# Patient Record
Sex: Female | Born: 1965 | Race: Black or African American | Hispanic: No | Marital: Single | Smoking: Former smoker
Health system: Southern US, Community
[De-identification: ages and names within clinical notes are randomized; demographics above are authoritative.]

## PROBLEM LIST (undated history)

## (undated) DIAGNOSIS — E785 Hyperlipidemia, unspecified: Secondary | ICD-10-CM

## (undated) DIAGNOSIS — C49A2 Gastrointestinal stromal tumor of stomach: Secondary | ICD-10-CM

## (undated) DIAGNOSIS — M545 Low back pain, unspecified: Secondary | ICD-10-CM

## (undated) DIAGNOSIS — G35 Multiple sclerosis: Secondary | ICD-10-CM

## (undated) DIAGNOSIS — C801 Malignant (primary) neoplasm, unspecified: Secondary | ICD-10-CM

## (undated) DIAGNOSIS — I10 Essential (primary) hypertension: Secondary | ICD-10-CM

## (undated) DIAGNOSIS — E559 Vitamin D deficiency, unspecified: Secondary | ICD-10-CM

## (undated) HISTORY — DX: Low back pain: M54.5

## (undated) HISTORY — DX: Low back pain, unspecified: M54.50

## (undated) HISTORY — DX: Vitamin D deficiency, unspecified: E55.9

## (undated) HISTORY — DX: Gastrointestinal stromal tumor of stomach: C49.A2

## (undated) HISTORY — DX: Hyperlipidemia, unspecified: E78.5

---

## 2010-09-02 ENCOUNTER — Other Ambulatory Visit: Payer: Self-pay | Admitting: Neurology

## 2010-09-02 DIAGNOSIS — G35 Multiple sclerosis: Secondary | ICD-10-CM

## 2011-07-11 ENCOUNTER — Other Ambulatory Visit: Payer: Self-pay | Admitting: Neurology

## 2011-07-11 DIAGNOSIS — G35 Multiple sclerosis: Secondary | ICD-10-CM

## 2011-07-22 ENCOUNTER — Ambulatory Visit
Admission: RE | Admit: 2011-07-22 | Discharge: 2011-07-22 | Disposition: A | Discharge status: Home / Self Care | Payer: Medicaid Other | Source: Ambulatory Visit | Attending: Neurology | Admitting: Neurology

## 2011-07-22 DIAGNOSIS — G35 Multiple sclerosis: Secondary | ICD-10-CM

## 2011-07-22 MED ORDER — GADOBENATE DIMEGLUMINE 529 MG/ML IV SOLN
20.0000 mL | Freq: Once | INTRAVENOUS | Status: AC | PRN
  Administered 2011-07-22: 20 mL via INTRAVENOUS

## 2012-04-08 ENCOUNTER — Other Ambulatory Visit: Payer: Self-pay

## 2012-04-08 DIAGNOSIS — Z1231 Encounter for screening mammogram for malignant neoplasm of breast: Secondary | ICD-10-CM

## 2012-05-07 ENCOUNTER — Ambulatory Visit
Admission: RE | Admit: 2012-05-07 | Discharge: 2012-05-07 | Disposition: A | Discharge status: Home / Self Care | Payer: Medicaid Other | Source: Ambulatory Visit

## 2012-05-07 DIAGNOSIS — Z1231 Encounter for screening mammogram for malignant neoplasm of breast: Secondary | ICD-10-CM

## 2012-08-20 ENCOUNTER — Other Ambulatory Visit: Payer: Self-pay | Admitting: Neurology

## 2012-08-31 ENCOUNTER — Other Ambulatory Visit (HOSPITAL_COMMUNITY): Payer: Self-pay | Admitting: Obstetrics and Gynecology

## 2012-08-31 DIAGNOSIS — N971 Female infertility of tubal origin: Secondary | ICD-10-CM

## 2012-08-31 DIAGNOSIS — Z308 Encounter for other contraceptive management: Secondary | ICD-10-CM

## 2012-08-31 DIAGNOSIS — IMO0002 Reserved for concepts with insufficient information to code with codable children: Secondary | ICD-10-CM

## 2012-09-03 ENCOUNTER — Emergency Department (HOSPITAL_COMMUNITY)
Admission: EM | Admit: 2012-09-03 | Discharge: 2012-09-03 | Discharge status: Against Medical Advice | Payer: Medicaid Other | Attending: Emergency Medicine | Admitting: Emergency Medicine

## 2012-09-03 ENCOUNTER — Inpatient Hospital Stay (HOSPITAL_COMMUNITY)
Admission: EM | Admit: 2012-09-03 | Discharge: 2012-09-13 | DRG: 981 | Disposition: A | Discharge status: Home / Self Care | Payer: Medicaid Other | Attending: Internal Medicine | Admitting: Internal Medicine

## 2012-09-03 ENCOUNTER — Emergency Department (HOSPITAL_COMMUNITY): Payer: Medicaid Other

## 2012-09-03 ENCOUNTER — Encounter (HOSPITAL_COMMUNITY): Payer: Self-pay

## 2012-09-03 ENCOUNTER — Encounter (HOSPITAL_COMMUNITY): Payer: Self-pay | Admitting: *Deleted

## 2012-09-03 DIAGNOSIS — R000 Tachycardia, unspecified: Secondary | ICD-10-CM | POA: Diagnosis present

## 2012-09-03 DIAGNOSIS — Z87891 Personal history of nicotine dependence: Secondary | ICD-10-CM

## 2012-09-03 DIAGNOSIS — R059 Cough, unspecified: Secondary | ICD-10-CM | POA: Diagnosis not present

## 2012-09-03 DIAGNOSIS — E785 Hyperlipidemia, unspecified: Secondary | ICD-10-CM | POA: Diagnosis present

## 2012-09-03 DIAGNOSIS — N179 Acute kidney failure, unspecified: Secondary | ICD-10-CM | POA: Diagnosis not present

## 2012-09-03 DIAGNOSIS — D72829 Elevated white blood cell count, unspecified: Secondary | ICD-10-CM | POA: Diagnosis present

## 2012-09-03 DIAGNOSIS — E43 Unspecified severe protein-calorie malnutrition: Secondary | ICD-10-CM | POA: Diagnosis present

## 2012-09-03 DIAGNOSIS — I10 Essential (primary) hypertension: Secondary | ICD-10-CM | POA: Insufficient documentation

## 2012-09-03 DIAGNOSIS — Z3202 Encounter for pregnancy test, result negative: Secondary | ICD-10-CM | POA: Insufficient documentation

## 2012-09-03 DIAGNOSIS — G35 Multiple sclerosis: Secondary | ICD-10-CM | POA: Insufficient documentation

## 2012-09-03 DIAGNOSIS — R55 Syncope and collapse: Secondary | ICD-10-CM | POA: Insufficient documentation

## 2012-09-03 DIAGNOSIS — K8020 Calculus of gallbladder without cholecystitis without obstruction: Secondary | ICD-10-CM | POA: Diagnosis present

## 2012-09-03 DIAGNOSIS — W1809XA Striking against other object with subsequent fall, initial encounter: Secondary | ICD-10-CM | POA: Insufficient documentation

## 2012-09-03 DIAGNOSIS — E876 Hypokalemia: Secondary | ICD-10-CM | POA: Diagnosis present

## 2012-09-03 DIAGNOSIS — Z862 Personal history of diseases of the blood and blood-forming organs and certain disorders involving the immune mechanism: Secondary | ICD-10-CM | POA: Insufficient documentation

## 2012-09-03 DIAGNOSIS — R109 Unspecified abdominal pain: Secondary | ICD-10-CM | POA: Insufficient documentation

## 2012-09-03 DIAGNOSIS — K921 Melena: Secondary | ICD-10-CM | POA: Diagnosis present

## 2012-09-03 DIAGNOSIS — Y939 Activity, unspecified: Secondary | ICD-10-CM | POA: Insufficient documentation

## 2012-09-03 DIAGNOSIS — Z8639 Personal history of other endocrine, nutritional and metabolic disease: Secondary | ICD-10-CM | POA: Insufficient documentation

## 2012-09-03 DIAGNOSIS — S0990XA Unspecified injury of head, initial encounter: Secondary | ICD-10-CM | POA: Insufficient documentation

## 2012-09-03 DIAGNOSIS — D4819 Other specified neoplasm of uncertain behavior of connective and other soft tissue: Principal | ICD-10-CM | POA: Diagnosis present

## 2012-09-03 DIAGNOSIS — D500 Iron deficiency anemia secondary to blood loss (chronic): Secondary | ICD-10-CM | POA: Diagnosis present

## 2012-09-03 DIAGNOSIS — K922 Gastrointestinal hemorrhage, unspecified: Secondary | ICD-10-CM

## 2012-09-03 DIAGNOSIS — R05 Cough: Secondary | ICD-10-CM | POA: Diagnosis not present

## 2012-09-03 DIAGNOSIS — R630 Anorexia: Secondary | ICD-10-CM | POA: Insufficient documentation

## 2012-09-03 DIAGNOSIS — R42 Dizziness and giddiness: Secondary | ICD-10-CM | POA: Insufficient documentation

## 2012-09-03 DIAGNOSIS — G35D Multiple sclerosis, unspecified: Secondary | ICD-10-CM

## 2012-09-03 DIAGNOSIS — R112 Nausea with vomiting, unspecified: Secondary | ICD-10-CM | POA: Diagnosis present

## 2012-09-03 DIAGNOSIS — K319 Disease of stomach and duodenum, unspecified: Secondary | ICD-10-CM | POA: Diagnosis present

## 2012-09-03 DIAGNOSIS — Z79899 Other long term (current) drug therapy: Secondary | ICD-10-CM | POA: Insufficient documentation

## 2012-09-03 DIAGNOSIS — K3189 Other diseases of stomach and duodenum: Secondary | ICD-10-CM | POA: Diagnosis present

## 2012-09-03 DIAGNOSIS — K259 Gastric ulcer, unspecified as acute or chronic, without hemorrhage or perforation: Secondary | ICD-10-CM | POA: Diagnosis present

## 2012-09-03 DIAGNOSIS — D481 Neoplasm of uncertain behavior of connective and other soft tissue: Principal | ICD-10-CM | POA: Diagnosis present

## 2012-09-03 DIAGNOSIS — I959 Hypotension, unspecified: Secondary | ICD-10-CM | POA: Diagnosis present

## 2012-09-03 DIAGNOSIS — Y9289 Other specified places as the place of occurrence of the external cause: Secondary | ICD-10-CM | POA: Insufficient documentation

## 2012-09-03 DIAGNOSIS — K5900 Constipation, unspecified: Secondary | ICD-10-CM | POA: Insufficient documentation

## 2012-09-03 DIAGNOSIS — D62 Acute posthemorrhagic anemia: Secondary | ICD-10-CM | POA: Diagnosis present

## 2012-09-03 DIAGNOSIS — D649 Anemia, unspecified: Secondary | ICD-10-CM | POA: Diagnosis present

## 2012-09-03 HISTORY — DX: Hyperlipidemia, unspecified: E78.5

## 2012-09-03 HISTORY — DX: Multiple sclerosis: G35

## 2012-09-03 HISTORY — DX: Essential (primary) hypertension: I10

## 2012-09-03 LAB — COMPREHENSIVE METABOLIC PANEL
ALT: 6 U/L (ref 0–35)
AST: 11 U/L (ref 0–37)
Alkaline Phosphatase: 41 U/L (ref 39–117)
CO2: 26 mEq/L (ref 19–32)
Calcium: 8.5 mg/dL (ref 8.4–10.5)
Chloride: 102 mEq/L (ref 96–112)
GFR calc non Af Amer: 62 mL/min — ABNORMAL LOW (ref >90)
Potassium: 3.6 mEq/L (ref 3.5–5.1)
Sodium: 136 mEq/L (ref 135–145)
Total Bilirubin: 0.3 mg/dL (ref 0.3–1.2)

## 2012-09-03 LAB — CBC WITH DIFFERENTIAL/PLATELET
Basophils Absolute: 0 10*3/uL (ref 0.0–0.1)
Lymphocytes Relative: 13 % (ref 12–46)
Neutro Abs: 5 10*3/uL (ref 1.7–7.7)
Platelets: 205 10*3/uL (ref 150–400)
RDW: 13 % (ref 11.5–15.5)
WBC: 6.1 10*3/uL (ref 4.0–10.5)

## 2012-09-03 LAB — URINALYSIS, DIPSTICK ONLY
Bilirubin Urine: NEGATIVE
Ketones, ur: NEGATIVE mg/dL
Nitrite: NEGATIVE
pH: 6 (ref 5.0–8.0)

## 2012-09-03 LAB — POCT PREGNANCY, URINE: Preg Test, Ur: NEGATIVE

## 2012-09-03 NOTE — ED Notes (Signed)
Pt unable urinate at time. Pt. Aware of giving urine specimen.  Nurse was notified.

## 2012-09-03 NOTE — ED Notes (Addendum)
Pt wanst to leave AMA states she is not from here and have to get home to give her son medication. She will like to leave now and come back later. PA aware. Pt aware of importance of staying for medical treatment.

## 2012-09-03 NOTE — ED Notes (Signed)
Per EMS pt had syncopal episode at grocery store today. Just prior to syncopal episode she vomited. Pt fell into her son and to the ground but does not c/o of pain from fall. Dark brown emesis noted by EMS. Waiting to see primary care doctor.

## 2012-09-03 NOTE — ED Notes (Signed)
Pt reports earlier today she walked from her apartment to the store, pt came out of the store, experienced an episode of n/v and then reports a syncopal episode w/ a +LOC for approx in duration. Pt states she fell to the ground and hit the back of her head during her syncopal episode. Pt is A&Ox4, in no acute distress at present. Pt states she was seen here at this facility at time of event and had a CT scan however had to leave to give her son medication.

## 2012-09-03 NOTE — ED Provider Notes (Signed)
CSN: [id number]     Arrival date & time 09/03/12  1717 History     None    Chief Complaint  Patient presents with  . Near Syncope    HPI  Monica [name] is a 47 year old female with a PMH of HTN, HLD, and MS who presents to the ED for evaluation of near syncope.  Patient states that at approximately 5:00 this evening she was at the grocery store when she suddenly became lightheaded. She states she felt nauseated and had a large episode of emesis. She then had a syncopal episode and fell backwards hitting the back of her head on the concrete.  She denies any injuries from her fall.  Her son was present and states that she was unconscious for approximately 1 minute.  Patient states that she is currently asymptomatic and had no lightheadedness, palpitations, dizziness, weakness, lightheadedness, numbness/tingling, headache, nausea, or abdominal pain.   She states that she has not been eating or drinking very much because every time she eats something it causes her to have increased abdominal pain.  Her abdominal pain is located in the epigastric region and is intermittent.  She also reports dark stools for the past 2 days. She states that she has been constipated and has been taking stool softeners. She states that she's been taking ibuprofen for her pain. She otherwise has been well with no recent fever, chills, rhinorrhea, congestion, sore throat, shortness of breath, chest pain, dysuria, hematuria, vaginal bleeding, vaginal discharge, or leg edema.  LNMP two weeks ago.      Past Medical History  Diagnosis Date  . Hypertension   . Hyperlipidemia   . MS (multiple sclerosis)    History reviewed. No pertinent past surgical history. No family history on file. History  Substance Use Topics  . Smoking status: Former Games developer  . Smokeless tobacco: Never Used  . Alcohol Use: No   OB History   Grav Para Term Preterm Abortions TAB SAB Ect Mult Living                 Review of Systems   Constitutional: Positive for appetite change (decreased ). Negative for fever, diaphoresis, activity change and fatigue.  HENT: Negative for ear pain, congestion, sore throat, rhinorrhea, neck pain, neck stiffness and dental problem.   Eyes: Negative for photophobia and visual disturbance.  Respiratory: Negative for cough, shortness of breath and wheezing.   Cardiovascular: Negative for chest pain, palpitations and leg swelling.  Gastrointestinal: Positive for nausea, vomiting, abdominal pain and constipation. Negative for diarrhea, blood in stool and anal bleeding.  Genitourinary: Negative for dysuria, hematuria, decreased urine volume, vaginal bleeding, vaginal discharge, genital sores and vaginal pain.  Musculoskeletal: Negative for back pain and gait problem.  Skin: Negative for rash and wound.  Neurological: Positive for syncope and light-headedness. Negative for dizziness, tremors, seizures, speech difficulty, weakness, numbness and headaches.    Allergies  Review of patient's allergies indicates no known allergies.  Home Medications   Current Outpatient Rx  Name  Route  Sig  Dispense  Refill  . glatiramer (COPAXONE) 20 MG/ML injection   Subcutaneous   Inject 1 mL (20 mg total) into the skin daily.   3 kit   1    BP 127/69  Pulse 77  Temp(Src) 98.3 F (36.8 C) (Oral)  Resp 23  SpO2 98%  LMP 08/20/2012  Filed Vitals:   09/03/12 1737 09/03/12 1738 09/03/12 1923 09/03/12 2121  BP: 116/68 113/75 138/84 165/88  Pulse: 76 95 75 80  Temp:    99 F (37.2 C)  TempSrc:    Oral  Resp: 30 26 18 16   SpO2: 100%  100% 100%     Physical Exam  Nursing note and vitals reviewed. Constitutional: She is oriented to person, place, and time. She appears well-developed and well-nourished. No distress.  HENT:  Head: Normocephalic and atraumatic.  Right Ear: External ear normal.  Left Ear: External ear normal.  Nose: Nose normal.  Mouth/Throat: Oropharynx is clear and moist. No  oropharyngeal exudate.  No palpable hematomas or lacerations to the scalp throughout.   No hemotympanum bilaterally.    Eyes: Conjunctivae and EOM are normal. Pupils are equal, round, and reactive to light. Right eye exhibits no discharge. Left eye exhibits no discharge.  Neck: Normal range of motion. Neck supple.  No tenderness to palpation to the neck throughout. No limitations of range of motion  Cardiovascular: Normal rate, regular rhythm, normal heart sounds and intact distal pulses.  Exam reveals no gallop and no friction rub.   No murmur heard. Pulmonary/Chest: Effort normal and breath sounds normal. No respiratory distress. She has no wheezes. She has no rales. She exhibits no tenderness.  Abdominal: Soft. Bowel sounds are normal. She exhibits no distension and no mass. There is tenderness. There is no rebound and no guarding.  Mild epigastric tenderness to palpation  Musculoskeletal: Normal range of motion. She exhibits no edema and no tenderness.  Neurological: She is alert and oriented to person, place, and time.  GCS 15.  No focal neurological deficits. No cranial nerve deficits. Gross sensation intact in the upper and lower extremities.  Skin: Skin is warm and dry. She is not diaphoretic.    ED Course   Procedures (including critical care time)  Labs Reviewed - No data to display No results found. 1. Syncope   2. Head injury, acute, initial encounter   3. GI bleeding     Results for orders placed during the hospital encounter of 09/03/12  GLUCOSE, CAPILLARY      Result Value Range   Glucose-Capillary 95  70 - 99 mg/dL  OCCULT BLOOD, POC DEVICE      Result Value Range   Fecal Occult Bld POSITIVE (*) NEGATIVE   Results for orders placed during the hospital encounter of 09/03/12  CBC WITH DIFFERENTIAL      Result Value Range   WBC 6.1  4.0 - 10.5 K/uL   RBC 2.66 (*) 3.87 - 5.11 MIL/uL   Hemoglobin 8.0 (*) 12.0 - 15.0 g/dL   HCT 13.0 (*) 86.5 - 78.4 %   MCV 91.4   78.0 - 100.0 fL   MCH 30.1  26.0 - 34.0 pg   MCHC 32.9  30.0 - 36.0 g/dL   RDW 69.6  29.5 - 28.4 %   Platelets 205  150 - 400 K/uL   Neutrophils Relative % 82 (*) 43 - 77 %   Neutro Abs 5.0  1.7 - 7.7 K/uL   Lymphocytes Relative 13  12 - 46 %   Lymphs Abs 0.8  0.7 - 4.0 K/uL   Monocytes Relative 5  3 - 12 %   Monocytes Absolute 0.3  0.1 - 1.0 K/uL   Eosinophils Relative 0  0 - 5 %   Eosinophils Absolute 0.0  0.0 - 0.7 K/uL   Basophils Relative 0  0 - 1 %   Basophils Absolute 0.0  0.0 - 0.1 K/uL  COMPREHENSIVE METABOLIC PANEL  Result Value Range   Sodium 136  135 - 145 mEq/L   Potassium 3.6  3.5 - 5.1 mEq/L   Chloride 102  96 - 112 mEq/L   CO2 26  19 - 32 mEq/L   Glucose, Bld 115 (*) 70 - 99 mg/dL   BUN 8  6 - 23 mg/dL   Creatinine, Ser 1.61  0.50 - 1.10 mg/dL   Calcium 8.5  8.4 - 09.6 mg/dL   Total Protein 6.5  6.0 - 8.3 g/dL   Albumin 3.4 (*) 3.5 - 5.2 g/dL   AST 11  0 - 37 U/L   ALT 6  0 - 35 U/L   Alkaline Phosphatase 41  39 - 117 U/L   Total Bilirubin 0.3  0.3 - 1.2 mg/dL   GFR calc non Af Amer 62 (*) >90 mL/min   GFR calc Af Amer 72 (*) >90 mL/min  LIPASE, BLOOD      Result Value Range   Lipase 98 (*) 11 - 59 U/L  URINALYSIS, DIPSTICK ONLY      Result Value Range   Specific Gravity, Urine 1.014  1.005 - 1.030   pH 6.0  5.0 - 8.0   Glucose, UA NEGATIVE  NEGATIVE mg/dL   Hgb urine dipstick NEGATIVE  NEGATIVE   Bilirubin Urine NEGATIVE  NEGATIVE   Ketones, ur NEGATIVE  NEGATIVE mg/dL   Protein, ur 045 (*) NEGATIVE mg/dL   Urobilinogen, UA 1.0  0.0 - 1.0 mg/dL   Nitrite NEGATIVE  NEGATIVE   Leukocytes, UA NEGATIVE  NEGATIVE  GLUCOSE, CAPILLARY      Result Value Range   Glucose-Capillary 95  70 - 99 mg/dL  OCCULT BLOOD, POC DEVICE      Result Value Range   Fecal Occult Bld POSITIVE (*) NEGATIVE  POCT PREGNANCY, URINE      Result Value Range   Preg Test, Ur NEGATIVE  NEGATIVE     Date: 09/03/2012  Rate: 77  Rhythm: normal sinus rhythm  QRS Axis:  normal  Intervals: normal  ST/T Wave abnormalities: T wave inversion in leads II, aVF, V1, V2, V3, V4,   Conduction Disutrbances:none  Narrative Interpretation:   Old EKG Reviewed: none available   MDM  Monica [name] is a 47 year old female with a PMH of HTN, HLD, and MS who presents to the ED for evaluation of near syncope.  CBC, CMP, UA, urine pregnancy, occult blood stool, lipase, blood glucose, CT head, and EKG ordered to further evaluate syncopal episode. 1L IV fluids already running from EMS.     Rechecks  7:30 PM = patient resting comfortably in bed. She states she is no dizziness, lightheadedness, or abdominal pain 9:30 PM = Spoke with patient about admission.  She does not want to be admitted because she has to take care of her son.  Spoke with patient regarding consequences about leaving and she confirmed these including death and disability.  Patient signed out AMA   Etiology of syncopal episode possibly due to a GI bleed.  Patient's blood occult stool was positive and she had evidence of anemia without known cause.  Patient remained asymptomatic throughout her emergency department visit. Her EKG showed T wave inversions with no ST elevation.  Her head CT was negative for an acute intracranial abnormality. She had no neurological deficits on exam.  Patient signed out AMA.  She is instructed to return to the emergency department if she develops any severe headache, weakness, lightheadedness, repeated vomiting, fever, severe  abdominal pain, or other concerns.  She was instructed to follow-up with her PCP as soon as possible.  She was in agreement with discharge and plan.      Final impressions: 1. Syncope  2. Head injury  3. GI bleed    Greer Ee Meline Russaw PA-C    Jillyn Ledger, PA-C 09/03/12 2145

## 2012-09-04 ENCOUNTER — Encounter (HOSPITAL_COMMUNITY): Payer: Self-pay | Admitting: Internal Medicine

## 2012-09-04 ENCOUNTER — Encounter (HOSPITAL_COMMUNITY): Admission: EM | Disposition: A | Discharge status: Home / Self Care | Payer: Self-pay | Source: Home / Self Care

## 2012-09-04 ENCOUNTER — Inpatient Hospital Stay (HOSPITAL_COMMUNITY): Payer: Medicaid Other

## 2012-09-04 DIAGNOSIS — G35D Multiple sclerosis, unspecified: Secondary | ICD-10-CM

## 2012-09-04 DIAGNOSIS — R55 Syncope and collapse: Secondary | ICD-10-CM

## 2012-09-04 DIAGNOSIS — K319 Disease of stomach and duodenum, unspecified: Secondary | ICD-10-CM

## 2012-09-04 DIAGNOSIS — K259 Gastric ulcer, unspecified as acute or chronic, without hemorrhage or perforation: Secondary | ICD-10-CM

## 2012-09-04 DIAGNOSIS — I10 Essential (primary) hypertension: Secondary | ICD-10-CM

## 2012-09-04 DIAGNOSIS — K922 Gastrointestinal hemorrhage, unspecified: Secondary | ICD-10-CM | POA: Diagnosis present

## 2012-09-04 DIAGNOSIS — E785 Hyperlipidemia, unspecified: Secondary | ICD-10-CM | POA: Diagnosis present

## 2012-09-04 DIAGNOSIS — D649 Anemia, unspecified: Secondary | ICD-10-CM | POA: Diagnosis present

## 2012-09-04 DIAGNOSIS — D62 Acute posthemorrhagic anemia: Secondary | ICD-10-CM | POA: Diagnosis present

## 2012-09-04 DIAGNOSIS — Z8669 Personal history of other diseases of the nervous system and sense organs: Secondary | ICD-10-CM

## 2012-09-04 DIAGNOSIS — K3189 Other diseases of stomach and duodenum: Secondary | ICD-10-CM | POA: Diagnosis present

## 2012-09-04 HISTORY — PX: ESOPHAGOGASTRODUODENOSCOPY: SHX5428

## 2012-09-04 LAB — CBC
HCT: 21.7 % — ABNORMAL LOW (ref 36.0–46.0)
MCH: 29.1 pg (ref 26.0–34.0)
MCH: 29.5 pg (ref 26.0–34.0)
MCHC: 32.3 g/dL (ref 30.0–36.0)
MCHC: 32.4 g/dL (ref 30.0–36.0)
MCV: 90.4 fL (ref 78.0–100.0)
MCV: 90.8 fL (ref 78.0–100.0)
Platelets: 204 10*3/uL (ref 150–400)
Platelets: 198 10*3/uL (ref 150–400)
RBC: 2.61 MIL/uL — ABNORMAL LOW (ref 3.87–5.11)
RBC: 2.44 MIL/uL — ABNORMAL LOW (ref 3.87–5.11)
RDW: 13 % (ref 11.5–15.5)

## 2012-09-04 LAB — PREPARE RBC (CROSSMATCH)

## 2012-09-04 LAB — ABO/RH: ABO/RH(D): O POS

## 2012-09-04 LAB — BASIC METABOLIC PANEL
Calcium: 8.4 mg/dL (ref 8.4–10.5)
GFR calc non Af Amer: 75 mL/min — ABNORMAL LOW (ref >90)
Sodium: 138 mEq/L (ref 135–145)

## 2012-09-04 LAB — GLUCOSE, CAPILLARY: Glucose-Capillary: 115 mg/dL — ABNORMAL HIGH (ref 70–99)

## 2012-09-04 LAB — HEPATIC FUNCTION PANEL
Albumin: 3 g/dL — ABNORMAL LOW (ref 3.5–5.2)
Total Protein: 5.6 g/dL — ABNORMAL LOW (ref 6.0–8.3)

## 2012-09-04 SURGERY — EGD (ESOPHAGOGASTRODUODENOSCOPY)
Anesthesia: Moderate Sedation

## 2012-09-04 MED ORDER — SODIUM CHLORIDE 0.9 % IJ SOLN
3.0000 mL | Freq: Two times a day (BID) | INTRAMUSCULAR | Status: DC
  Administered 2012-09-04 – 2012-09-12 (×11): 3 mL via INTRAVENOUS

## 2012-09-04 MED ORDER — IOHEXOL 300 MG/ML  SOLN
50.0000 mL | Freq: Once | INTRAMUSCULAR | Status: AC | PRN
  Administered 2012-09-04: 50 mL via ORAL

## 2012-09-04 MED ORDER — ONDANSETRON HCL 4 MG PO TABS
4.0000 mg | ORAL_TABLET | Freq: Four times a day (QID) | ORAL | Status: DC | PRN
  Filled 2012-09-04: qty 1

## 2012-09-04 MED ORDER — PANTOPRAZOLE SODIUM 40 MG IV SOLR
40.0000 mg | Freq: Two times a day (BID) | INTRAVENOUS | Status: DC
  Administered 2012-09-04 – 2012-09-12 (×16): 40 mg via INTRAVENOUS
  Filled 2012-09-04 (×20): qty 40

## 2012-09-04 MED ORDER — SODIUM CHLORIDE 0.9 % IV SOLN
80.0000 mg | Freq: Once | INTRAVENOUS | Status: AC
  Administered 2012-09-04: 80 mg via INTRAVENOUS
  Filled 2012-09-04: qty 80

## 2012-09-04 MED ORDER — IOHEXOL 300 MG/ML  SOLN
100.0000 mL | Freq: Once | INTRAMUSCULAR | Status: AC | PRN
  Administered 2012-09-04: 100 mL via INTRAVENOUS

## 2012-09-04 MED ORDER — HYDRALAZINE HCL 20 MG/ML IJ SOLN: 10.0000 mg | INTRAMUSCULAR | Status: DC | PRN

## 2012-09-04 MED ORDER — BUTAMBEN-TETRACAINE-BENZOCAINE 2-2-14 % EX AERO
INHALATION_SPRAY | CUTANEOUS | Status: DC | PRN
  Administered 2012-09-04: 1 via TOPICAL

## 2012-09-04 MED ORDER — SIMVASTATIN 10 MG PO TABS
10.0000 mg | ORAL_TABLET | Freq: Every day | ORAL | Status: DC
  Administered 2012-09-04 – 2012-09-06 (×3): 10 mg via ORAL
  Filled 2012-09-04 (×4): qty 1

## 2012-09-04 MED ORDER — FENTANYL CITRATE 0.05 MG/ML IJ SOLN
INTRAMUSCULAR | Status: DC | PRN
  Administered 2012-09-04 (×3): 25 ug via INTRAVENOUS

## 2012-09-04 MED ORDER — MIDAZOLAM HCL 10 MG/2ML IJ SOLN
INTRAMUSCULAR | Status: DC | PRN
  Administered 2012-09-04 (×3): 2 mg via INTRAVENOUS

## 2012-09-04 MED ORDER — ONDANSETRON HCL 4 MG/2ML IJ SOLN
4.0000 mg | Freq: Four times a day (QID) | INTRAMUSCULAR | Status: DC | PRN
  Administered 2012-09-05 – 2012-09-06 (×2): 4 mg via INTRAVENOUS
  Filled 2012-09-04 (×3): qty 2

## 2012-09-04 MED ORDER — SODIUM CHLORIDE 0.9 % IV SOLN
INTRAVENOUS | Status: DC
  Administered 2012-09-05 – 2012-09-07 (×2): via INTRAVENOUS

## 2012-09-04 MED ORDER — PANTOPRAZOLE SODIUM 40 MG IV SOLR
8.0000 mg/h | INTRAVENOUS | Status: DC
  Administered 2012-09-04 (×2): 8 mg/h via INTRAVENOUS
  Filled 2012-09-04 (×4): qty 80

## 2012-09-04 MED ORDER — GLATIRAMER ACETATE 20 MG/ML ~~LOC~~ KIT: 20.0000 mg | PACK | Freq: Every day | SUBCUTANEOUS | Status: DC

## 2012-09-04 MED ORDER — SODIUM CHLORIDE 0.9 % IV SOLN
INTRAVENOUS | Status: AC
  Administered 2012-09-04 (×2): via INTRAVENOUS

## 2012-09-04 MED ORDER — PANTOPRAZOLE SODIUM 40 MG IV SOLR: 40.0000 mg | Freq: Two times a day (BID) | INTRAVENOUS | Status: DC

## 2012-09-04 NOTE — Op Note (Signed)
[facility] [address] [place], [address]   ENDOSCOPY PROCEDURE REPORT  PATIENT: [name], [name]  MR#: [id number] BIRTHDATE: [birth year] , 46  yrs. old GENDER: Female ENDOSCOPIST: Beverley Fiedler, MD REFERRED BY:  [facility] PROCEDURE DATE:  09/04/2012 PROCEDURE:  EGD, diagnostic ASA CLASS:     Class II INDICATIONS:  abdominal pain in upper left quadrant.   Acute post hemorrhagic anemia.   abnormal ultrasound of the GI tract,. Melena. MEDICATIONS: These medications were titrated to patient response per physician's verbal order, Fentanyl 75 mcg IV, and Versed 5 mg IV TOPICAL ANESTHETIC: Cetacaine Spray  DESCRIPTION OF PROCEDURE: After the risks benefits and alternatives of the procedure were thoroughly explained, informed consent was obtained.  The Pentax Gastroscope [id number] endoscope was introduced through the mouth and advanced to the second portion of the duodenum. Without limitations.  The instrument was slowly withdrawn as the mucosa was fully examined.      ESOPHAGUS: The mucosa of the esophagus appeared normal.  STOMACH: A large submucosal mass (vs. extrinsic compression) was seen in the body of the stomach.  Query GIST. There is an ulcerated portion to this mass in the gastric body.  There was no visible vessel but oozing around the edges with contact.  This is felt to be the source of blood loss.  This mass causes significant luminal narrowing in the mid stomach.  The distal stomach/antrum appears normal. There was no blood or heme present on entering the stomach.   DUODENUM: The duodenal mucosa showed no abnormalities in the bulb and second portion of the duodenum.  Retroflexed views revealed as previously described.     The scope was then withdrawn from the patient and the procedure completed.  COMPLICATIONS: There were no complications . ENDOSCOPIC IMPRESSION: 1.   The mucosa of the esophagus appeared normal 2.   Gastric submucosal  mass (favored over extrinsic compression) was found in the gastric body with ulceration at the center (most likely source of bleeding/anemia) 3.   The duodenal mucosa showed no abnormalities in the bulb and second portion of the duodenum  RECOMMENDATIONS: 1.  Contrast-enhanced CT scan of the abdomen and pelvis for further characterization of upper abdominal mass 2.  Twice a day IV PPI 3.  Clear liquid diet for now 4.  Surgical consultation, as endoscopic therapy very unlikely to be beneficial in this case 5.  Closely monitor hemoglobin, transfuse if necessary  eSigned:  Beverley Fiedler, MD 09/04/2012 4:33 PM   CC:The Patient  PATIENT NAME:  Monica [name], Monica [name] MR#: [id number]

## 2012-09-04 NOTE — ED Provider Notes (Signed)
Medical screening examination/treatment/procedure(s) were performed by non-physician practitioner and as supervising physician I was immediately available for consultation/collaboration.   Michale Emmerich E Jemma Rasp, MD 09/04/12 1025 

## 2012-09-04 NOTE — Progress Notes (Signed)
[facility] PROGRESS NOTE  Monica [name] WUJ:[id number] DOB: 1965-07-27 DOA: 09/03/2012 PCP: No primary provider on file.  Brief narrative: 47 y.o. female with history of multiple sclerosis, hypertension and hyperlipidemia who presented to [facility] ED status post syncopal event on the day of the admission. Patient reported not having similar events in past. She reported having nausea and non bloody vomiting associated with dark stool.for past 1-2 days prior to this admission  Pt reported she does not take aspirin but occasionally takes ibuprofen.  In ED, pt remained hemodynamically stable. CBC revealed hemoglobin of 8 which trended down to 7. GI consulted for possible EGD and/or colonoscopy.  Assessment/Plan:  Principal Problem:   Syncope - likely secondary to upper GI bleed - pt feels better this morning - no acute intracranial findings on CT head - continue supportive care with IV fluids, NS @ 75 cc/hr Active Problems:   Acute blood loss anemia - likely due to upper GI bleed - will start 2 units PRBC - appreciate GI consult - will continue protonix drip until seen by GI - keep NPO   History of multiple sclerosis - on glatiramer   HTN (hypertension) - hydralazine PRN   Hyperlipidemia - continue simvastatin   Code Status: full code Family Communication: no family at the bedside Disposition Plan: home when stable   Manson Passey, MD  [facility] Pager [phone]  If 7PM-7AM, please contact night-coverage [email or web address] Password [email or web address] 09/04/2012, 6:38 AM   LOS: 1 day   Consultants:  Gastroenterology   Procedures:  None   Antibiotics:  None   HPI/Subjective: No acute overnight events.  Objective: Filed Vitals:   09/03/12 2308 09/04/12 0221 09/04/12 0247 09/04/12 0521  BP: 138/89 144/93 148/85 142/88  Pulse: 75 64 67 65  Temp: 99.1 F (37.3 C) 98.1 F (36.7 C) 97.6 F (36.4 C) 98.2 F (36.8 C)  TempSrc: Oral Oral Oral Oral  Resp: 18 18 20 16   Height:   5\' 8"   (1.727 m)   Weight:   95.3 kg (210 lb 1.6 oz)   SpO2: 95% 100% 100% 100%    Intake/Output Summary (Last 24 hours) at 09/04/12 8657 Last data filed at 09/04/12 0449  Gross per 24 hour  Intake      0 ml  Output    500 ml  Net   -500 ml    Exam:   General:  Pt is alert, follows commands appropriately, not in acute distress  Cardiovascular: Regular rate and rhythm, S1/S2, no murmurs, no rubs, no gallops  Respiratory: Clear to auscultation bilaterally, no wheezing, no crackles, no rhonchi  Abdomen: Soft, non tender, non distended, bowel sounds present, no guarding  Extremities: No edema, pulses DP and PT palpable bilaterally  Neuro: Grossly nonfocal  Data Reviewed: Basic Metabolic Panel:  Recent Labs Lab 09/03/12 1918  NA 136  K 3.6  CL 102  CO2 26  GLUCOSE 115*  BUN 8  CREATININE 1.07  CALCIUM 8.5   Liver Function Tests:  Recent Labs Lab 09/03/12 1918  AST 11  ALT 6  ALKPHOS 41  BILITOT 0.3  PROT 6.5  ALBUMIN 3.4*    Recent Labs Lab 09/03/12 1918  LIPASE 98*   No results found for this basename: AMMONIA,  in the last 168 hours CBC:  Recent Labs Lab 09/03/12 1918 09/04/12 0215  WBC 6.1 5.2  NEUTROABS 5.0  --   HGB 8.0* 7.6*  HCT 24.3* 23.6*  MCV 91.4 90.4  PLT 205 204   Cardiac  Enzymes: No results found for this basename: CKTOTAL, CKMB, CKMBINDEX, TROPONINI,  in the last 168 hours BNP: No components found with this basename: POCBNP,  CBG:  Recent Labs Lab 09/03/12 1817  GLUCAP 95    No results found for this or any previous visit (from the past 240 hour(s)).   Studies: Ct Head Wo Contrast 09/03/2012   * IMPRESSION:  1.  No acute intracranial abnormality. 2.  White matter changes in the cerebral hemispheres as noted on the prior MR examination, consistent with the given history of multiple sclerosis.   Original Report Authenticated By: Hulan Saas, M.D.    Scheduled Meds: . glatiramer  20 mg Subcutaneous Daily  . [START ON  09/07/2012] pantoprazole (PROTONIX) IV  40 mg Intravenous Q12H  . sodium chloride  3 mL Intravenous Q12H   Continuous Infusions: . sodium chloride 75 mL/hr at 09/04/12 0349  . pantoprozole (PROTONIX) infusion 8 mg/hr (09/04/12 0254)

## 2012-09-04 NOTE — H&P (Signed)
[facility] History and Physical  Monica [name] OZH:[id number] DOB: June 14, 1965 DOA: 09/03/2012  Referring physician: ER physician. PCP: No primary provider on file. primary care is in [place].  Chief Complaint: Loss of consciousness.  HPI: Monica [name] is a 47 y.o. female history of multiple sclerosis, hypertension and hyperlipidemia was shopping last evening when suddenly she was consciousness. Patient was consciousness for around a minute did not have any associated incontinence of urine or tongue bite. Patient regained consciousness spontaneously. Presented to the ER. Patient states that over the last 2-3 days she's been feeling dizzy and has been having some abdominal discomfort with nausea vomiting. Her nausea vomiting was particularly when she eats and has been having some dark-colored vomitus and dark-colored stools. Patient okay she takes ibuprofen her last time she took was a week ago. Stool for occult blood was positive and patient's hemoglobin is around 8 and baseline is not known and patient does not recall being anemic. Patient after the initial workup did sign out AMA but returned back. Presently patient is hemodynamically stable and will be admitted for further management. Patient denies any chest pain or shortness of breath.  Review of Systems: As presented in the history of presenting illness, rest negative.  Past Medical History  Diagnosis Date  . Hypertension   . Hyperlipidemia   . MS (multiple sclerosis)    History reviewed. No pertinent past surgical history. Social History:  reports that she has quit smoking. She has never used smokeless tobacco. She reports that she does not drink alcohol or use illicit drugs. Home. where does patient live-- Can do ADLs. Can patient participate in ADLs?  No Known Allergies  History reviewed. No pertinent family history.    Prior to Admission medications   Medication Sig Start Date End Date Taking? Authorizing Provider   glatiramer (COPAXONE) 20 MG/ML injection Inject 1 mL (20 mg total) into the skin daily. 08/20/12  Yes York Spaniel, MD  lisinopril (PRINIVIL,ZESTRIL) 10 MG tablet Take 10 mg by mouth daily.   Yes Historical Provider, MD  simvastatin (ZOCOR) 10 MG tablet Take 10 mg by mouth at bedtime.   Yes Historical Provider, MD   Physical Exam: Filed Vitals:   09/03/12 2308  BP: 138/89  Pulse: 75  Temp: 99.1 F (37.3 C)  TempSrc: Oral  Resp: 18  SpO2: 95%     General:  Well-developed well-nourished.  Eyes: Anicteric mild pallor.  ENT: No discharge from the ears eyes nose mouth.  Neck: No mass felt.  Cardiovascular: S1-S2 heard.  Respiratory: No rhonchi or crepitations.  Abdomen: Soft nontender bowel sounds present.  Skin: No rash.  Musculoskeletal: No edema.  Psychiatric: Appears normal.  Neurologic: Alert awake oriented to time place and person. Moves all extremities.  Labs on Admission:  Basic Metabolic Panel:  Recent Labs Lab 09/03/12 1918  NA 136  K 3.6  CL 102  CO2 26  GLUCOSE 115*  BUN 8  CREATININE 1.07  CALCIUM 8.5   Liver Function Tests:  Recent Labs Lab 09/03/12 1918  AST 11  ALT 6  ALKPHOS 41  BILITOT 0.3  PROT 6.5  ALBUMIN 3.4*    Recent Labs Lab 09/03/12 1918  LIPASE 98*   No results found for this basename: AMMONIA,  in the last 168 hours CBC:  Recent Labs Lab 09/03/12 1918  WBC 6.1  NEUTROABS 5.0  HGB 8.0*  HCT 24.3*  MCV 91.4  PLT 205   Cardiac Enzymes: No results found for this basename:  CKTOTAL, CKMB, CKMBINDEX, TROPONINI,  in the last 168 hours  BNP (last 3 results) No results found for this basename: PROBNP,  in the last 8760 hours CBG:  Recent Labs Lab 09/03/12 1817  GLUCAP 95    Radiological Exams on Admission: Ct Head Wo Contrast  09/03/2012   *RADIOLOGY REPORT*  Clinical Data: Syncope.  Nausea and vomiting.  Current history of multiple sclerosis.  CT HEAD WITHOUT CONTRAST  Technique:  Contiguous axial  images were obtained from the base of the skull through the vertex without contrast.  Comparison: MRI brain 07/22/2011.  Findings: White matter changes in the cerebral hemispheres as noted on the prior MR, consistent with the given history of multiple sclerosis.  Ventricular system normal in size appearance for age, unchanged.  No mass lesion.  No midline shift.  No acute hemorrhage or hematoma.  No extra-axial fluid collections.  No evidence of acute infarction.  No skull fracture or other focal osseous abnormality involving the skull.  Visualized paranasal sinuses, bilateral mastoid air cells, and bilateral middle ear cavities well-aerated.  IMPRESSION:  1.  No acute intracranial abnormality. 2.  White matter changes in the cerebral hemispheres as noted on the prior MR examination, consistent with the given history of multiple sclerosis.   Original Report Authenticated By: Hulan Saas, M.D.    Assessment/Plan Principal Problem:   Syncope Active Problems:   GI bleed   History of multiple sclerosis   HTN (hypertension)   Anemia   Hyperlipidemia   1. Syncope most likely secondary GI bleed - presently patient is hemodynamically stable and monitored in telemetry. Hold antihypertensives suspected GI bleed. Differential for syncope include multiple sclerosis but given the obvious stool for clubbing positive and anemic consider GI bleed as the cause of this time. 2. GI bleed - most likely upper GI. Patient has been placed on Protonix infusion. Closely follow CBC for any worsening of hemoglobin and may need transfusion at that time. Consult GI in a.m. patient is presently hemodynamically stable. 3. Nausea vomiting and abdominal discomfort - most likely secondary to peptic ulcer disease. We'll also check ultrasound of abdomen to make sure there is no abnormal gallbladder pathology. 4. Anemia probably secondary to blood loss - follow CBC. 5. Hypertension - hold antihypertensives for now. Patient will be  on when necessary IV hydralazine for systolic blood pressure more than 160. 6. History of hyperlipidemia - continue statins when patient can resume oral.    Code Status: Full code.  Family Communication: None.  Disposition Plan: Admit to inpatient.    Wisam Siefring N. [facility] Pager [phone].  If 7PM-7AM, please contact night-coverage [email or web address] Password [facility] 09/04/2012, 1:07 AM

## 2012-09-04 NOTE — ED Provider Notes (Signed)
CSN: [id number]     Arrival date & time 09/03/12  2250 History     First MD Initiated Contact with Patient 09/03/12 2328     Chief Complaint  Patient presents with  . Loss of Consciousness  . Nausea  . Emesis   (Consider location/radiation/quality/duration/timing/severity/associated sxs/prior Treatment) HPI Comments: Patient presents emergency department with chief complaint of syncopal episode. The syncopal episode was witnessed, and she was unconscious for approximately 1 minute. She was seen earlier tonight, and had to leave AMA. She stated that she had to give her some medication. She has returned to be admitted, which was the advice given by her previous provider. She states that nothing has changed since leaving. She denies being in pain, but states that she does still dizzy when she walks. She has not taken anything in the past and she left the hospital. Additionally, she states that she has had dark stools for the past 2 days.    The history is provided by the patient. No language interpreter was used.    Past Medical History  Diagnosis Date  . Hypertension   . Hyperlipidemia   . MS (multiple sclerosis)    History reviewed. No pertinent past surgical history. History reviewed. No pertinent family history. History  Substance Use Topics  . Smoking status: Former Games developer  . Smokeless tobacco: Never Used  . Alcohol Use: No   OB History   Grav Para Term Preterm Abortions TAB SAB Ect Mult Living                 Review of Systems  All other systems reviewed and are negative.    Allergies  Review of patient's allergies indicates no known allergies.  Home Medications   Current Outpatient Rx  Name  Route  Sig  Dispense  Refill  . glatiramer (COPAXONE) 20 MG/ML injection   Subcutaneous   Inject 1 mL (20 mg total) into the skin daily.   3 kit   1   . lisinopril (PRINIVIL,ZESTRIL) 10 MG tablet   Oral   Take 10 mg by mouth daily.         . simvastatin (ZOCOR)  10 MG tablet   Oral   Take 10 mg by mouth at bedtime.          BP 138/89  Pulse 75  Temp(Src) 99.1 F (37.3 C) (Oral)  Resp 18  SpO2 95%  LMP 08/20/2012 Physical Exam  Nursing note and vitals reviewed. Constitutional: She is oriented to person, place, and time. She appears well-developed and well-nourished.  HENT:  Head: Normocephalic and atraumatic.  No scalp hematomas or signs of head injury, no hemotympanum  Eyes: Conjunctivae and EOM are normal. Pupils are equal, round, and reactive to light.  Neck: Normal range of motion. Neck supple.  Cardiovascular: Normal rate and regular rhythm.  Exam reveals no gallop and no friction rub.   No murmur heard. Pulmonary/Chest: Effort normal and breath sounds normal. No respiratory distress. She has no wheezes. She has no rales. She exhibits no tenderness.  Abdominal: Soft. Bowel sounds are normal. She exhibits no distension and no mass. There is tenderness. There is no rebound and no guarding.  Epigastric abdominal tenderness, no other focal abdominal tenderness  Musculoskeletal: Normal range of motion. She exhibits no edema and no tenderness.  Moves all extremities  Neurological: She is alert and oriented to person, place, and time.  CN 3-12 intact, sensation and strength intact  Skin: Skin is warm and dry.  Psychiatric: She has a normal mood and affect. Her behavior is normal. Judgment and thought content normal.    ED Course   Procedures (including critical care time)  Labs Reviewed - No data to display Ct Head Wo Contrast  09/03/2012   *RADIOLOGY REPORT*  Clinical Data: Syncope.  Nausea and vomiting.  Current history of multiple sclerosis.  CT HEAD WITHOUT CONTRAST  Technique:  Contiguous axial images were obtained from the base of the skull through the vertex without contrast.  Comparison: MRI brain 07/22/2011.  Findings: White matter changes in the cerebral hemispheres as noted on the prior MR, consistent with the given history  of multiple sclerosis.  Ventricular system normal in size appearance for age, unchanged.  No mass lesion.  No midline shift.  No acute hemorrhage or hematoma.  No extra-axial fluid collections.  No evidence of acute infarction.  No skull fracture or other focal osseous abnormality involving the skull.  Visualized paranasal sinuses, bilateral mastoid air cells, and bilateral middle ear cavities well-aerated.  IMPRESSION:  1.  No acute intracranial abnormality. 2.  White matter changes in the cerebral hemispheres as noted on the prior MR examination, consistent with the given history of multiple sclerosis.   Original Report Authenticated By: Hulan Saas, M.D.   1. GI bleed   2. Syncope     MDM  Patient with syncopal episode, likely secondary to GI bleed. Patient was Hemoccult-positive, and has a hemoglobin of 8. She was seen earlier today, and it was decided that she be admitted, however the patient left AMA because she had to take care of some family business. She was returned to the admitted. She denies any changes in her symptoms since leaving the hospital for the first time.  Discussed the patient with Dr. Micheline Maze, who saw the patient earlier with Junius Argyle, PA-C.  Plan remains the same, to admit the patient.   Roxy Horseman, PA-C 09/04/12 0038  Medical screening examination/treatment/procedure(s) were conducted as a shared visit with non-physician practitioner(s) or resident  and myself.  I personally evaluated the patient during the encounter and agree with the findings and plan unless otherwise indicated.    Syncope with lightheaded/dizzy since leaving ER earlier today.  No stroke hx. Pt decided to be admitted.  No changes since leaving.  No bleeding since.  Hb low last visit.  Vitals unremarkable.  Mild pale skin, RRR no murmurs, lungs clear, CNs intact, coordination and gait normal. Plan for admission, pt agrees.    Enid Skeens, MD 09/04/12 [phone]

## 2012-09-04 NOTE — Consult Note (Signed)
[facility] Consultation  Referring Provider:  [facility]  Primary Care Physician:  No primary provider on file. Primary Gastroenterologist:   none      Reason for Consultation:  GI bleed            HPI:   Monica [name] is a 47 y.o. female admitted after a syncopal episode yesterday. Patient gives a 1-2 days history of nausea, vomiting, and dark stool. Presenting hgb was 9.0, down to 7.2 today. Her baseline hgb is unknown but patient doesn't recall ever being told she was anemic. Her menstrual cycles are normal. Patient gives a one month history of nausea and vomiting associated with significant weight loss. Her stools have been very dark over last couple of days but no hematemesis  Past Medical History  Diagnosis Date  . Hypertension   . Hyperlipidemia   . MS (multiple sclerosis)    FMH: uncle with colon cancer. No gastric cancer  History  Substance Use Topics  . Smoking status: Former Games developer  . Smokeless tobacco: Never Used  . Alcohol Use: No    Prior to Admission medications   Medication Sig Start Date End Date Taking? Authorizing Provider  glatiramer (COPAXONE) 20 MG/ML injection Inject 1 mL (20 mg total) into the skin daily. 08/20/12  Yes York Spaniel, MD  lisinopril (PRINIVIL,ZESTRIL) 10 MG tablet Take 10 mg by mouth daily.   Yes Historical Provider, MD  simvastatin (ZOCOR) 10 MG tablet Take 10 mg by mouth at bedtime.   Yes Historical Provider, MD    Current Facility-Administered Medications  Medication Dose Route Frequency Provider Last Rate Last Dose  . 0.9 %  sodium chloride infusion   Intravenous Continuous Eduard Clos, MD 75 mL/hr at 09/04/12 0349    . glatiramer (COPAXONE) injection 20 mg  20 mg Subcutaneous Daily Eduard Clos, MD      . hydrALAZINE (APRESOLINE) injection 10 mg  10 mg Intravenous Q4H PRN Eduard Clos, MD      . ondansetron [facility]) tablet 4 mg  4 mg Oral Q6H PRN Eduard Clos, MD       Or  .  ondansetron [facility]) injection 4 mg  4 mg Intravenous Q6H PRN Eduard Clos, MD      . pantoprazole (PROTONIX) 80 mg in sodium chloride 0.9 % 250 mL infusion  8 mg/hr Intravenous Continuous Eduard Clos, MD 25 mL/hr at 09/04/12 0254 8 mg/hr at 09/04/12 0254  . [START ON 09/07/2012] pantoprazole (PROTONIX) injection 40 mg  40 mg Intravenous Q12H Eduard Clos, MD      . simvastatin (ZOCOR) tablet 10 mg  10 mg Oral QHS Alison Murray, MD      . sodium chloride 0.9 % injection 3 mL  3 mL Intravenous Q12H Eduard Clos, MD   3 mL at 09/04/12 0300    Allergies as of 09/03/2012  . (No Known Allergies)   Review of Systems:    All systems reviewed and negative except where noted in HPI.   Physical Exam:  Vital signs in last 24 hours: Temp:  [97.6 F (36.4 C)-99.1 F (37.3 C)] 98 F (36.7 C) (08/23 1240) Pulse Rate:  [64-95] 65 (08/23 1240) Resp:  [16-30] 16 (08/23 1240) BP: (113-165)/(68-93) 139/85 mmHg (08/23 1240) SpO2:  [95 %-100 %] 100 % (08/23 0521) Weight:  [210 lb 1.6 oz (95.3 kg)] 210 lb 1.6 oz (95.3 kg) (08/23 0247) Last BM Date: 09/03/12 General:   Pleasant black female  in NAD Head:  Normocephalic and atraumatic. Eyes:   No icterus.   Conjunctiva pink. Ears:  Normal auditory acuity. Neck:  Supple; no masses felt Lungs:  Respirations even and unlabored. Lungs clear to auscultation bilaterally.   No wheezes, crackles, or rhonchi.  Heart:  Regular rate and rhythm;  murmur heard. Abdomen:  Soft, nondistended, moderate LUQ tenderness. LUQ is somewhat firm Normal bowel sounds. No appreciable masses or hepatomegaly.  Msk:  Symmetrical without gross deformities.  Extremities:  Without edema. Neurologic:  Alert and  oriented x4;  grossly normal neurologically. Skin:  Intact without significant lesions or rashes. Cervical Nodes:  No significant cervical adenopathy. Psych:  Alert and cooperative. Normal affect.  LAB RESULTS:  Recent Labs  09/04/12 0215  09/04/12 0659 09/04/12 1059  WBC 5.2 4.0 3.8*  HGB 7.6* 7.0* 7.2*  HCT 23.6* 21.7* 22.2*  PLT 204 183 198   BMET  Recent Labs  09/03/12 1918 09/04/12 0659  NA 136 138  K 3.6 3.4*  CL 102 106  CO2 26 25  GLUCOSE 115* 88  BUN 8 8  CREATININE 1.07 0.91  CALCIUM 8.5 8.4   LFT  Recent Labs  09/04/12 0659  PROT 5.6*  ALBUMIN 3.0*  AST 9  ALT 6  ALKPHOS 35*  BILITOT 0.3  BILIDIR <0.1  IBILI NOT CALCULATED    STUDIES: Ct Head Wo Contrast  09/03/2012   *RADIOLOGY REPORT*  Clinical Data: Syncope.  Nausea and vomiting.  Current history of multiple sclerosis.  CT HEAD WITHOUT CONTRAST  Technique:  Contiguous axial images were obtained from the base of the skull through the vertex without contrast.  Comparison: MRI brain 07/22/2011.  Findings: White matter changes in the cerebral hemispheres as noted on the prior MR, consistent with the given history of multiple sclerosis.  Ventricular system normal in size appearance for age, unchanged.  No mass lesion.  No midline shift.  No acute hemorrhage or hematoma.  No extra-axial fluid collections.  No evidence of acute infarction.  No skull fracture or other focal osseous abnormality involving the skull.  Visualized paranasal sinuses, bilateral mastoid air cells, and bilateral middle ear cavities well-aerated.  IMPRESSION:  1.  No acute intracranial abnormality. 2.  White matter changes in the cerebral hemispheres as noted on the prior MR examination, consistent with the given history of multiple sclerosis.   Original Report Authenticated By: Hulan Saas, M.D.   US Abdomen Complete  09/04/2012   The *RADIOLOGY REPORT*  Clinical Data:  Abdominal pain with nausea vomiting  COMPLETE ABDOMINAL ULTRASOUND  Comparison:  None.  Findings: There is a large midline mass that is hypoechoic but solid measuring 16.7 cm x 9.5 cm x 14.5 cm.  The origin of this mass is unclear on ultrasound.  Gallbladder:  Multiple shadowing stones, largest measuring 2.1  cm. No wall thickening or pericholecystic fluid.  No evidence of acute cholecystitis.  Common bile duct:  Normal in caliber.  Duct measures 4.7 mm.  No duct stones.  Liver:  Mildly heterogeneous in echotexture.  The liver is normal in size.  No mass or focal lesion.  IVC:  Appears normal.  Pancreas:  Non visualized due to overlying bowel gas.  Spleen:  Normal in size echogenicity measuring 6.4 cm  Right Kidney:  Multiple right renal cysts.  The largest lies at the mid pole measuring 3.3 cm.  No solid renal masses, no stones and no hydronephrosis.  The right kidney measures 11.4 cm in length.  Left Kidney:  Small mid pole cyst measuring 7 mm.  No other renal masses, no stones and no hydronephrosis.  Left kidney measures 11.6 cm  Abdominal aorta:  No aneurysm identified.  IMPRESSION: There is a large midline solid abdominal mass.  Recommend follow-up contrast enhanced CT scan of the abdomen pelvis for further evaluation.  No acute findings.  Cholelithiasis without evidence of acute cholecystitis.   Original Report Authenticated By: Amie Portland, M.D.   PREVIOUS ENDOSCOPIES:            none   Impression / Plan:   56. 47 year old female with GI bleed manifested by dark, heme positive stools. Interesting her BUN is normal and she denies hematemesis.  Rule out PUD, neoplasm, severe erosive disease from vomiting. Patient has had nausea and vomiting over the last month associated with significant weight loss. There is a solid abdominal mass on u/s, wonder if this is causing extrinsic compression on her stomach?  Will proceed with EGD today for evaluation. She will likely need CTscan following EGD.   2. Multiple sclerosis. Patient on Copaxone, could this cause lymphoma (abdominal mass on u/s) ?  3. Severe normocytic anemia.  Getting blood at present for hgb of 7.2.   4. Cholelithiasis. Normal CBD. No acute cholecystitis on u/s  Thanks   LOS: 1 day   Willette Cluster  09/04/2012, 1:03 PM
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
## 2012-09-04 NOTE — Consult Note (Signed)
Patient seen, examined, and I agree with the above documentation, including the assessment and plan. Agree with EGD today to examine cause of recent bleeding/heme+ stools.   Abdominal ultrasound shows very large midline abdominal mass which is also palpable on exam. Recommend CT scan abdomen and pelvis for further characterization of this lesion Receiving red cell transfusion now Monitor H&H Further recommendations after endoscopy The nature of the procedure, as well as the risks, benefits, and alternatives were carefully and thoroughly reviewed with the patient. Ample time for discussion and questions allowed. The patient understood, was satisfied, and agreed to proceed.

## 2012-09-05 DIAGNOSIS — E43 Unspecified severe protein-calorie malnutrition: Secondary | ICD-10-CM | POA: Diagnosis present

## 2012-09-05 DIAGNOSIS — D490 Neoplasm of unspecified behavior of digestive system: Secondary | ICD-10-CM

## 2012-09-05 DIAGNOSIS — K922 Gastrointestinal hemorrhage, unspecified: Secondary | ICD-10-CM

## 2012-09-05 LAB — CBC
HCT: 27.5 % — ABNORMAL LOW (ref 36.0–46.0)
MCH: 29.9 pg (ref 26.0–34.0)
MCHC: 33.5 g/dL (ref 30.0–36.0)
RDW: 13.6 % (ref 11.5–15.5)

## 2012-09-05 LAB — GLUCOSE, CAPILLARY
Glucose-Capillary: 90 mg/dL (ref 70–99)
Glucose-Capillary: 104 mg/dL — ABNORMAL HIGH (ref 70–99)

## 2012-09-05 MED ORDER — ACETAMINOPHEN 325 MG PO TABS
650.0000 mg | ORAL_TABLET | Freq: Four times a day (QID) | ORAL | Status: DC | PRN
  Administered 2012-09-05: 650 mg via ORAL
  Filled 2012-09-05 (×2): qty 2

## 2012-09-05 MED FILL — Diphenhydramine HCl Inj 50 MG/ML: INTRAMUSCULAR | Qty: 1 | Status: AC

## 2012-09-05 NOTE — Progress Notes (Signed)
[facility] Progress Note   Subjective  Feels okay today, better than yesterday No further melena today Spoke with surgery today and plans for surgical exploration a large upper abdominal tumor, likely gastric in origin   Objective  Vital signs in last 24 hours: Temp:  [98.2 F (36.8 C)-99.2 F (37.3 C)] 99.2 F (37.3 C) (08/24 0625) Pulse Rate:  [62-93] 77 (08/24 0625) Resp:  [13-23] 16 (08/24 0625) BP: (125-148)/(62-92) 134/76 mmHg (08/24 0625) SpO2:  [97 %-100 %] 97 % (08/24 0625) Last BM Date: 09/03/12 .Gen: awake, alert, NAD HEENT: anicteric, op clear CV: RRR, no mrg Pulm: CTA b/l Abd: soft, upper abd mass with mild tenderness, ND, +BS throughout Ext: no c/c/e Neuro: nonfocal  Lab Results:  Recent Labs  09/04/12 0659 09/04/12 1059 09/05/12 0815  WBC 4.0 3.8* 6.1  HGB 7.0* 7.2* 9.2*  HCT 21.7* 22.2* 27.5*  PLT 183 198 221   BMET  Recent Labs  09/03/12 1918 09/04/12 0659  NA 136 138  K 3.6 3.4*  CL 102 106  CO2 26 25  GLUCOSE 115* 88  BUN 8 8  CREATININE 1.07 0.91  CALCIUM 8.5 8.4   LFT  Recent Labs  09/04/12 0659  PROT 5.6*  ALBUMIN 3.0*  AST 9  ALT 6  ALKPHOS 35*  BILITOT 0.3  BILIDIR <0.1  IBILI NOT CALCULATED   Studies/Results: Ct Head Wo Contrast  09/03/2012   *RADIOLOGY REPORT*  Clinical Data: Syncope.  Nausea and vomiting.  Current history of multiple sclerosis.  CT HEAD WITHOUT CONTRAST  Technique:  Contiguous axial images were obtained from the base of the skull through the vertex without contrast.  Comparison: MRI brain 07/22/2011.  Findings: White matter changes in the cerebral hemispheres as noted on the prior MR, consistent with the given history of multiple sclerosis.  Ventricular system normal in size appearance for age, unchanged.  No mass lesion.  No midline shift.  No acute hemorrhage or hematoma.  No extra-axial fluid collections.  No evidence of acute infarction.  No skull fracture or other focal osseous  abnormality involving the skull.  Visualized paranasal sinuses, bilateral mastoid air cells, and bilateral middle ear cavities well-aerated.  IMPRESSION:  1.  No acute intracranial abnormality. 2.  White matter changes in the cerebral hemispheres as noted on the prior MR examination, consistent with the given history of multiple sclerosis.   Original Report Authenticated By: Hulan Saas, M.D.   US Abdomen Complete  09/04/2012   The *RADIOLOGY REPORT*  Clinical Data:  Abdominal pain with nausea vomiting  COMPLETE ABDOMINAL ULTRASOUND  Comparison:  None.  Findings: There is a large midline mass that is hypoechoic but solid measuring 16.7 cm x 9.5 cm x 14.5 cm.  The origin of this mass is unclear on ultrasound.  Gallbladder:  Multiple shadowing stones, largest measuring 2.1 cm. No wall thickening or pericholecystic fluid.  No evidence of acute cholecystitis.  Common bile duct:  Normal in caliber.  Duct measures 4.7 mm.  No duct stones.  Liver:  Mildly heterogeneous in echotexture.  The liver is normal in size.  No mass or focal lesion.  IVC:  Appears normal.  Pancreas:  Non visualized due to overlying bowel gas.  Spleen:  Normal in size echogenicity measuring 6.4 cm  Right Kidney:  Multiple right renal cysts.  The largest lies at the mid pole measuring 3.3 cm.  No solid renal masses, no stones and no hydronephrosis.  The right kidney measures 11.4 cm in length.  Left Kidney:  Small mid pole cyst measuring 7 mm.  No other renal masses, no stones and no hydronephrosis.  Left kidney measures 11.6 cm  Abdominal aorta:  No aneurysm identified.  IMPRESSION: There is a large midline solid abdominal mass.  Recommend follow-up contrast enhanced CT scan of the abdomen pelvis for further evaluation.  No acute findings.  Cholelithiasis without evidence of acute cholecystitis.   Original Report Authenticated By: Amie Portland, M.D.   Ct Abdomen Pelvis W Contrast  09/04/2012   CLINICAL DATA:  Weakness. Fatigue. Ulcerated  gastric mass a recent endoscopy.  EXAM: CT ABDOMEN AND PELVIS WITH CONTRAST  TECHNIQUE: Multidetector CT imaging of the abdomen and pelvis was performed using the standard protocol following bolus administration of intravenous contrast.  CONTRAST:  50mL OMNIPAQUE IOHEXOL 300 MG/ML SOLN, OMNIPAQUE IOHEXOL 300 MG/ML SOLN  COMPARISON:  None.  FINDINGS: There is a large, low density, mildly heterogeneous mass anterior and inferior to the stomach and anterior to the pancreas. This is causing significant compression of the body and tail of the pancreas as well as displacement of the stomach with moderate narrowing of the lumen of the body of the stomach. This appears contiguous with a mildly thickened anterior wall of the stomach. This is also causing superior displacement and some compression of the lateral segment of the left lobe of the liver. The mass measures 15.4 x 10.4 cm on image number 35 and 13.8 cm in length on coronal image number 35.  No enlarged lymph nodes. No lung nodules are demonstrated. There is mild dependent atelectasis at both lung bases. Small amount of free peritoneal fluid in the pelvis.  Multiple bilateral renal cysts. Normal appearing adrenal glands, liver, spleen and ovaries. 2.4 cm gallstone in the gallbladder without wall thickening or pericholecystic fluid. No intestinal abnormalities. Mildly heterogeneous uterus. Mild lumbar and lower thoracic spine degenerative changes. No evidence of bony metastatic disease.  IMPRESSION: 1. 15.4 x 13.8 x 10.4 cm upper abdominal mass, as described above. This could represent a carcinoma or sarcoma arising from the stomach or pancreas. 2. No evidence of metastatic disease. 3. Small amount of free peritoneal fluid in the inferior pelvis. 4. Cholelithiasis without evidence of cholecystitis. 5. Probable minimal fibroid changes in the uterus.   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  Submucosal gastric mass -- Gen. surgery has seen the patient and plans for exploratory surgery with likely resection of large gastric tumor. 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 Would continue PPI for now given ulcerated tumor in the stomach.  Monitor hemoglobin. --Remaining plans per surgical team, please call with any questions or concerns  2.  Anemia -- improved after transfusion, monitor for recurrent bleeding Principal Problem:   Syncope Active Problems:   GI bleed   History of multiple sclerosis   HTN (hypertension)   Hyperlipidemia   Acute blood loss anemia   Submucosal lesion of stomach   Gastric ulcer     LOS: 2 days   PYRTLE, JAY M  09/05/2012, 1:09 PM

## 2012-09-05 NOTE — Progress Notes (Signed)
INITIAL NUTRITION ASSESSMENT Patient meets the criteria for severe MALNUTRITION in the context of chronic illness with 9% weight loss in 1 month and PO intake <75% of estimated needs.   DOCUMENTATION CODES Per approved criteria  -Severe malnutrition in the context of chronic illness -Obesity Unspecified   INTERVENTION: 1. Advance diet as tolerated per MD.  2. RD will monitor oral intake after surgery and add supplements as needed.   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   Goal: Patient will meet >/=90% of estimated nutrition needs  Monitor:  PO intake, weights, labs  Reason for Assessment: Malnutrition screening  47 y.o. female  Admitting Dx: Syncope  ASSESSMENT: Patient with history of multiple sclerosis, hypertension, and hyperlipidemia admitted with abdominal pain, nausea, and vomiting, especially with eating. 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 Plan for exploratory surgery with likely resection of tumor. She reports that she is tolerating the clear liquid diet, but is eating small portions. She has lost about 9% of her UBW over the last month with minimal oral intake.   Height: Ht Readings from Last 1 Encounters:  09/04/12 5\' 8"  (1.727 m)    Weight: Wt Readings from Last 1 Encounters:  09/04/12 210 lb 1.6 oz (95.3 kg)    Ideal Body Weight: 140 pounds  % Ideal Body Weight: 150%  Wt Readings from Last 10 Encounters:  09/04/12 210 lb 1.6 oz (95.3 kg)  09/04/12 210 lb 1.6 oz (95.3 kg)  09/04/12 210 lb 1.6 oz (95.3 kg)    Usual Body Weight: 230 pounds  % Usual Body Weight: 91%  BMI:  Body mass index is 31.95 kg/(m^2). Patient is obese.  Estimated Nutritional Needs: Kcal: 2000-2150 kcal Protein: 100-115 g Fluid: >2.8 L/day  Skin: Intact  Diet Order: Clear Liquid  EDUCATION NEEDS: -No education needs identified at this time   Intake/Output Summary (Last 24 hours) at  09/05/12 1444 Last data filed at 09/05/12 1249  Gross per 24 hour  Intake   2006 ml  Output   1801 ml  Net    205 ml    Last BM: 8/24   Labs:   Recent Labs Lab 09/03/12 1918 09/04/12 0659  NA 136 138  K 3.6 3.4*  CL 102 106  CO2 26 25  BUN 8 8  CREATININE 1.07 0.91  CALCIUM 8.5 8.4  GLUCOSE 115* 88    CBG (last 3)   Recent Labs  09/04/12 2345 09/05/12 0611 09/05/12 1144  GLUCAP 115* 90 101*    Scheduled Meds: . glatiramer  20 mg Subcutaneous Daily  . pantoprazole (PROTONIX) IV  40 mg Intravenous Q12H  . simvastatin  10 mg Oral QHS  . sodium chloride  3 mL Intravenous Q12H    Continuous Infusions: . sodium chloride 20 mL/hr at 09/05/12 0944    Past Medical History  Diagnosis Date  . Hypertension   . Hyperlipidemia   . MS (multiple sclerosis)     History reviewed. No pertinent past surgical history.  Linnell Fulling, RD, LDN Pager #: [phone] After-Hours Pager #: [phone]

## 2012-09-05 NOTE — Consult Note (Signed)
Reason for Consult: abdominal mass with GI bleeding Referring Physician: [name] is an 47 y.o. female.  HPI: we were asked to see this pleasant 47 year old female who was admitted following a syncopal episode. She has been found to have a significant GI bleed as a cause for her syncope. The patient states that she has been having some symptoms for about 2 months. She has noted nausea and occasional vomiting after eating. This has gradually worsened. For several weeks she has had pain in her left upper quadrant particularly after eating. She is also noted black tarry stools for about 2 weeks. After a day of shopping 2 days ago she developed lightheadedness and then a syncopal episode and was admitted. She was found to have heme positive stool. Hemoglobin was 8 and felt to 7 with rehydration. She has received 2 units of blood transfusion. She has been hemodynamically stable. Upper endoscopy was performed yesterday which I have reviewed. Showed significant external compression of the stomach with a submucosal or external mass with an overlying erosion and ulceration which appeared to be the source of bleeding without active hemorrhage. CT scan has subsequently been performed which I have personally reviewed. This shows a very large, 15 cm, low-density mass anterior and inferior to the stomach and anterior to the pancreas. It is causing partial obstruction of the stomach. The patient currently is in no distress.  Past Medical History  Diagnosis Date  . Hypertension   . Hyperlipidemia   . MS (multiple sclerosis)     History reviewed. No pertinent past surgical history.  History reviewed. No pertinent family history.  Social History:  reports that she has quit smoking. She has never used smokeless tobacco. She reports that she does not drink alcohol or use illicit drugs.  Allergies: No Known Allergies  Medications:  Prior to Admission:  Prescriptions prior to admission  Medication  Sig Dispense Refill  . glatiramer (COPAXONE) 20 MG/ML injection Inject 1 mL (20 mg total) into the skin daily.  3 kit  1  . lisinopril (PRINIVIL,ZESTRIL) 10 MG tablet Take 10 mg by mouth daily.      . simvastatin (ZOCOR) 10 MG tablet Take 10 mg by mouth at bedtime.        Results for orders placed during the hospital encounter of 09/03/12 (from the past 48 hour(s))  CBC     Status: Abnormal   Collection Time    09/04/12  2:15 AM      Result Value Range   WBC 5.2  4.0 - 10.5 K/uL   RBC 2.61 (*) 3.87 - 5.11 MIL/uL   Hemoglobin 7.6 (*) 12.0 - 15.0 g/dL   HCT 40.9 (*) 81.1 - 91.4 %   MCV 90.4  78.0 - 100.0 fL   MCH 29.1  26.0 - 34.0 pg   MCHC 32.2  30.0 - 36.0 g/dL   RDW 78.2  95.6 - 21.3 %   Platelets 204  150 - 400 K/uL  GLUCOSE, CAPILLARY     Status: None   Collection Time    09/04/12  6:52 AM      Result Value Range   Glucose-Capillary 86  70 - 99 mg/dL   Comment 1 Documented in Chart     Comment 2 Notify RN    CBC     Status: Abnormal   Collection Time    09/04/12  6:59 AM      Result Value Range   WBC 4.0  4.0 - 10.5  K/uL   RBC 2.39 (*) 3.87 - 5.11 MIL/uL   Hemoglobin 7.0 (*) 12.0 - 15.0 g/dL   HCT 16.1 (*) 09.6 - 04.5 %   MCV 90.8  78.0 - 100.0 fL   MCH 29.3  26.0 - 34.0 pg   MCHC 32.3  30.0 - 36.0 g/dL   RDW 40.9  81.1 - 91.4 %   Platelets 183  150 - 400 K/uL  BASIC METABOLIC PANEL     Status: Abnormal   Collection Time    09/04/12  6:59 AM      Result Value Range   Sodium 138  135 - 145 mEq/L   Potassium 3.4 (*) 3.5 - 5.1 mEq/L   Chloride 106  96 - 112 mEq/L   CO2 25  19 - 32 mEq/L   Glucose, Bld 88  70 - 99 mg/dL   BUN 8  6 - 23 mg/dL   Creatinine, Ser 7.82  0.50 - 1.10 mg/dL   Calcium 8.4  8.4 - 95.6 mg/dL   GFR calc non Af Amer 75 (*) >90 mL/min   GFR calc Af Amer 87 (*) >90 mL/min   Comment: (NOTE)     The eGFR has been calculated using the CKD EPI equation.     This calculation has not been validated in all clinical situations.     eGFR's  persistently <90 mL/min signify possible Chronic Kidney     Disease.  HEPATIC FUNCTION PANEL     Status: Abnormal   Collection Time    09/04/12  6:59 AM      Result Value Range   Total Protein 5.6 (*) 6.0 - 8.3 g/dL   Albumin 3.0 (*) 3.5 - 5.2 g/dL   AST 9  0 - 37 U/L   ALT 6  0 - 35 U/L   Alkaline Phosphatase 35 (*) 39 - 117 U/L   Total Bilirubin 0.3  0.3 - 1.2 mg/dL   Bilirubin, Direct <2.1  0.0 - 0.3 mg/dL   Indirect Bilirubin NOT CALCULATED  0.3 - 0.9 mg/dL  TYPE AND SCREEN     Status: None   Collection Time    09/04/12  6:59 AM      Result Value Range   ABO/RH(D) O POS     Antibody Screen NEG     Sample Expiration 09/07/2012     Unit Number [id number]     Blood Component Type RBC LR PHER2     Unit division 00     Status of Unit ISSUED,FINAL     Transfusion Status OK TO TRANSFUSE     Crossmatch Result Compatible     Unit Number [id number]     Blood Component Type RBC LR PHER2     Unit division 00     Status of Unit ISSUED,FINAL     Transfusion Status OK TO TRANSFUSE     Crossmatch Result Compatible    ABO/RH     Status: None   Collection Time    09/04/12  6:59 AM      Result Value Range   ABO/RH(D) O POS    PREPARE RBC (CROSSMATCH)     Status: None   Collection Time    09/04/12  8:56 AM      Result Value Range   Order Confirmation ORDER PROCESSED BY BLOOD BANK    CBC     Status: Abnormal   Collection Time    09/04/12 10:59 AM      Result Value Range  WBC 3.8 (*) 4.0 - 10.5 K/uL   RBC 2.44 (*) 3.87 - 5.11 MIL/uL   Hemoglobin 7.2 (*) 12.0 - 15.0 g/dL   HCT 16.1 (*) 09.6 - 04.5 %   MCV 91.0  78.0 - 100.0 fL   MCH 29.5  26.0 - 34.0 pg   MCHC 32.4  30.0 - 36.0 g/dL   RDW 40.9  81.1 - 91.4 %   Platelets 198  150 - 400 K/uL  GLUCOSE, CAPILLARY     Status: None   Collection Time    09/04/12 12:24 PM      Result Value Range   Glucose-Capillary 83  70 - 99 mg/dL  GLUCOSE, CAPILLARY     Status: None   Collection Time    09/04/12  5:11 PM      Result  Value Range   Glucose-Capillary 79  70 - 99 mg/dL  GLUCOSE, CAPILLARY     Status: Abnormal   Collection Time    09/04/12 11:45 PM      Result Value Range   Glucose-Capillary 115 (*) 70 - 99 mg/dL  GLUCOSE, CAPILLARY     Status: None   Collection Time    09/05/12  6:11 AM      Result Value Range   Glucose-Capillary 90  70 - 99 mg/dL  CBC     Status: Abnormal   Collection Time    09/05/12  8:15 AM      Result Value Range   WBC 6.1  4.0 - 10.5 K/uL   RBC 3.08 (*) 3.87 - 5.11 MIL/uL   Hemoglobin 9.2 (*) 12.0 - 15.0 g/dL   Comment: DELTA CHECK NOTED     POST TRANSFUSION SPECIMEN   HCT 27.5 (*) 36.0 - 46.0 %   MCV 89.3  78.0 - 100.0 fL   MCH 29.9  26.0 - 34.0 pg   MCHC 33.5  30.0 - 36.0 g/dL   RDW 78.2  95.6 - 21.3 %   Platelets 221  150 - 400 K/uL    Ct Head Wo Contrast  09/03/2012   *RADIOLOGY REPORT*  Clinical Data: Syncope.  Nausea and vomiting.  Current history of multiple sclerosis.  CT HEAD WITHOUT CONTRAST  Technique:  Contiguous axial images were obtained from the base of the skull through the vertex without contrast.  Comparison: MRI brain 07/22/2011.  Findings: White matter changes in the cerebral hemispheres as noted on the prior MR, consistent with the given history of multiple sclerosis.  Ventricular system normal in size appearance for age, unchanged.  No mass lesion.  No midline shift.  No acute hemorrhage or hematoma.  No extra-axial fluid collections.  No evidence of acute infarction.  No skull fracture or other focal osseous abnormality involving the skull.  Visualized paranasal sinuses, bilateral mastoid air cells, and bilateral middle ear cavities well-aerated.  IMPRESSION:  1.  No acute intracranial abnormality. 2.  White matter changes in the cerebral hemispheres as noted on the prior MR examination, consistent with the given history of multiple sclerosis.   Original Report Authenticated By: Hulan Saas, M.D.   US Abdomen Complete  09/04/2012   The *RADIOLOGY  REPORT*  Clinical Data:  Abdominal pain with nausea vomiting  COMPLETE ABDOMINAL ULTRASOUND  Comparison:  None.  Findings: There is a large midline mass that is hypoechoic but solid measuring 16.7 cm x 9.5 cm x 14.5 cm.  The origin of this mass is unclear on ultrasound.  Gallbladder:  Multiple shadowing stones, largest measuring 2.1  cm. No wall thickening or pericholecystic fluid.  No evidence of acute cholecystitis.  Common bile duct:  Normal in caliber.  Duct measures 4.7 mm.  No duct stones.  Liver:  Mildly heterogeneous in echotexture.  The liver is normal in size.  No mass or focal lesion.  IVC:  Appears normal.  Pancreas:  Non visualized due to overlying bowel gas.  Spleen:  Normal in size echogenicity measuring 6.4 cm  Right Kidney:  Multiple right renal cysts.  The largest lies at the mid pole measuring 3.3 cm.  No solid renal masses, no stones and no hydronephrosis.  The right kidney measures 11.4 cm in length.  Left Kidney:  Small mid pole cyst measuring 7 mm.  No other renal masses, no stones and no hydronephrosis.  Left kidney measures 11.6 cm  Abdominal aorta:  No aneurysm identified.  IMPRESSION: There is a large midline solid abdominal mass.  Recommend follow-up contrast enhanced CT scan of the abdomen pelvis for further evaluation.  No acute findings.  Cholelithiasis without evidence of acute cholecystitis.   Original Report Authenticated By: Amie Portland, M.D.   Ct Abdomen Pelvis W Contrast  09/04/2012   CLINICAL DATA:  Weakness. Fatigue. Ulcerated gastric mass a recent endoscopy.  EXAM: CT ABDOMEN AND PELVIS WITH CONTRAST  TECHNIQUE: Multidetector CT imaging of the abdomen and pelvis was performed using the standard protocol following bolus administration of intravenous contrast.  CONTRAST:  50mL OMNIPAQUE IOHEXOL 300 MG/ML SOLN, OMNIPAQUE IOHEXOL 300 MG/ML SOLN  COMPARISON:  None.  FINDINGS: There is a large, low density, mildly heterogeneous mass anterior and inferior to the stomach and  anterior to the pancreas. This is causing significant compression of the body and tail of the pancreas as well as displacement of the stomach with moderate narrowing of the lumen of the body of the stomach. This appears contiguous with a mildly thickened anterior wall of the stomach. This is also causing superior displacement and some compression of the lateral segment of the left lobe of the liver. The mass measures 15.4 x 10.4 cm on image number 35 and 13.8 cm in length on coronal image number 35.  No enlarged lymph nodes. No lung nodules are demonstrated. There is mild dependent atelectasis at both lung bases. Small amount of free peritoneal fluid in the pelvis.  Multiple bilateral renal cysts. Normal appearing adrenal glands, liver, spleen and ovaries. 2.4 cm gallstone in the gallbladder without wall thickening or pericholecystic fluid. No intestinal abnormalities. Mildly heterogeneous uterus. Mild lumbar and lower thoracic spine degenerative changes. No evidence of bony metastatic disease.  IMPRESSION: 1. 15.4 x 13.8 x 10.4 cm upper abdominal mass, as described above. This could represent a carcinoma or sarcoma arising from the stomach or pancreas. 2. No evidence of metastatic disease. 3. Small amount of free peritoneal fluid in the inferior pelvis. 4. Cholelithiasis without evidence of cholecystitis. 5. Probable minimal fibroid changes in the uterus.   Electronically Signed   By: Gordan Payment   On: 09/04/2012 22:11    Review of Systems  Constitutional: Positive for malaise/fatigue. Negative for fever and chills.  HENT: Negative.   Respiratory: Negative.   Cardiovascular: Negative.   Gastrointestinal: Positive for nausea, vomiting, abdominal pain, constipation and melena. Negative for diarrhea and blood in stool.  Genitourinary: Negative.   Musculoskeletal: Negative.   Neurological: Negative.        Diagnosis of multiple sclerosis but currently asymptomatic   Blood pressure 134/76, pulse 77,  temperature 99.2 F (37.3  C), temperature source Oral, resp. rate 16, height 5\' 8"  (1.727 m), weight 210 lb 1.6 oz (95.3 kg), last menstrual period 08/20/2012, SpO2 97.00%. Physical Exam General: Alert, mildly obese African American female, in no distress Skin: Warm and dry without rash or infection. HEENT: No palpable masses or thyromegaly. Sclera nonicteric. Pupils equal round and reactive. Oropharynx clear. Lymph nodes: No cervical, supraclavicular, or inguinal nodes palpable. Lungs: Breath sounds clear and equal without increased work of breathing Cardiovascular: Regular rate and rhythm without murmur. No JVD or edema. Peripheral pulses intact. Abdomen: Nondistended.  There is a large firm palpable mass occupying the entire left upper quadrant of the abdomen which is mildly tender. No palpable hernias. Extremities: No edema or joint swelling or deformity. No chronic venous stasis changes. Neurologic: Alert and fully oriented. No gross motor deficits.  Assessment/Plan: Large mass in the left upper quadrant of the abdomen with compression of the stomach and GI bleeding from mucosal erosion. 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  Other possibilities would include carcinoma of the stomach or pancreas or sarcoma. GIST would seem most likely due to the size of the mass without any apparent spread or invasion of surrounding organs. I don'[name] feel that any further workup would be indicated. I believe the next step would be exploration and hopefully resection. I discussed this with the patient. Dr. Maisie Fus will be taking over tomorrow morning and will discuss possible surgical plans with the patient.  Monica [name] 09/05/2012, 10:34 AM

## 2012-09-05 NOTE — Progress Notes (Signed)
[facility] PROGRESS NOTE  Monica [name] VHQ:[id number] DOB: 1965/09/20 DOA: 09/03/2012 PCP: No primary provider on file.  Brief narrative: 47 y.o. female with history of multiple sclerosis, hypertension and hyperlipidemia who presented to [facility] ED status post syncopal event on the day of the admission. Patient reported not having similar events in past. She reported having nausea and non bloody vomiting associated with dark stool.for past 1-2 days prior to this admission Pt reported she does not take aspirin but occasionally takes ibuprofen.  In ED, pt remained hemodynamically stable. CBC revealed hemoglobin of 8 which trended down to 7. GI consulted and pt underwent subsequent EGD with findings of gastric submucosal mass (favored over extrinsic compression)  in the gastric body with ulceration at the center (most likely source of bleeding/anemia). CT abdomen further revealed 15.4 x 13.8 x 10.4 cm upper abdominal mass which could represent a carcinoma or sarcoma arising from the stomach or pancreas with no evidence of metastatic disease. Surgery was consulted for furhter input on management.  Assessment/Plan:   Principal Problem:  Syncope  - likely secondary to upper GI bleed  - no acute intracranial findings on CT head  - continue supportive care with IV fluids, NS @ 75 cc/hr   Active Problems:  Acute blood loss anemia  - likely due to upper GI bleed secondary to upper abdominal mass seen on EGD and CT abdomen - pt has received 2 units PRBC's 09/04/2012 with appropriate rise in hemoglobin 7 --> 9.2 - status post EGD 09/04/2012 with findings of gastric submucosal mass as likely a source of bleeding - per GI continue protonix twice a day and clear liquid diet; surgery consultation recomended - aoreciate surgery consult History of multiple sclerosis  - on glatiramer  HTN (hypertension)  - hydralazine PRN  Hyperlipidemia  - continue simvastatin   Code Status: full code  Family  Communication: no family at the bedside  Disposition Plan: home when stable   Manson Passey, MD  [facility]  Pager [phone]   If 7PM-7AM, please contact night-coverage [email or web address] Password [facility] 09/05/2012, 8:22 AM   LOS: 2 days   Consultants:  GI (Dr. Rhea Belton)  Surgery (Dr. Johna Sheriff)  Procedures:  EGD 09/04/2012 with findings of gastric submucosal mass  Antibiotics:  None   HPI/Subjective: Feels little better this am.   Objective: Filed Vitals:   09/04/12 1720 09/04/12 1740 09/04/12 2057 09/05/12 0625  BP: 136/79 135/62 145/91 134/76  Pulse: 63 68 64 77  Temp: 98.9 F (37.2 C) 98.9 F (37.2 C) 98.6 F (37 C) 99.2 F (37.3 C)  TempSrc:   Oral Oral  Resp: 14 14 18 16   Height:      Weight:      SpO2:   100% 97%    Intake/Output Summary (Last 24 hours) at 09/05/12 1027 Last data filed at 09/05/12 0700  Gross per 24 hour  Intake 2511.83 ml  Output   1300 ml  Net 1211.83 ml    Exam:   General:  Pt is alert, follows commands appropriately, not in acute distress  Cardiovascular: Regular rate and rhythm, S1/S2, no murmurs, no rubs, no gallops  Respiratory: Clear to auscultation bilaterally, no wheezing, no crackles, no rhonchi  Abdomen: Soft, non tender, non distended, bowel sounds present, no guarding  Extremities: No edema, pulses DP and PT palpable bilaterally  Neuro: Grossly nonfocal  Data Reviewed: Basic Metabolic Panel:  Recent Labs Lab 09/03/12 1918 09/04/12 0659  NA 136 138  K 3.6 3.4*  CL 102  106  CO2 26 25  GLUCOSE 115* 88  BUN 8 8  CREATININE 1.07 0.91  CALCIUM 8.5 8.4   Liver Function Tests:  Recent Labs Lab 09/03/12 1918 09/04/12 0659  AST 11 9  ALT 6 6  ALKPHOS 41 35*  BILITOT 0.3 0.3  PROT 6.5 5.6*  ALBUMIN 3.4* 3.0*    Recent Labs Lab 09/03/12 1918  LIPASE 98*   No results found for this basename: AMMONIA,  in the last 168 hours CBC:  Recent Labs Lab 09/03/12 1918 09/04/12 0215 09/04/12 0659 09/04/12 1059   WBC 6.1 5.2 4.0 3.8*  NEUTROABS 5.0  --   --   --   HGB 8.0* 7.6* 7.0* 7.2*  HCT 24.3* 23.6* 21.7* 22.2*  MCV 91.4 90.4 90.8 91.0  PLT 205 204 183 198   Cardiac Enzymes: No results found for this basename: CKTOTAL, CKMB, CKMBINDEX, TROPONINI,  in the last 168 hours BNP: No components found with this basename: POCBNP,  CBG:  Recent Labs Lab 09/04/12 0652 09/04/12 1224 09/04/12 1711 09/04/12 2345 09/05/12 0611  GLUCAP 86 83 79 115* 90    No results found for this or any previous visit (from the past 240 hour(s)).   Studies: Ct Head Wo Contrast  09/03/2012   *RADIOLOGY REPORT*  Clinical Data: Syncope.  Nausea and vomiting.  Current history of multiple sclerosis.  CT HEAD WITHOUT CONTRAST  Technique:  Contiguous axial images were obtained from the base of the skull through the vertex without contrast.  Comparison: MRI brain 07/22/2011.  Findings: White matter changes in the cerebral hemispheres as noted on the prior MR, consistent with the given history of multiple sclerosis.  Ventricular system normal in size appearance for age, unchanged.  No mass lesion.  No midline shift.  No acute hemorrhage or hematoma.  No extra-axial fluid collections.  No evidence of acute infarction.  No skull fracture or other focal osseous abnormality involving the skull.  Visualized paranasal sinuses, bilateral mastoid air cells, and bilateral middle ear cavities well-aerated.  IMPRESSION:  1.  No acute intracranial abnormality. 2.  White matter changes in the cerebral hemispheres as noted on the prior MR examination, consistent with the given history of multiple sclerosis.   Original Report Authenticated By: Hulan Saas, M.D.   US Abdomen Complete  09/04/2012   The *RADIOLOGY REPORT*  Clinical Data:  Abdominal pain with nausea vomiting  COMPLETE ABDOMINAL ULTRASOUND  Comparison:  None.  Findings: There is a large midline mass that is hypoechoic but solid measuring 16.7 cm x 9.5 cm x 14.5 cm.  The origin  of this mass is unclear on ultrasound.  Gallbladder:  Multiple shadowing stones, largest measuring 2.1 cm. No wall thickening or pericholecystic fluid.  No evidence of acute cholecystitis.  Common bile duct:  Normal in caliber.  Duct measures 4.7 mm.  No duct stones.  Liver:  Mildly heterogeneous in echotexture.  The liver is normal in size.  No mass or focal lesion.  IVC:  Appears normal.  Pancreas:  Non visualized due to overlying bowel gas.  Spleen:  Normal in size echogenicity measuring 6.4 cm  Right Kidney:  Multiple right renal cysts.  The largest lies at the mid pole measuring 3.3 cm.  No solid renal masses, no stones and no hydronephrosis.  The right kidney measures 11.4 cm in length.  Left Kidney:  Small mid pole cyst measuring 7 mm.  No other renal masses, no stones and no hydronephrosis.  Left kidney measures 11.6  cm  Abdominal aorta:  No aneurysm identified.  IMPRESSION: There is a large midline solid abdominal mass.  Recommend follow-up contrast enhanced CT scan of the abdomen pelvis for further evaluation.  No acute findings.  Cholelithiasis without evidence of acute cholecystitis.   Original Report Authenticated By: Amie Portland, M.D.   Ct Abdomen Pelvis W Contrast  09/04/2012   CLINICAL DATA:  Weakness. Fatigue. Ulcerated gastric mass a recent endoscopy.  EXAM: CT ABDOMEN AND PELVIS WITH CONTRAST  TECHNIQUE: Multidetector CT imaging of the abdomen and pelvis was performed using the standard protocol following bolus administration of intravenous contrast.  CONTRAST:  50mL OMNIPAQUE IOHEXOL 300 MG/ML SOLN, OMNIPAQUE IOHEXOL 300 MG/ML SOLN  COMPARISON:  None.  FINDINGS: There is a large, low density, mildly heterogeneous mass anterior and inferior to the stomach and anterior to the pancreas. This is causing significant compression of the body and tail of the pancreas as well as displacement of the stomach with moderate narrowing of the lumen of the body of the stomach. This appears contiguous  with a mildly thickened anterior wall of the stomach. This is also causing superior displacement and some compression of the lateral segment of the left lobe of the liver. The mass measures 15.4 x 10.4 cm on image number 35 and 13.8 cm in length on coronal image number 35.  No enlarged lymph nodes. No lung nodules are demonstrated. There is mild dependent atelectasis at both lung bases. Small amount of free peritoneal fluid in the pelvis.  Multiple bilateral renal cysts. Normal appearing adrenal glands, liver, spleen and ovaries. 2.4 cm gallstone in the gallbladder without wall thickening or pericholecystic fluid. No intestinal abnormalities. Mildly heterogeneous uterus. Mild lumbar and lower thoracic spine degenerative changes. No evidence of bony metastatic disease.  IMPRESSION: 1. 15.4 x 13.8 x 10.4 cm upper abdominal mass, as described above. This could represent a carcinoma or sarcoma arising from the stomach or pancreas. 2. No evidence of metastatic disease. 3. Small amount of free peritoneal fluid in the inferior pelvis. 4. Cholelithiasis without evidence of cholecystitis. 5. Probable minimal fibroid changes in the uterus.   Electronically Signed   By: Gordan Payment   On: 09/04/2012 22:11    Scheduled Meds: . glatiramer  20 mg Subcutaneous Daily  . pantoprazole (PROTONIX) IV  40 mg Intravenous Q12H  . simvastatin  10 mg Oral QHS  . sodium chloride  3 mL Intravenous Q12H   Continuous Infusions: . sodium chloride 20 mL/hr at 09/04/12 1415

## 2012-09-06 ENCOUNTER — Encounter (HOSPITAL_COMMUNITY): Payer: Self-pay | Admitting: Internal Medicine

## 2012-09-06 DIAGNOSIS — E876 Hypokalemia: Secondary | ICD-10-CM | POA: Diagnosis present

## 2012-09-06 LAB — BASIC METABOLIC PANEL
BUN: 3 mg/dL — ABNORMAL LOW (ref 6–23)
Calcium: 8.3 mg/dL — ABNORMAL LOW (ref 8.4–10.5)
Chloride: 105 mEq/L (ref 96–112)
Creatinine, Ser: 0.89 mg/dL (ref 0.50–1.10)
GFR calc Af Amer: 89 mL/min — ABNORMAL LOW (ref >90)
GFR calc non Af Amer: 77 mL/min — ABNORMAL LOW (ref >90)

## 2012-09-06 LAB — CBC
MCH: 29.6 pg (ref 26.0–34.0)
MCHC: 32.5 g/dL (ref 30.0–36.0)
MCV: 91 fL (ref 78.0–100.0)
Platelets: 224 10*3/uL (ref 150–400)
RDW: 13.5 % (ref 11.5–15.5)
WBC: 5.3 10*3/uL (ref 4.0–10.5)

## 2012-09-06 LAB — GLUCOSE, CAPILLARY
Glucose-Capillary: 93 mg/dL (ref 70–99)
Glucose-Capillary: 86 mg/dL (ref 70–99)

## 2012-09-06 MED ORDER — CHLORHEXIDINE GLUCONATE 4 % EX LIQD
1.0000 "application " | Freq: Once | CUTANEOUS | Status: AC
  Administered 2012-09-06: 1 via TOPICAL
  Filled 2012-09-06: qty 15

## 2012-09-06 MED ORDER — POTASSIUM CHLORIDE 10 MEQ/100ML IV SOLN
10.0000 meq | INTRAVENOUS | Status: AC
  Administered 2012-09-06 (×4): 10 meq via INTRAVENOUS
  Filled 2012-09-06 (×3): qty 100

## 2012-09-06 MED ORDER — CHLORHEXIDINE GLUCONATE 4 % EX LIQD
1.0000 "application " | Freq: Once | CUTANEOUS | Status: AC
  Administered 2012-09-07: 1 via TOPICAL
  Filled 2012-09-06: qty 15

## 2012-09-06 MED ORDER — MORPHINE SULFATE 2 MG/ML IJ SOLN: 1.0000 mg | INTRAMUSCULAR | Status: DC | PRN

## 2012-09-06 NOTE — Progress Notes (Signed)
Spoke with [facility] @ Dr Maisie Fus' office, left message that patient definitely wants to procede with surgery tomorrow, child care issues resolved.

## 2012-09-06 NOTE — Care Management Note (Addendum)
    Page 1 of 1   09/07/2012     12:06:06 PM   CARE MANAGEMENT NOTE 09/07/2012  Patient:  [facility]   Account Number:  [id number]  Date Initiated:  09/06/2012  Documentation initiated by:  Lanier Clam  Subjective/Objective Assessment:   46 Y/O F ADMITTED W/SYNCOPE.GASTRIC MASS.     Action/Plan:   FROM HOME,HAS PCP,PHARMACY.HAS MENTALLY CHALLENGED SON.   Anticipated DC Date:  09/10/2012   Anticipated DC Plan:  HOME/SELF CARE      DC Planning Services  CM consult      Choice offered to / List presented to:             Status of service:  In process, will continue to follow Medicare Important Message given?   (If response is "NO", the following Medicare IM given date fields will be blank) Date Medicare IM given:   Date Additional Medicare IM given:    Discharge Disposition:    Per UR Regulation:  Reviewed for med. necessity/level of care/duration of stay  If discussed at Long Length of Stay Meetings, dates discussed:    Comments:  09/07/12 Viraaj Vorndran RN,BSN NCM [phone] SX TODAY.  09/06/12 Makeshia Seat RN,BSN NCM [phone] SX FOLLOWING-SX IN AM-GASTRIC MASS,EXPLORATORY,?RESECTION TUMOR.SEE SW NOTES-RESOURCES PROVIDED FOR PATIENT.

## 2012-09-06 NOTE — Progress Notes (Addendum)
[facility] PROGRESS NOTE  Monica [name] HYQ:[id number] DOB: 1965/06/26 DOA: 09/03/2012 PCP: No primary provider on file.  Brief narrative: 47 y.o. female with history of multiple sclerosis, hypertension and hyperlipidemia who presented to [facility] ED status post syncopal event on the day of the admission. Patient reported not having similar events in past. She reported having nausea and non bloody vomiting associated with dark stool.for past 1-2 days prior to this admission Pt reported she does not take aspirin but occasionally takes ibuprofen.  In ED, pt remained hemodynamically stable. CBC revealed hemoglobin of 8 which trended down to 7. GI consulted and pt underwent subsequent EGD with findings of gastric submucosal mass (favored over extrinsic compression) in the gastric body with ulceration at the center (most likely source of bleeding/anemia). CT abdomen further revealed 15.4 x 13.8 x 10.4 cm upper abdominal mass which could represent a carcinoma or sarcoma arising from the stomach or pancreas with no evidence of metastatic disease. Surgery was consulted for furhter input on management and plan is for surgical exploration with possible resection of the gastric tumor.  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) - pt has received 2 units PRBC's 09/04/2012 with appropriate rise in hemoglobin 7 --> 9.2  - status post EGD 09/04/2012 with findings of gastric submucosal mass as likely a source of bleeding  - per GI recommendations continue protonix twice a day  - appreciate surgery recommendations, plan for exploration and possible resection of the gastric tumor Submucosal lesion of stomach - Management as above per surgery, resection of  gastric tumor Protein-calorie malnutrition, severe - Nutrition consulted Hypokalemia - Secondary to GI losses - Repleted today History of multiple sclerosis  - on glatiramer  HTN (hypertension)  - hydralazine PRN  Hyperlipidemia  - continue simvastatin   Code Status: full code  Family Communication: no family at the bedside  Disposition Plan: home when stable   Manson Passey, MD  [facility]  Pager [phone]   Consultants:  GI (Dr. Rhea Belton)  Surgery (Dr. Johna Sheriff) Procedures:  EGD 09/04/2012 with findings of gastric submucosal mass Exploratory laparotomy 09/06/2012 Antibiotics:  None    If 7PM-7AM, please contact night-coverage [email or web address] Password [email or web address] 09/06/2012, 7:08 AM   LOS: 3 days    HPI/Subjective: No acute overnight events.  Objective: Filed Vitals:   09/04/12 2057 09/05/12 0625 09/05/12 2109 09/06/12 0528  BP: 145/91 134/76 141/90 139/75  Pulse: 64 77 66 66  Temp: 98.6 F (37 C) 99.2 F (37.3 C) 99.6 F (37.6 C) 98.9 F (37.2 C)  TempSrc: Oral Oral Oral Oral  Resp: 18 16 18 18   Height:      Weight:      SpO2: 100% 97% 98% 97%    Intake/Output Summary (Last 24 hours) at 09/06/12 0708 Last data filed at 09/06/12 0644  Gross per 24 hour  Intake 1489.33 ml  Output   2501 ml  Net -1011.67 ml    Exam:   General:  Pt is alert, follows commands appropriately, not in acute distress  Cardiovascular: Regular rate and rhythm, S1/S2, no murmurs, no rubs, no gallops  Respiratory: Clear to auscultation bilaterally, no wheezing, no crackles, no rhonchi  Abdomen: Soft, non tender, non distended, bowel sounds present,  no guarding  Extremities: No edema, pulses DP and PT palpable bilaterally  Neuro: Grossly nonfocal  Data Reviewed: Basic Metabolic Panel:  Recent Labs Lab 09/03/12 1918 09/04/12 0659 09/06/12 0435  NA 136 138 138  K 3.6 3.4* 3.1*  CL 102 106 105  CO2 26 25 24   GLUCOSE 115* 88 92  BUN 8 8 3*  CREATININE 1.07 0.91 0.89  CALCIUM  8.5 8.4 8.3*   Liver Function Tests:  Recent Labs Lab 09/03/12 1918 09/04/12 0659  AST 11 9  ALT 6 6  ALKPHOS 41 35*  BILITOT 0.3 0.3  PROT 6.5 5.6*  ALBUMIN 3.4* 3.0*    Recent Labs Lab 09/03/12 1918  LIPASE 98*   No results found for this basename: AMMONIA,  in the last 168 hours CBC:  Recent Labs Lab 09/03/12 1918 09/04/12 0215 09/04/12 0659 09/04/12 1059 09/05/12 0815 09/06/12 0435  WBC 6.1 5.2 4.0 3.8* 6.1 5.3  NEUTROABS 5.0  --   --   --   --   --   HGB 8.0* 7.6* 7.0* 7.2* 9.2* 9.5*  HCT 24.3* 23.6* 21.7* 22.2* 27.5* 29.2*  MCV 91.4 90.4 90.8 91.0 89.3 91.0  PLT 205 204 183 198 221 224   Cardiac Enzymes: No results found for this basename: CKTOTAL, CKMB, CKMBINDEX, TROPONINI,  in the last 168 hours BNP: No components found with this basename: POCBNP,  CBG:  Recent Labs Lab 09/05/12 0611 09/05/12 1144 09/05/12 1645 09/05/12 2343 09/06/12 0627  GLUCAP 90 101* 104* 90 82    No results found for this or any previous visit (from the past 240 hour(s)).   Studies: US Abdomen Complete 09/04/2012     IMPRESSION: There is a large midline solid abdominal mass.  Recommend follow-up contrast enhanced CT scan of the abdomen pelvis for further evaluation.  No acute findings.  Cholelithiasis without evidence of acute cholecystitis.   Original Report Authenticated By: Amie Portland, M.D.   Ct Abdomen Pelvis W Contrast 09/04/2012     IMPRESSION: 1. 15.4 x 13.8 x 10.4 cm upper abdominal mass, as described above. This could represent a carcinoma or sarcoma arising from the stomach or pancreas. 2. No evidence of metastatic disease. 3. Small amount of free peritoneal fluid in the inferior pelvis. 4. Cholelithiasis without evidence of cholecystitis. 5. Probable minimal fibroid changes in the uterus.   Electronically Signed   By: Gordan Payment   On: 09/04/2012 22:11    Scheduled Meds: . glatiramer  20 mg Subcutaneous Daily  . pantoprazole (PROTONIX) IV  40 mg Intravenous  Q12H  . potassium chloride  10 mEq Intravenous Q1 Hr x 3  . simvastatin  10 mg Oral QHS  . sodium chloride  3 mL Intravenous Q12H   Continuous Infusions: . sodium chloride Stopped (09/05/12 1249)

## 2012-09-06 NOTE — Progress Notes (Signed)
Syncope, UGI bleed  Subjective: HPI: we were asked to see this pleasant 47 year old female who was admitted following a syncopal episode. She has been found to have a significant GI bleed as a cause for her syncope. The patient states that she has been having some symptoms for about 2 months. She has noted nausea and occasional vomiting after eating. This has gradually worsened. For several weeks she has had pain in her left upper quadrant particularly after eating. She is also noted black tarry stools for about 2 weeks. After a day of shopping 2 days ago she developed lightheadedness and then a syncopal episode and was admitted. She was found to have heme positive stool. Hemoglobin was 8 and felt to 7 with rehydration. She has received 2 units of blood transfusion. She has been hemodynamically stable.  Upper endoscopy was performed Sat, which I have reviewed. Showed significant external compression of the stomach with a submucosal or external mass with an overlying erosion and ulceration which appeared to be the source of bleeding without active hemorrhage.  CT scan has subsequently been performed which I have personally reviewed. This shows a very large, 15 cm, low-density mass anterior and inferior to the stomach and anterior to the pancreas. It is causing partial obstruction of the stomach.  The patient currently is in no distress.  Objective: Vital signs in last 24 hours: Temp:  [98.9 F (37.2 C)-99.6 F (37.6 C)] 98.9 F (37.2 C) (08/25 0528) Pulse Rate:  [66] 66 (08/25 0528) Resp:  [18] 18 (08/25 0528) BP: (139-141)/(75-90) 139/75 mmHg (08/25 0528) SpO2:  [97 %-98 %] 97 % (08/25 0528) Last BM Date: 09/05/12  Intake/Output from previous day: 08/24 0701 - 08/25 0700 In: 1489.3 [P.O.:1320; I.V.:169.3] Out: 2501 [Urine:2500; Stool:1] Intake/Output this shift:    General appearance: alert and cooperative GI: normal findings: soft, non-tender Large mass palpated within upper abd Lab  Results:  Results for orders placed during the hospital encounter of 09/03/12 (from the past 24 hour(s))  GLUCOSE, CAPILLARY     Status: Abnormal   Collection Time    09/05/12 11:44 AM      Result Value Range   Glucose-Capillary 101 (*) 70 - 99 mg/dL  GLUCOSE, CAPILLARY     Status: Abnormal   Collection Time    09/05/12  4:45 PM      Result Value Range   Glucose-Capillary 104 (*) 70 - 99 mg/dL  GLUCOSE, CAPILLARY     Status: None   Collection Time    09/05/12 11:43 PM      Result Value Range   Glucose-Capillary 90  70 - 99 mg/dL  CBC     Status: Abnormal   Collection Time    09/06/12  4:35 AM      Result Value Range   WBC 5.3  4.0 - 10.5 K/uL   RBC 3.21 (*) 3.87 - 5.11 MIL/uL   Hemoglobin 9.5 (*) 12.0 - 15.0 g/dL   HCT 16.1 (*) 09.6 - 04.5 %   MCV 91.0  78.0 - 100.0 fL   MCH 29.6  26.0 - 34.0 pg   MCHC 32.5  30.0 - 36.0 g/dL   RDW 40.9  81.1 - 91.4 %   Platelets 224  150 - 400 K/uL  BASIC METABOLIC PANEL     Status: Abnormal   Collection Time    09/06/12  4:35 AM      Result Value Range   Sodium 138  135 - 145 mEq/L   Potassium  3.1 (*) 3.5 - 5.1 mEq/L   Chloride 105  96 - 112 mEq/L   CO2 24  19 - 32 mEq/L   Glucose, Bld 92  70 - 99 mg/dL   BUN 3 (*) 6 - 23 mg/dL   Creatinine, Ser 6.96  0.50 - 1.10 mg/dL   Calcium 8.3 (*) 8.4 - 10.5 mg/dL   GFR calc non Af Amer 77 (*) >90 mL/min   GFR calc Af Amer 89 (*) >90 mL/min  GLUCOSE, CAPILLARY     Status: None   Collection Time    09/06/12  6:27 AM      Result Value Range   Glucose-Capillary 82  70 - 99 mg/dL  SURGICAL PCR SCREEN     Status: None   Collection Time    09/06/12  6:31 AM      Result Value Range   MRSA, PCR NEGATIVE  NEGATIVE   Staphylococcus aureus NEGATIVE  NEGATIVE     Studies/Results Radiology     MEDS, Scheduled . glatiramer  20 mg Subcutaneous Daily  . pantoprazole (PROTONIX) IV  40 mg Intravenous Q12H  . potassium chloride  10 mEq Intravenous Q1 Hr x 3  . simvastatin  10 mg Oral QHS  .  sodium chloride  3 mL Intravenous Q12H     Assessment: Syncope UGI bleed Upper abd mass  Plan: We discussed possible surgical intervention, extent and possibilities of surgery, length of stay and expectations after surgery.  She is concerned about care of her disabled child at home, and I do not feel that she is ready for surgery today.  She will try to arrange care for her son today, and I will tentatively plan for surgery tomorrow.  I have ordered her some clear liquids today and to be NPO after Midnight.    LOS: 3 days    Vanita Panda, MD [facility], [place] [phone]   09/06/2012 9:53 AM

## 2012-09-07 ENCOUNTER — Encounter (HOSPITAL_COMMUNITY): Payer: Self-pay | Admitting: Anesthesiology

## 2012-09-07 ENCOUNTER — Encounter (HOSPITAL_COMMUNITY): Payer: Self-pay | Admitting: Certified Registered Nurse Anesthetist

## 2012-09-07 ENCOUNTER — Inpatient Hospital Stay (HOSPITAL_COMMUNITY): Payer: Medicaid Other | Admitting: Certified Registered Nurse Anesthetist

## 2012-09-07 ENCOUNTER — Encounter (HOSPITAL_COMMUNITY): Admission: EM | Disposition: A | Discharge status: Home / Self Care | Payer: Self-pay | Source: Home / Self Care

## 2012-09-07 DIAGNOSIS — C49A2 Gastrointestinal stromal tumor of stomach: Secondary | ICD-10-CM

## 2012-09-07 DIAGNOSIS — C169 Malignant neoplasm of stomach, unspecified: Secondary | ICD-10-CM

## 2012-09-07 HISTORY — PX: LAPAROTOMY: SHX154

## 2012-09-07 HISTORY — DX: Gastrointestinal stromal tumor of stomach: C49.A2

## 2012-09-07 HISTORY — PX: PARTIAL GASTRECTOMY: SHX6003

## 2012-09-07 LAB — GLUCOSE, CAPILLARY
Glucose-Capillary: 136 mg/dL — ABNORMAL HIGH (ref 70–99)
Glucose-Capillary: 94 mg/dL (ref 70–99)

## 2012-09-07 LAB — CBC
HCT: 28.7 % — ABNORMAL LOW (ref 36.0–46.0)
MCH: 29.5 pg (ref 26.0–34.0)
MCV: 87.2 fL (ref 78.0–100.0)
Platelets: 232 10*3/uL (ref 150–400)
RDW: 16.1 % — ABNORMAL HIGH (ref 11.5–15.5)

## 2012-09-07 SURGERY — LAPAROTOMY, EXPLORATORY
Anesthesia: General | Site: Abdomen | Wound class: Clean

## 2012-09-07 MED ORDER — GLYCOPYRROLATE 0.2 MG/ML IJ SOLN
INTRAMUSCULAR | Status: DC | PRN
  Administered 2012-09-07: 0.6 mg via INTRAVENOUS

## 2012-09-07 MED ORDER — LACTATED RINGERS IV SOLN
INTRAVENOUS | Status: DC | PRN
  Administered 2012-09-07 (×5): via INTRAVENOUS

## 2012-09-07 MED ORDER — PROPOFOL 10 MG/ML IV BOLUS
INTRAVENOUS | Status: DC | PRN
  Administered 2012-09-07: 200 mg via INTRAVENOUS

## 2012-09-07 MED ORDER — DIPHENHYDRAMINE HCL 12.5 MG/5ML PO ELIX: 12.5000 mg | ORAL_SOLUTION | Freq: Four times a day (QID) | ORAL | Status: DC | PRN

## 2012-09-07 MED ORDER — PROMETHAZINE HCL 25 MG/ML IJ SOLN: 6.2500 mg | INTRAMUSCULAR | Status: DC | PRN

## 2012-09-07 MED ORDER — LACTATED RINGERS IV SOLN
INTRAVENOUS | Status: DC
  Administered 2012-09-07: 1000 mL via INTRAVENOUS

## 2012-09-07 MED ORDER — HYDROMORPHONE HCL PF 1 MG/ML IJ SOLN
INTRAMUSCULAR | Status: AC
  Filled 2012-09-07: qty 1

## 2012-09-07 MED ORDER — FENTANYL CITRATE 0.05 MG/ML IJ SOLN
INTRAMUSCULAR | Status: DC | PRN
  Administered 2012-09-07 (×2): 50 ug via INTRAVENOUS
  Administered 2012-09-07 (×4): 100 ug via INTRAVENOUS

## 2012-09-07 MED ORDER — ENOXAPARIN SODIUM 40 MG/0.4ML ~~LOC~~ SOLN
40.0000 mg | SUBCUTANEOUS | Status: DC
  Administered 2012-09-08 – 2012-09-13 (×6): 40 mg via SUBCUTANEOUS
  Filled 2012-09-07 (×8): qty 0.4

## 2012-09-07 MED ORDER — PHENYLEPHRINE HCL 10 MG/ML IJ SOLN
INTRAMUSCULAR | Status: DC | PRN
  Administered 2012-09-07 (×3): 80 ug via INTRAVENOUS

## 2012-09-07 MED ORDER — PHENYLEPHRINE HCL 10 MG/ML IJ SOLN
20.0000 mg | INTRAVENOUS | Status: DC | PRN
  Administered 2012-09-07: 100 ug/min via INTRAVENOUS

## 2012-09-07 MED ORDER — LACTATED RINGERS IV BOLUS (SEPSIS)
1000.0000 mL | Freq: Once | INTRAVENOUS | Status: AC
  Administered 2012-09-07: 1000 mL via INTRAVENOUS

## 2012-09-07 MED ORDER — ROCURONIUM BROMIDE 100 MG/10ML IV SOLN
INTRAVENOUS | Status: DC | PRN
  Administered 2012-09-07: 20 mg via INTRAVENOUS
  Administered 2012-09-07 (×2): 10 mg via INTRAVENOUS
  Administered 2012-09-07: 50 mg via INTRAVENOUS

## 2012-09-07 MED ORDER — SUCCINYLCHOLINE CHLORIDE 20 MG/ML IJ SOLN
INTRAMUSCULAR | Status: DC | PRN
  Administered 2012-09-07: 100 mg via INTRAVENOUS

## 2012-09-07 MED ORDER — DEXTROSE 5 % IV SOLN
1.0000 g | Freq: Four times a day (QID) | INTRAVENOUS | Status: AC
  Administered 2012-09-07: 1 g via INTRAVENOUS
  Filled 2012-09-07: qty 1

## 2012-09-07 MED ORDER — LIDOCAINE HCL (CARDIAC) 20 MG/ML IV SOLN
INTRAVENOUS | Status: DC | PRN
  Administered 2012-09-07: 80 mg via INTRAVENOUS

## 2012-09-07 MED ORDER — DIPHENHYDRAMINE HCL 50 MG/ML IJ SOLN: 12.5000 mg | Freq: Four times a day (QID) | INTRAMUSCULAR | Status: DC | PRN

## 2012-09-07 MED ORDER — NEOSTIGMINE METHYLSULFATE 1 MG/ML IJ SOLN
INTRAMUSCULAR | Status: DC | PRN
  Administered 2012-09-07: 4 mg via INTRAVENOUS

## 2012-09-07 MED ORDER — LIDOCAINE HCL (CARDIAC) 20 MG/ML IV SOLN: INTRAVENOUS | Status: DC | PRN

## 2012-09-07 MED ORDER — NALOXONE HCL 0.4 MG/ML IJ SOLN: 0.4000 mg | INTRAMUSCULAR | Status: DC | PRN

## 2012-09-07 MED ORDER — SODIUM CHLORIDE 0.9 % IJ SOLN: 9.0000 mL | INTRAMUSCULAR | Status: DC | PRN

## 2012-09-07 MED ORDER — MIDAZOLAM HCL 5 MG/5ML IJ SOLN
INTRAMUSCULAR | Status: DC | PRN
  Administered 2012-09-07: 2 mg via INTRAVENOUS

## 2012-09-07 MED ORDER — ONDANSETRON HCL 4 MG/2ML IJ SOLN
INTRAMUSCULAR | Status: DC | PRN
  Administered 2012-09-07: 4 mg via INTRAVENOUS

## 2012-09-07 MED ORDER — LACTATED RINGERS IV SOLN
INTRAVENOUS | Status: DC
  Administered 2012-09-07 – 2012-09-08 (×3): via INTRAVENOUS

## 2012-09-07 MED ORDER — DEXTROSE 5 % IV SOLN
2.0000 g | INTRAVENOUS | Status: AC
  Administered 2012-09-07: 2 g via INTRAVENOUS

## 2012-09-07 MED ORDER — MORPHINE SULFATE (PF) 1 MG/ML IV SOLN
INTRAVENOUS | Status: DC
  Administered 2012-09-07: 15:00:00 via INTRAVENOUS
  Administered 2012-09-07: 4.5 mg via INTRAVENOUS
  Administered 2012-09-08: 9.59 mg via INTRAVENOUS
  Administered 2012-09-08 (×2): via INTRAVENOUS
  Administered 2012-09-08: 25.5 mg via INTRAVENOUS
  Administered 2012-09-08: 6 mg via INTRAVENOUS
  Administered 2012-09-08: 4.5 mg via INTRAVENOUS
  Administered 2012-09-08: 9 mg via INTRAVENOUS
  Administered 2012-09-08: 5.92 mg via INTRAVENOUS
  Administered 2012-09-08: 23:00:00 via INTRAVENOUS
  Administered 2012-09-08: 9 mg via INTRAVENOUS
  Administered 2012-09-09: 12:00:00 via INTRAVENOUS
  Administered 2012-09-09: 7.5 mg via INTRAVENOUS
  Administered 2012-09-09: 17.23 mg via INTRAVENOUS
  Administered 2012-09-09: 22:00:00 via INTRAVENOUS
  Administered 2012-09-09: 1.5 mg via INTRAVENOUS
  Administered 2012-09-09: 9.98 mg via INTRAVENOUS
  Administered 2012-09-09: 3 mg via INTRAVENOUS
  Administered 2012-09-10: 1.5 mg via INTRAVENOUS
  Administered 2012-09-10: 12 mg via INTRAVENOUS
  Administered 2012-09-10: 17:00:00 via INTRAVENOUS
  Administered 2012-09-10 (×2): 3 mg via INTRAVENOUS
  Administered 2012-09-11: 1.5 mg via INTRAVENOUS
  Administered 2012-09-11 (×3): 3 mg via INTRAVENOUS
  Administered 2012-09-12: 4.5 mg via INTRAVENOUS
  Administered 2012-09-12: 6 mg via INTRAVENOUS
  Administered 2012-09-12: 08:00:00 via INTRAVENOUS
  Filled 2012-09-07 (×7): qty 25

## 2012-09-07 MED ORDER — ONDANSETRON HCL 4 MG/2ML IJ SOLN: 4.0000 mg | Freq: Four times a day (QID) | INTRAMUSCULAR | Status: DC | PRN

## 2012-09-07 MED ORDER — HYDROMORPHONE HCL PF 1 MG/ML IJ SOLN
0.2500 mg | INTRAMUSCULAR | Status: DC | PRN
  Administered 2012-09-07 (×4): 0.5 mg via INTRAVENOUS

## 2012-09-07 SURGICAL SUPPLY — 56 items
BLADE EXTENDED COATED 6.5IN (ELECTRODE) ×2 IMPLANT
BLADE HEX COATED 2.75 (ELECTRODE) ×2 IMPLANT
BLADE SURG SZ10 CARB STEEL (BLADE) IMPLANT
CANISTER SUCTION 2500CC (MISCELLANEOUS) ×2 IMPLANT
CHLORAPREP W/TINT 26ML (MISCELLANEOUS) ×4 IMPLANT
CLIP TI LARGE 6 (CLIP) ×4 IMPLANT
CLOTH BEACON ORANGE TIMEOUT ST (SAFETY) ×2 IMPLANT
COVER MAYO STAND STRL (DRAPES) ×2 IMPLANT
DRAIN CHANNEL 19F RND (DRAIN) ×2 IMPLANT
DRAPE LAPAROSCOPIC ABDOMINAL (DRAPES) ×2 IMPLANT
DRAPE LG THREE QUARTER DISP (DRAPES) ×2 IMPLANT
DRAPE UTILITY XL STRL (DRAPES) ×2 IMPLANT
DRAPE WARM FLUID 44X44 (DRAPE) ×2 IMPLANT
DRSG PAD ABDOMINAL 8X10 ST (GAUZE/BANDAGES/DRESSINGS) ×2 IMPLANT
ELECT REM PT RETURN 9FT ADLT (ELECTROSURGICAL) ×2
ELECTRODE REM PT RTRN 9FT ADLT (ELECTROSURGICAL) ×1 IMPLANT
EVACUATOR 1/8 PVC DRAIN (DRAIN) ×2 IMPLANT
GLOVE BIO SURGEON STRL SZ 6.5 (GLOVE) ×6 IMPLANT
GLOVE BIOGEL PI IND STRL 7.0 (GLOVE) ×1 IMPLANT
GLOVE BIOGEL PI INDICATOR 7.0 (GLOVE) ×1
GOWN BRE IMP PREV XXLGXLNG (GOWN DISPOSABLE) ×2 IMPLANT
GOWN STRL REIN XL XLG (GOWN DISPOSABLE) ×4 IMPLANT
KIT BASIN OR (CUSTOM PROCEDURE TRAY) ×2 IMPLANT
LEGGING LITHOTOMY PAIR STRL (DRAPES) IMPLANT
LIGASURE IMPACT 36 18CM CVD LR (INSTRUMENTS) IMPLANT
NS IRRIG 1000ML POUR BTL (IV SOLUTION) ×4 IMPLANT
PACK GENERAL/GYN (CUSTOM PROCEDURE TRAY) ×2 IMPLANT
RELOAD PROXIMATE 75MM GREEN (ENDOMECHANICALS) ×6 IMPLANT
RELOAD STAPLER LINEAR PROX 30 (STAPLE) ×1 IMPLANT
SCALPEL HARMONIC ACE (MISCELLANEOUS) IMPLANT
SLEEVE SURGEON STRL (DRAPES) ×2 IMPLANT
SPONGE GAUZE 4X4 12PLY (GAUZE/BANDAGES/DRESSINGS) ×2 IMPLANT
SPONGE LAP 18X18 X RAY DECT (DISPOSABLE) ×16 IMPLANT
STAPLER PROXIMAT 75MM THCK GRN (MISCELLANEOUS) ×2 IMPLANT
STAPLER RELOAD LINEAR PROX 30 (STAPLE) ×2
STAPLER VISISTAT 35W (STAPLE) ×2 IMPLANT
SUCTION POOLE TIP (SUCTIONS) ×2 IMPLANT
SUT NOV 1 T60/GS (SUTURE) IMPLANT
SUT NOVA NAB GS-21 0 18 T12 DT (SUTURE) ×2 IMPLANT
SUT NOVA NAB GS-22 2 0 T19 (SUTURE) ×2 IMPLANT
SUT PDS AB 1 CTX 36 (SUTURE) IMPLANT
SUT PDS AB 1 TP1 96 (SUTURE) IMPLANT
SUT SILK 2 0 (SUTURE) ×2
SUT SILK 2 0 SH CR/8 (SUTURE) ×4 IMPLANT
SUT SILK 2 0SH CR/8 30 (SUTURE) ×2 IMPLANT
SUT SILK 2-0 18XBRD TIE 12 (SUTURE) ×1 IMPLANT
SUT SILK 2-0 30XBRD TIE 12 (SUTURE) ×1 IMPLANT
SUT SILK 3 0 (SUTURE)
SUT SILK 3 0 SH CR/8 (SUTURE) ×2 IMPLANT
SUT SILK 3-0 18XBRD TIE 12 (SUTURE) IMPLANT
SUT VIC AB 3-0 54XBRD REEL (SUTURE) IMPLANT
SUT VIC AB 3-0 BRD 54 (SUTURE)
TAPE CLOTH SURG 4X10 WHT LF (GAUZE/BANDAGES/DRESSINGS) ×2 IMPLANT
TOWEL OR 17X26 10 PK STRL BLUE (TOWEL DISPOSABLE) ×4 IMPLANT
TRAY FOLEY CATH 14FRSI W/METER (CATHETERS) ×2 IMPLANT
YANKAUER SUCT BULB TIP NO VENT (SUCTIONS) ×2 IMPLANT

## 2012-09-07 NOTE — Op Note (Signed)
09/03/2012 - 09/07/2012  2:58 PM  PATIENT:  Monica [name]  47 y.o. female  Patient has no care team.  PRE-OPERATIVE DIAGNOSIS:  abdominal mass, anemia  POST-OPERATIVE DIAGNOSIS:  abdominal mass, anemia  PROCEDURE:  EXPLORATORY LAPAROTOMY PARTIAL GASTRECTOMY/RESECTION OF ABDOMINAL MASS  SURGEON:  Surgeon(s): Adolph Pollack, MD Romie Levee, MD  ASSISTANT: Abbey Chatters   ANESTHESIA:   general  EBL:  Total I/O In: 5056 [I.V.:4500; Blood:556] Out: 1975 [Urine:475; Blood:1500]  Delay start of Pharmacological VTE agent (>24hrs) due to surgical blood loss or risk of bleeding:  no  DRAINS: (42F) Jackson-Pratt drain(s) with closed bulb suction in the RUQ   SPECIMEN:  Source of Specimen:  Submucosa mass on lesser curvature of the stomach, gastric wall margin, partial omentum resection  DISPOSITION OF SPECIMEN:  PATHOLOGY  COUNTS:  YES  PLAN OF CARE: Pt admitted, will transfer to stepdown  PATIENT DISPOSITION:  PACU - hemodynamically stable.  INDICATION: this is a 47 year old female who presented to the emergency department with syncope and anemia.  An EGD was performed. This showed some friable tissue noted along the lesser curve of the stomach and a submucosal mass. A CT scan revealed a 15 cm mass located along the lesser curvature of the stomach. The risk and benefits of resection were split to the patient prior to the OR and consent was signed and placed on chart.  OR FINDINGS: partial tumor rupture with intra-abdominal hematoma, friable necrotic tissue within tumor capsule, no other signs of metastatic disease.  DESCRIPTION: The patient was identified in the preoperative holding area and taken to the OR where they were laid supine on the operating room table. Gen. anesthesia was smoothly induced.  A Foley catheter was inserted under sterile conditions.  The patient was then prepped and draped in the usual sterile fashion. A surgical timeout was performed indicating the  correct patient, procedure, positioning and preoperative antibiotics. SCDs were noted to be in place and functioning prior to the operation. I began by making an upper midline incision using a 10 blade scalpel. Dissection was carried down to the level of the fascia using Bovie electric artery. The fascia was entered and the peritoneum was elevated using 2 hemostats. It was then incised using Metzenbaum scissors. The abdomen was then completely opened. A Bookwalker clamp was placed for retraction.  The mass was identified within the lesser sac. The stomach wall was adherent to the anterior portion of the mass. There was a perforation of the mass with abdominal hematoma and perforation of tumor in the mid left upper quadrant. The omentumwas also adherent to this. The omental attachment was resected and suture ligated. The tumor capsule was identified and dissection from the gastric wall was attempted. The lesser curve of the stomach was completely necrotic. The tumor was then removed using mostly blunt dissection. A portion of the tumor that was adherent to the submucosal region of the lesser curvature was resected using suture ligatures. The left gastric artery and vein were divided also using suture ligatures. Once all the tumor was removed, the remaining tumor sac was dissected free from the gastric wall. This was also sent to pathology for further examination. After this was completed the lesser curvature of the stomach was stapled closed over a 56 mm lighted bougie to preserve the esophageal patency.  This was sent to pathology as gastric wall margin.  The staple line was then oversewn using interrupted 2-0 silk sutures with the bougie in place. The bougie was then removed  and an NG tube was placed past the area of the staple line and ended just proximal to the pylorus. This was then secured into place. The abdomen was then irrigated with 2-3 L of sterile warm water. All hematoma was removed. All visible tumor  was removed. The pelvis was palpated. There were no signs of metastatic disease. The peritoneum was smooth without nodule or implants. The liver appeared normal with no signs of metastatic disease. The spleen was palpated and also felt to be normal size. After this was completed, a 47 [place] Blake drain was placed along the staple line and brought out through the right upper quadrant. This was sutured into place with a 2-0 nylon suture. The omentum was then placed in between the liver and the staple line. After this I closed the fascia using #1 looped PDS sutures. The subcutaneous tissue were closed using interrupted 2-0 Vicryl sutures. The skin was closed using staples. A sterile dressing was applied. The patient was then awakened from anesthesia and sent to the post anesthesia care unit in stable condition. All counts were correct per operating room staff.

## 2012-09-07 NOTE — Progress Notes (Signed)
[facility] PROGRESS NOTE  Monica [name] ZOX:[id number] DOB: [birth year] DOA: 09/03/2012 PCP: No primary provider on file.  Brief narrative: 47 y.o. female with history of multiple sclerosis, hypertension and hyperlipidemia who presented to [facility] ED status post syncopal event on the day of the admission. Patient reported not having similar events in past. She reported having nausea and non bloody vomiting associated with dark stool.for past 1-2 days prior to this admission Pt reported she does not take aspirin but occasionally takes ibuprofen.  In ED, pt remained hemodynamically stable. CBC revealed hemoglobin of 8 which trended down to 7. GI consulted and pt underwent subsequent EGD with findings of gastric submucosal mass (favored over extrinsic compression) in the gastric body with ulceration at the center (most likely source of bleeding/anemia). CT abdomen further revealed 15.4 x 13.8 x 10.4 cm upper abdominal mass which could represent a carcinoma or sarcoma arising from the stomach or pancreas with no evidence of metastatic disease. Surgery was consulted for furhter input on management and plan is for surgical exploration with possible resection of the gastric tumor.   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)  - pt has received 2 units PRBC's 09/04/2012 with appropriate rise in hemoglobin 7 --> 9.2 --> 9.5 - status post EGD 09/04/2012 with findings of gastric submucosal mass as likely a source of bleeding  - per GI recommendations continue protonix twice a day  - appreciate surgery recommendations, plan for exploration and possible resection of the gastric tumor today Submucosal lesion of stomach  - Management as above  per surgery, resection of gastric tumor  Protein-calorie malnutrition, severe  - Nutrition consulted  - NPO  Hypokalemia  - Secondary to GI losses  - Repleted today  History of multiple sclerosis  - on glatiramer but now on hold as we do not have this medication in pharmacy and pt has no one at home to bring this medication HTN (hypertension)  - hydralazine PRN  Hyperlipidemia  - continue simvastatin    Code Status: full code  Family Communication: no family at the bedside  Disposition Plan: home when stable   Manson Passey, MD  [facility]  Pager [phone]   Consultants:  GI (Dr. Rhea Belton)  Surgery (Dr. Johna Sheriff and Dr. Maisie Fus) Procedures:  EGD 09/04/2012 with findings of gastric submucosal mass  Exploratory laparotomy 09/07/2012 Antibiotics:  None    If 7PM-7AM, please contact night-coverage [email or web address] Password [facility] 09/07/2012, 12:39 PM   LOS: 4 days    HPI/Subjective: No overnight events.  Objective: Filed Vitals:   09/06/12 0528 09/06/12 1424 09/06/12 2143 09/07/12 0700  BP: 139/75 137/76 121/69 127/74  Pulse: 66 62 67 103  Temp: 98.9 F (37.2 C) 98.2 F (36.8 C) 99.1 F (37.3 C) 98.5 F (36.9 C)  TempSrc: Oral Oral Oral Oral  Resp: 18 20 18 22   Height:      Weight:      SpO2: 97% 100% 99% 97%    Intake/Output Summary (Last 24 hours) at 09/07/12 1239 Last data filed at 09/07/12 0706  Gross per 24 hour  Intake   1435 ml  Output   1800 ml  Net   -365 ml    Exam:  General:  Pt is alert, follows commands appropriately, not in acute distress  Cardiovascular: Regular rate and rhythm, S1/S2, no murmurs, no rubs, no gallops  Respiratory: Clear to auscultation bilaterally, no wheezing, no crackles, no rhonchi  Abdomen: Soft, non tender, non distended, bowel sounds present, no guarding  Extremities: No edema, pulses DP and PT palpable bilaterally  Neuro: Grossly nonfocal  Data Reviewed: Basic Metabolic Panel:  Recent Labs Lab 09/03/12 1918  09/04/12 0659 09/06/12 0435  NA 136 138 138  K 3.6 3.4* 3.1*  CL 102 106 105  CO2 26 25 24   GLUCOSE 115* 88 92  BUN 8 8 3*  CREATININE 1.07 0.91 0.89  CALCIUM 8.5 8.4 8.3*   Liver Function Tests:  Recent Labs Lab 09/03/12 1918 09/04/12 0659  AST 11 9  ALT 6 6  ALKPHOS 41 35*  BILITOT 0.3 0.3  PROT 6.5 5.6*  ALBUMIN 3.4* 3.0*    Recent Labs Lab 09/03/12 1918  LIPASE 98*   No results found for this basename: AMMONIA,  in the last 168 hours CBC:  Recent Labs Lab 09/03/12 1918 09/04/12 0215 09/04/12 0659 09/04/12 1059 09/05/12 0815 09/06/12 0435  WBC 6.1 5.2 4.0 3.8* 6.1 5.3  NEUTROABS 5.0  --   --   --   --   --   HGB 8.0* 7.6* 7.0* 7.2* 9.2* 9.5*  HCT 24.3* 23.6* 21.7* 22.2* 27.5* 29.2*  MCV 91.4 90.4 90.8 91.0 89.3 91.0  PLT 205 204 183 198 221 224   Cardiac Enzymes: No results found for this basename: CKTOTAL, CKMB, CKMBINDEX, TROPONINI,  in the last 168 hours BNP: No components found with this basename: POCBNP,  CBG:  Recent Labs Lab 09/06/12 0627 09/06/12 1139 09/06/12 1748 09/07/12 0011 09/07/12 0716  GLUCAP 82 93 86 94 112*    SURGICAL PCR SCREEN     Status: None   Collection Time    09/06/12  6:31 AM      Result Value Range Status   MRSA, PCR NEGATIVE  NEGATIVE Final   Staphylococcus aureus NEGATIVE  NEGATIVE Final  SURGICAL PCR SCREEN     Status: None   Collection Time    09/07/12  8:24 AM      Result Value Range Status   MRSA, PCR NEGATIVE  NEGATIVE Final   Staphylococcus aureus NEGATIVE  NEGATIVE Final     Studies: No results found.  Scheduled Meds: . [MAR HOLD] pantoprazole (PROTONIX) IV  40 mg Intravenous Q12H  . [facility] HOLD] simvastatin  10 mg Oral QHS  . [facility] HOLD] sodium chloride  3 mL Intravenous Q12H   Continuous Infusions: . sodium chloride 20 mL/hr (09/06/12 0644)  . lactated ringers 1,000 mL (09/07/12 1118)

## 2012-09-07 NOTE — Anesthesia Preprocedure Evaluation (Addendum)
Anesthesia Evaluation  Patient identified by MRN, date of birth, ID band Patient awake    Reviewed: Allergy & Precautions, H&P , NPO status , Patient's Chart, lab work & pertinent test results  Airway Mallampati: II TM Distance: >3 FB Neck ROM: Full    Dental no notable dental hx.    Pulmonary neg pulmonary ROS,  breath sounds clear to auscultation  Pulmonary exam normal       Cardiovascular Exercise Tolerance: Good hypertension, Pt. on medications negative cardio ROS  Rhythm:Regular Rate:Normal  HGB 9.5   Neuro/Psych Multiple Sclerosis, now in remission. Had left eye blindness, right hand clenching and gait disturbances which have all resolved. negative psych ROS   GI/Hepatic Neg liver ROS, PUD,   Endo/Other  negative endocrine ROS  Renal/GU negative Renal ROS  negative genitourinary   Musculoskeletal negative musculoskeletal ROS (+)   Abdominal (+) + obese,   Peds negative pediatric ROS (+)  Hematology negative hematology ROS (+)   Anesthesia Other Findings   Reproductive/Obstetrics negative OB ROS                         Anesthesia Physical Anesthesia Plan  ASA: III  Anesthesia Plan: General   Post-op Pain Management:    Induction: Intravenous  Airway Management Planned: Oral ETT  Additional Equipment:   Intra-op Plan:   Post-operative Plan: Extubation in OR  Informed Consent: I have reviewed the patients History and Physical, chart, labs and discussed the procedure including the risks, benefits and alternatives for the proposed anesthesia with the patient or authorized representative who has indicated his/her understanding and acceptance.   Dental advisory given  Plan Discussed with: CRNA  Anesthesia Plan Comments:         Anesthesia Quick Evaluation

## 2012-09-07 NOTE — Transfer of Care (Signed)
Immediate Anesthesia Transfer of Care Note  Patient: Monica [name]  Procedure(s) Performed: Procedure(s): EXPLORATORY LAPAROTOMY (N/A) PARTIAL GASTRECTOMY/RESECTION OF ABDOMINAL MASS (N/A)  Patient Location: PACU  Anesthesia Type:General  Level of Consciousness: awake and alert   Airway & Oxygen Therapy: Patient Spontanous Breathing and Patient connected to face mask oxygen  Post-op Assessment: Report given to PACU RN and Post -op Vital signs reviewed stable Post vital signs: Reviewed and stable  Complications: No apparent anesthesia complications

## 2012-09-07 NOTE — Progress Notes (Signed)
Syncope, UGI bleed  Subjective: HPI: we were asked to see this pleasant 47 year old female who was admitted following a syncopal episode. She has been found to have a significant GI bleed as a cause for her syncope. The patient states that she has been having some symptoms for about 2 months. She has noted nausea and occasional vomiting after eating. This has gradually worsened. For several weeks she has had pain in her left upper quadrant particularly after eating. She is also noted black tarry stools for about 2 weeks. After a day of shopping 2 days ago she developed lightheadedness and then a syncopal episode and was admitted. She was found to have heme positive stool. Hemoglobin was 8 and felt to 7 with rehydration. She has received 2 units of blood transfusion. She has been hemodynamically stable.  Upper endoscopy was performed Sat, which I have reviewed. Showed significant external compression of the stomach with a submucosal or external mass with an overlying erosion and ulceration which appeared to be the source of bleeding without active hemorrhage.  CT scan has subsequently been performed which I have personally reviewed. This shows a very large, 15 cm, low-density mass anterior and inferior to the stomach and anterior to the pancreas. It is causing partial obstruction of the stomach.  The patient currently is in no distress.  Objective: Vital signs in last 24 hours: Temp:  [98.2 F (36.8 C)-99.1 F (37.3 C)] 98.5 F (36.9 C) (08/26 0700) Pulse Rate:  [62-103] 103 (08/26 0700) Resp:  [18-22] 22 (08/26 0700) BP: (121-137)/(69-76) 127/74 mmHg (08/26 0700) SpO2:  [97 %-100 %] 97 % (08/26 0700) Last BM Date: 09/05/12  Intake/Output from previous day: 08/25 0701 - 08/26 0700 In: 1555.3 [P.O.:850; I.V.:305.3; IV Piggyback:400] Out: 1150 [Urine:1150] Intake/Output this shift: Total I/O In: -  Out: 400 [Urine:400]  General appearance: alert and cooperative GI: normal findings: soft,  non-tender Large mass palpated within upper abd Lab Results:  Results for orders placed during the hospital encounter of 09/03/12 (from the past 24 hour(s))  GLUCOSE, CAPILLARY     Status: None   Collection Time    09/06/12 11:39 AM      Result Value Range   Glucose-Capillary 93  70 - 99 mg/dL   Comment 1 Documented in Chart     Comment 2 Notify RN    GLUCOSE, CAPILLARY     Status: None   Collection Time    09/06/12  5:48 PM      Result Value Range   Glucose-Capillary 86  70 - 99 mg/dL   Comment 1 Notify RN     Comment 2 Documented in Chart    GLUCOSE, CAPILLARY     Status: None   Collection Time    09/07/12 12:11 AM      Result Value Range   Glucose-Capillary 94  70 - 99 mg/dL   Comment 1 Notify RN    GLUCOSE, CAPILLARY     Status: Abnormal   Collection Time    09/07/12  7:16 AM      Result Value Range   Glucose-Capillary 112 (*) 70 - 99 mg/dL   Comment 1 Notify RN       Studies/Results Radiology     MEDS, Scheduled . glatiramer  20 mg Subcutaneous Daily  . pantoprazole (PROTONIX) IV  40 mg Intravenous Q12H  . simvastatin  10 mg Oral QHS  . sodium chloride  3 mL Intravenous Q12H     Assessment: Syncope UGI bleed Upper  abd mass  Plan: We discussed possible surgical intervention, extent and possibilities of surgery, length of stay and expectations after surgery. We discussed possible risks of surgery that include bleeding, infection, damage to adjacent structures, leak of staple line, abscess and recurrence. I believe she understands these risks and has agreed to proceed.      LOS: 4 days    Vanita Panda, MD [facility], [place] [phone]   09/07/2012 7:57 AM

## 2012-09-07 NOTE — Progress Notes (Signed)
The patient had the Hibiclens wash at 0655. Will continue to monitor the patient.

## 2012-09-07 NOTE — Anesthesia Postprocedure Evaluation (Signed)
  Anesthesia Post-op Note  Patient: Monica [name]  Procedure(s) Performed: Procedure(s) (LRB): EXPLORATORY LAPAROTOMY (N/A) PARTIAL GASTRECTOMY/RESECTION OF ABDOMINAL MASS (N/A)  Patient Location: PACU  Anesthesia Type: General  Level of Consciousness: awake and alert   Airway and Oxygen Therapy: Patient Spontanous Breathing  Post-op Pain: mild  Post-op Assessment: Post-op Vital signs reviewed, Patient's Cardiovascular Status Stable, Respiratory Function Stable, Patent Airway and No signs of Nausea or vomiting  Last Vitals:  Filed Vitals:   09/07/12 1609  BP: 94/65  Pulse: 82  Temp:   Resp: 15    Post-op Vital Signs: stable   Complications: No apparent anesthesia complications. To stepdown unit per plan. Required IV bolus in pacu for low BP

## 2012-09-08 ENCOUNTER — Encounter (HOSPITAL_COMMUNITY): Payer: Self-pay | Admitting: General Surgery

## 2012-09-08 LAB — TYPE AND SCREEN
ABO/RH(D): O POS
Antibody Screen: NEGATIVE
Unit division: 0
Unit division: 0
Unit division: 0
Unit division: 0
Unit division: 0
Unit division: 0

## 2012-09-08 LAB — BASIC METABOLIC PANEL
Calcium: 7.1 mg/dL — ABNORMAL LOW (ref 8.4–10.5)
GFR calc Af Amer: 35 mL/min — ABNORMAL LOW (ref >90)
GFR calc non Af Amer: 30 mL/min — ABNORMAL LOW (ref >90)
Potassium: 5.2 mEq/L — ABNORMAL HIGH (ref 3.5–5.1)
Sodium: 132 mEq/L — ABNORMAL LOW (ref 135–145)

## 2012-09-08 LAB — GLUCOSE, CAPILLARY: Glucose-Capillary: 122 mg/dL — ABNORMAL HIGH (ref 70–99)

## 2012-09-08 LAB — POCT I-STAT EG7
Acid-base deficit: 4 mmol/L — ABNORMAL HIGH (ref 0.0–2.0)
Calcium, Ion: 1.05 mmol/L — ABNORMAL LOW (ref 1.12–1.23)
HCT: 29 % — ABNORMAL LOW (ref 36.0–46.0)
pH, Ven: 7.425 — ABNORMAL HIGH (ref 7.250–7.300)
pO2, Ven: 31 mmHg (ref 30.0–45.0)

## 2012-09-08 LAB — CBC
MCH: 28.5 pg (ref 26.0–34.0)
MCHC: 33.2 g/dL (ref 30.0–36.0)
Platelets: 194 10*3/uL (ref 150–400)
RDW: 16.2 % — ABNORMAL HIGH (ref 11.5–15.5)

## 2012-09-08 MED ORDER — KCL IN DEXTROSE-NACL 20-5-0.45 MEQ/L-%-% IV SOLN
INTRAVENOUS | Status: DC
  Administered 2012-09-08 – 2012-09-10 (×4): via INTRAVENOUS
  Administered 2012-09-11: 125 mL/h via INTRAVENOUS
  Administered 2012-09-11 (×2): via INTRAVENOUS
  Filled 2012-09-08 (×13): qty 1000

## 2012-09-08 MED ORDER — SODIUM CHLORIDE 0.9 % IV SOLN
INTRAVENOUS | Status: DC
  Administered 2012-09-08: 09:00:00 via INTRAVENOUS

## 2012-09-08 MED ORDER — BIOTENE DRY MOUTH MT LIQD
15.0000 mL | Freq: Two times a day (BID) | OROMUCOSAL | Status: DC
  Administered 2012-09-08 – 2012-09-10 (×5): 15 mL via OROMUCOSAL

## 2012-09-08 MED ORDER — SODIUM CHLORIDE 0.9 % IV SOLN
25.0000 mg | Freq: Four times a day (QID) | INTRAVENOUS | Status: DC | PRN
  Administered 2012-09-08: 25 mg via INTRAVENOUS
  Filled 2012-09-08 (×2): qty 1

## 2012-09-08 MED ORDER — CHLORHEXIDINE GLUCONATE 0.12 % MT SOLN
15.0000 mL | Freq: Two times a day (BID) | OROMUCOSAL | Status: DC
  Administered 2012-09-08 – 2012-09-12 (×8): 15 mL via OROMUCOSAL
  Filled 2012-09-08 (×12): qty 15

## 2012-09-08 NOTE — Progress Notes (Signed)
1 Day Post-Op partial gastrectomy.   Subjective: Doing well.  Some hypotension overnight, but responding to IVF's, pain controlled with PCA, UOP marginal  Objective: Vital signs in last 24 hours: Temp:  [97.6 F (36.4 C)-99.3 F (37.4 C)] 97.6 F (36.4 C) (08/27 0800) Pulse Rate:  [62-104] 80 (08/27 1200) Resp:  [0-24] 19 (08/27 1230) BP: (74-121)/(46-81) 121/58 mmHg (08/27 1200) SpO2:  [93 %-100 %] 100 % (08/27 1230)   Intake/Output from previous day: 08/26 0701 - 08/27 0700 In: 6935.2 [I.V.:6379.2; Blood:556] Out: 2390 [Urine:650; Emesis/NG output:100; Drains:140; Blood:1500] Intake/Output this shift: Total I/O In: 768.5 [I.V.:738.5; NG/GT:30] Out: 180 [Urine:180]   General appearance: alert and cooperative GI: soft, non-distended, appropriately tender  Incision: no significant drainage  Lab Results:   Recent Labs  09/07/12 1855 09/08/12 0313  WBC 23.3* 17.7*  HGB 9.7* 8.6*  HCT 28.7* 25.9*  PLT 232 194   BMET  Recent Labs  09/06/12 0435 09/07/12 1356 09/08/12 0313  NA 138 138 132*  K 3.1* 4.9 5.2*  CL 105  --  102  CO2 24  --  22  GLUCOSE 92  --  135*  BUN 3*  --  12  CREATININE 0.89  --  1.93*  CALCIUM 8.3*  --  7.1*   PT/INR No results found for this basename: LABPROT, INR,  in the last 72 hours ABG  Recent Labs  09/07/12 1356  HCO3 19.8*    MEDS, Scheduled . antiseptic oral rinse  15 mL Mouth Rinse q12n4p  . chlorhexidine  15 mL Mouth Rinse BID  . enoxaparin (LOVENOX) injection  40 mg Subcutaneous Q24H  . morphine   Intravenous Q4H  . pantoprazole (PROTONIX) IV  40 mg Intravenous Q12H  . sodium chloride  3 mL Intravenous Q12H    Studies/Results: No results found.  Assessment: s/p Procedure(s): EXPLORATORY LAPAROTOMY PARTIAL GASTRECTOMY/RESECTION OF ABDOMINAL MASS Patient Active Problem List   Diagnosis Date Noted  . Hypokalemia 09/06/2012  . Protein-calorie malnutrition, severe 09/05/2012  . Syncope 09/04/2012  . GI bleed  09/04/2012  . History of multiple sclerosis 09/04/2012  . HTN (hypertension) 09/04/2012  . Hyperlipidemia 09/04/2012  . Acute blood loss anemia 09/04/2012  . Submucosal lesion of stomach 09/04/2012  . Gastric ulcer 09/04/2012    Expected post op course  Plan: appears resuscitated now, will start MIV Cont NG for decompression, will need swallow study prior to starting PO  Transfer to reg bed today Hgb stable ARF- most likely due to hypotension and acute blood loss anemia during surgery.  Extra IVF's given until BP and UOP responded appropriately.  Will recheck labs in AM    LOS: 5 days     .Vanita Panda, MD [facility], [place] [phone]   09/08/2012 1:33 PM

## 2012-09-08 NOTE — Progress Notes (Signed)
Patient ID: Monica [name], female   DOB: 01/25/1965, 47 y.o.   MRN: [id number] Syncope, UGI bleed  Subjective: Doing ok but some breakthrough pain, denies n/v, no other complaints  Objective: Vital signs in last 24 hours: Temp:  [97.8 F (36.6 C)-99.3 F (37.4 C)] 98.1 F (36.7 C) (08/27 0400) Pulse Rate:  [62-104] 80 (08/27 0700) Resp:  [0-17] 0 (08/27 0700) BP: (74-119)/(46-81) 92/64 mmHg (08/27 0700) SpO2:  [93 %-100 %] 100 % (08/27 0700) Last BM Date: 09/05/12  Intake/Output from previous day: 08/26 0701 - 08/27 0700 In: 6810.2 [I.V.:6254.2; Blood:556] Out: 2390 [Urine:650; Emesis/NG output:100; Drains:140; Blood:1500] Intake/Output this shift:    General appearance: NAD, alert and cooperative GI: soft, expected tenderness over incision, dressing dry, no bowel sounds   Lab Results:  Results for orders placed during the hospital encounter of 09/03/12 (from the past 24 hour(s))  SURGICAL PCR SCREEN     Status: None   Collection Time    09/07/12  8:24 AM      Result Value Range   MRSA, PCR NEGATIVE  NEGATIVE   Staphylococcus aureus NEGATIVE  NEGATIVE  PREPARE RBC (CROSSMATCH)     Status: None   Collection Time    09/07/12  1:00 PM      Result Value Range   Order Confirmation ORDER PROCESSED BY BLOOD BANK    POCT I-STAT 7, (EG7 V)     Status: Abnormal   Collection Time    09/07/12  1:56 PM      Result Value Range   pH, Ven 7.425 (*) 7.250 - 7.300   pCO2, Ven 30.1 (*) 45.0 - 50.0 mmHg   pO2, Ven 31.0  30.0 - 45.0 mmHg   Bicarbonate 19.8 (*) 20.0 - 24.0 mEq/L   TCO2 21  0 - 100 mmol/L   O2 Saturation 63.0     Acid-base deficit 4.0 (*) 0.0 - 2.0 mmol/L   Sodium 138  135 - 145 mEq/L   Potassium 4.9  3.5 - 5.1 mEq/L   Calcium, Ion 1.05 (*) 1.12 - 1.23 mmol/L   HCT 29.0 (*) 36.0 - 46.0 %   Hemoglobin 9.9 (*) 12.0 - 15.0 g/dL   Sample type VENOUS     Comment NOTIFIED PHYSICIAN    CBC     Status: Abnormal   Collection Time    09/07/12  6:55 PM      Result Value  Range   WBC 23.3 (*) 4.0 - 10.5 K/uL   RBC 3.29 (*) 3.87 - 5.11 MIL/uL   Hemoglobin 9.7 (*) 12.0 - 15.0 g/dL   HCT 95.2 (*) 84.1 - 32.4 %   MCV 87.2  78.0 - 100.0 fL   MCH 29.5  26.0 - 34.0 pg   MCHC 33.8  30.0 - 36.0 g/dL   RDW 40.1 (*) 02.7 - 25.3 %   Platelets 232  150 - 400 K/uL  GLUCOSE, CAPILLARY     Status: Abnormal   Collection Time    09/07/12  8:34 PM      Result Value Range   Glucose-Capillary 185 (*) 70 - 99 mg/dL  GLUCOSE, CAPILLARY     Status: Abnormal   Collection Time    09/07/12 11:40 PM      Result Value Range   Glucose-Capillary 136 (*) 70 - 99 mg/dL   Comment 1 Documented in Chart     Comment 2 Notify RN    CBC     Status: Abnormal   Collection Time  09/08/12  3:13 AM      Result Value Range   WBC 17.7 (*) 4.0 - 10.5 K/uL   RBC 3.02 (*) 3.87 - 5.11 MIL/uL   Hemoglobin 8.6 (*) 12.0 - 15.0 g/dL   HCT 47.8 (*) 29.5 - 62.1 %   MCV 85.8  78.0 - 100.0 fL   MCH 28.5  26.0 - 34.0 pg   MCHC 33.2  30.0 - 36.0 g/dL   RDW 30.8 (*) 65.7 - 84.6 %   Platelets 194  150 - 400 K/uL  BASIC METABOLIC PANEL     Status: Abnormal   Collection Time    09/08/12  3:13 AM      Result Value Range   Sodium 132 (*) 135 - 145 mEq/L   Potassium 5.2 (*) 3.5 - 5.1 mEq/L   Chloride 102  96 - 112 mEq/L   CO2 22  19 - 32 mEq/L   Glucose, Bld 135 (*) 70 - 99 mg/dL   BUN 12  6 - 23 mg/dL   Creatinine, Ser 9.62 (*) 0.50 - 1.10 mg/dL   Calcium 7.1 (*) 8.4 - 10.5 mg/dL   GFR calc non Af Amer 30 (*) >90 mL/min   GFR calc Af Amer 35 (*) >90 mL/min  GLUCOSE, CAPILLARY     Status: Abnormal   Collection Time    09/08/12  6:26 AM      Result Value Range   Glucose-Capillary 122 (*) 70 - 99 mg/dL     Studies/Results Radiology     MEDS, Scheduled . enoxaparin (LOVENOX) injection  40 mg Subcutaneous Q24H  . morphine   Intravenous Q4H  . pantoprazole (PROTONIX) IV  40 mg Intravenous Q12H  . sodium chloride  3 mL Intravenous Q12H     Assessment: 1. POD#1-Exp Lap with partial  gastrectomy and resection of abdominal mass 2. UGI bleed 3. Upper abd mass 4. Mild acute renal failure 5. Anemia d/t #2  Plan: 1. Expected tenderness this am, on PCA,  2. Keep NGT 3. start to mobilize today 4. possibly transfer to floor 5. Keep on IVFs and follow renal function, recheck labs in am 6. Anemia stable, follow labs     LOS: 5 days    WHITE, [facility], [place] [phone]   09/08/2012 7:42 AM

## 2012-09-08 NOTE — Progress Notes (Signed)
ATTENDING ADDENDUM:  I personally reviewed patient's record, examined the patient, and formulated the following assessment and plan:  See post op note

## 2012-09-08 NOTE — Consult Note (Signed)
WOC consult Note Reason for Consult: placement of incisional NPWT (VAC) over closed midline incision. Wound type: midline upper abdomen incision, closed with staples Measurement: 13cm  Wound ZOX:WRUEAV incision Drainage (amount, consistency, odor) none Periwound:intact Dressing procedure/placement/frequency: 1pc. Of Mepitel placed over staple line, 1pc of black granufoam placed over Mepitel making sure that foam was more narrow that the Mepitel.  Seal at , pt tolerated well. Dr. Maisie Fus at bedside just prior to dressing placement.   Discussed POC with patient and bedside nurse.  Re consult if needed, will not follow at this time. Thanks  Gianluca Chhim [employer], CWOCN [phone])

## 2012-09-09 LAB — PREPARE RBC (CROSSMATCH)

## 2012-09-09 LAB — HEMOGLOBIN AND HEMATOCRIT, BLOOD: HCT: 22.1 % — ABNORMAL LOW (ref 36.0–46.0)

## 2012-09-09 LAB — CBC
HCT: 17 % — ABNORMAL LOW (ref 36.0–46.0)
MCV: 85.9 fL (ref 78.0–100.0)
RBC: 1.98 MIL/uL — ABNORMAL LOW (ref 3.87–5.11)
RDW: 15.5 % (ref 11.5–15.5)
WBC: 11.2 10*3/uL — ABNORMAL HIGH (ref 4.0–10.5)

## 2012-09-09 LAB — BASIC METABOLIC PANEL
BUN: 14 mg/dL (ref 6–23)
CO2: 24 mEq/L (ref 19–32)
Chloride: 103 mEq/L (ref 96–112)
Creatinine, Ser: 1.52 mg/dL — ABNORMAL HIGH (ref 0.50–1.10)

## 2012-09-09 LAB — GLUCOSE, CAPILLARY: Glucose-Capillary: 92 mg/dL (ref 70–99)

## 2012-09-09 MED ORDER — FUROSEMIDE 10 MG/ML IJ SOLN
20.0000 mg | Freq: Once | INTRAMUSCULAR | Status: AC
  Administered 2012-09-09: 20 mg via INTRAVENOUS
  Filled 2012-09-09: qty 2

## 2012-09-09 MED ORDER — PHENOL 1.4 % MT LIQD
1.0000 | OROMUCOSAL | Status: DC | PRN
  Filled 2012-09-09: qty 177

## 2012-09-09 NOTE — Progress Notes (Signed)
CRITICAL VALUE ALERT  Critical value received:  Hbg: 5.8  Date of notification:  09/09/2012  Time of notification:  0507  Critical value read back:yes  Nurse who received alert:  Pilar Plate, RN  MD notified (1st page): Luretha Murphy, MD  Time of first page:  0509  MD notified (2nd page):  Time of second page:  Responding MD:  Luretha Murphy, MD  Time MD responded:  [phone]

## 2012-09-09 NOTE — Progress Notes (Signed)
Talked with Dr. Derrell Lolling this am about pt.  Pt has a D/C foley order but also had a critical lab value of Hgb: 5.8.  Dr. Derrell Lolling said the only thing keeping him from pulling the foley now is that she has had to be aroused and gotten dizzy when attempting to ambulate.  Told to keep the foley in only until Dr. Maisie Fus has made rounds, and as long as she approves, to take the foley out right after.  Sherron Monday

## 2012-09-09 NOTE — Progress Notes (Signed)
ATTENDING ADDENDUM:  I personally reviewed patient's record, examined the patient, and formulated the following assessment and plan:  Hgb down to 5.8.  Appears dilutional, given normal vitals and no bloody JP output.  2 units given.  Cont NG until output clears then we will get UGI swallow eval.  Renal function seems better.  Foley out today.

## 2012-09-09 NOTE — Progress Notes (Signed)
Patient ID: Monica [name], female   DOB: 08-27-1965, 47 y.o.   MRN: [id number] 2 Days Post-Op partial gastrectomy.   Subjective: Doing ok, no major complaints, pain controlled with PCA, got up to side of bed this am  Objective: Vital signs in last 24 hours: Temp:  [97.6 F (36.4 C)-100.1 F (37.8 C)] 98.1 F (36.7 C) (08/28 0604) Pulse Rate:  [67-115] 105 (08/28 0604) Resp:  [10-24] 18 (08/28 0604) BP: (94-145)/(46-71) 124/69 mmHg (08/28 0604) SpO2:  [93 %-100 %] 96 % (08/28 0604)   Intake/Output from previous day: 08/27 0701 - 08/28 0700 In: 3067.7 [I.V.:3037.7; NG/GT:30] Out: 1550 [Urine:1080; Emesis/NG output:370; Drains:100] Intake/Output this shift:     General appearance: alert and cooperative, NAD GI: soft, non-distended, appropriately tender, wound vac in place Drain: 100cc/24 hr mostly serous UOP improving   Lab Results:   Recent Labs  09/08/12 0313 09/09/12 0423  WBC 17.7* 11.2*  HGB 8.6* 5.8*  HCT 25.9* 17.0*  PLT 194 159   BMET  Recent Labs  09/08/12 0313 09/09/12 0423  NA 132* 132*  K 5.2* 4.1  CL 102 103  CO2 22 24  GLUCOSE 135* 133*  BUN 12 14  CREATININE 1.93* 1.52*  CALCIUM 7.1* 6.5*   PT/INR No results found for this basename: LABPROT, INR,  in the last 72 hours ABG  Recent Labs  09/07/12 1356  HCO3 19.8*    MEDS, Scheduled . antiseptic oral rinse  15 mL Mouth Rinse q12n4p  . chlorhexidine  15 mL Mouth Rinse BID  . enoxaparin (LOVENOX) injection  40 mg Subcutaneous Q24H  . furosemide  20 mg Intravenous Once  . morphine   Intravenous Q4H  . pantoprazole (PROTONIX) IV  40 mg Intravenous Q12H  . sodium chloride  3 mL Intravenous Q12H    Studies/Results: No results found.  Assessment: s/p Procedure(s): EXPLORATORY LAPAROTOMY PARTIAL GASTRECTOMY/RESECTION OF ABDOMINAL MASS Patient Active Problem List   Diagnosis Date Noted  . Hypokalemia 09/06/2012  . Protein-calorie malnutrition, severe 09/05/2012  . Syncope  09/04/2012  . GI bleed 09/04/2012  . History of multiple sclerosis 09/04/2012  . HTN (hypertension) 09/04/2012  . Hyperlipidemia 09/04/2012  . Acute blood loss anemia 09/04/2012  . Submucosal lesion of stomach 09/04/2012  . Gastric ulcer 09/04/2012    Expected post op course  Plan: POD#2-Exp Lap with partial gastrectomy/resection of abd mass: cont NGT for decompression and NPO, will need swallow study prior to starting POs, not ready for this yet.  Acute blood loss Anemia: doesn't appear to be actively bleeding, a little tachycardiac but BP stable, will transfuse 2 units PRBC this am, recheck labs later today  ARF: most likely due to hypotension and acute blood loss anemia during surgery.  Improving, UOP better, will monitor with foley and labs for now.   LOS: 6 days     Princess Karnes, [facility], [place] [phone]   09/09/2012 8:01 AM

## 2012-09-10 ENCOUNTER — Inpatient Hospital Stay (HOSPITAL_COMMUNITY): Payer: Medicaid Other

## 2012-09-10 ENCOUNTER — Ambulatory Visit (HOSPITAL_COMMUNITY): Admission: RE | Admit: 2012-09-10 | Payer: Medicaid Other | Source: Ambulatory Visit

## 2012-09-10 LAB — BASIC METABOLIC PANEL
BUN: 6 mg/dL (ref 6–23)
CO2: 26 mEq/L (ref 19–32)
Chloride: 106 mEq/L (ref 96–112)
GFR calc non Af Amer: 52 mL/min — ABNORMAL LOW (ref >90)
Glucose, Bld: 119 mg/dL — ABNORMAL HIGH (ref 70–99)
Potassium: 4 mEq/L (ref 3.5–5.1)

## 2012-09-10 LAB — TYPE AND SCREEN: Unit division: 0

## 2012-09-10 LAB — GLUCOSE, CAPILLARY
Glucose-Capillary: 114 mg/dL — ABNORMAL HIGH (ref 70–99)
Glucose-Capillary: 105 mg/dL — ABNORMAL HIGH (ref 70–99)

## 2012-09-10 LAB — CBC
HCT: 24 % — ABNORMAL LOW (ref 36.0–46.0)
Hemoglobin: 8 g/dL — ABNORMAL LOW (ref 12.0–15.0)
MCHC: 33.3 g/dL (ref 30.0–36.0)

## 2012-09-10 MED ORDER — PIPERACILLIN-TAZOBACTAM 3.375 G IVPB
3.3750 g | Freq: Three times a day (TID) | INTRAVENOUS | Status: DC
  Administered 2012-09-10 – 2012-09-12 (×7): 3.375 g via INTRAVENOUS
  Filled 2012-09-10 (×8): qty 50

## 2012-09-10 NOTE — Progress Notes (Signed)
ATTENDING ADDENDUM:  I personally reviewed patient's record, examined the patient, and formulated the following assessment and plan:  ATTENDING ADDENDUM:  I personally reviewed patient's record, examined the patient, and formulated the following assessment and plan:  Doing better.  C/o sore throat.  HGb responded appropriately.  Fevers overnight with some tachycardia.  Drain output serous.  Will start Zosyn due to concern for contamination during surgery.    NG removed due to low output.  Do not reinsert.  Will get swallow study prior to starting a diet.  Cr better: ARI resolving.

## 2012-09-10 NOTE — Progress Notes (Signed)
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    Goal:   Patient will meet >/=90% of estimated nutrition needs - not met   Monitor:   Weights, labs, diet advancement  Assessment:   POD# 3 exploratory laparotomy with partial gastrectomy/resection of abdominal mass. Pt with abdominal wound VAC. Pt had NGT placed for decompression and pt is NPO with plans for swallow study prior to starting POs. Met with pt who denies any nausea. Pt with NGT brown output total yesterday. This is pt's 7th day of being NPO/clear liquids. No new weights.   Height: Ht Readings from Last 1 Encounters:  09/04/12 5\' 8"  (1.727 m)    Weight Status:   Wt Readings from Last 1 Encounters:  09/04/12 210 lb 1.6 oz (95.3 kg)    Re-estimated needs:  Kcal: 2000-2150 Protein: 100-115g Fluid: 2-2.1L/day  Skin: Mid abdomen wound VAC, abdominal incision, + 1 generalized edema  Diet Order: NPO   Intake/Output Summary (Last 24 hours) at 09/10/12 1018 Last data filed at 09/10/12 0700  Gross per 24 hour  Intake   4319 ml  Output   6255 ml  Net  -1936 ml    Last BM: 8/26   Labs:   Recent Labs Lab 09/08/12 0313 09/09/12 0423 09/10/12 0410  NA 132* 132* 135  K 5.2* 4.1 4.0  CL 102 103 106  CO2 22 24 26   BUN 12 14 6   CREATININE 1.93* 1.52* 1.24*  CALCIUM 7.1* 6.5* 7.7*  GLUCOSE 135* 133* 119*    CBG (last 3)   Recent Labs  09/09/12 1126 09/09/12 1833 09/10/12 0557  GLUCAP 92 86 114*    Scheduled Meds: . antiseptic oral rinse  15 mL Mouth Rinse q12n4p  . chlorhexidine  15 mL Mouth Rinse BID  . enoxaparin (LOVENOX)  injection  40 mg Subcutaneous Q24H  . morphine   Intravenous Q4H  . pantoprazole (PROTONIX) IV  40 mg Intravenous Q12H  . piperacillin-tazobactam (ZOSYN)  IV  3.375 g Intravenous Q8H  . sodium chloride  3 mL Intravenous Q12H    Continuous Infusions: . dextrose 5 % and 0.45 % NaCl with KCl 20 mEq/L 125 mL/hr at 09/09/12 [address] [place], [place] [phone] Pager [phone] After Hours Pager

## 2012-09-10 NOTE — Progress Notes (Signed)
Patient ID: Monica [name], female   DOB: [birth year], 46 y.o.   MRN: [id number] 3 Days Post-Op partial gastrectomy.   Subjective: Doing ok, no major complaints, pain controlled with PCA, got up to side of bed this am, denies bm,flatus, not really walking much but is getting up to bedside commode, spitting up phlegmon and having a little chest discomfort  Objective: Vital signs in last 24 hours: Temp:  [97.5 F (36.4 C)-101.6 F (38.7 C)] 100.8 F (38.2 C) (08/29 0512) Pulse Rate:  [94-104] 103 (08/29 0512) Resp:  [18-20] 18 (08/29 0512) BP: (107-138)/(58-83) 123/83 mmHg (08/29 0512) SpO2:  [95 %-100 %] 95 % (08/29 0512)   Intake/Output from previous day: 08/28 0701 - 08/29 0700 In: 4319 [P.O.:600; I.V.:3100; Blood:619] Out: 6615 [Urine:6450; Emesis/NG output:100; Drains:65] Intake/Output this shift:     General appearance: alert and cooperative, NAD GI: soft, non-distended, appropriately tender, wound vac in place Drain: 65cc/24 hr mostly serous Lungs; coarse BS on right, crackles    Lab Results:   Recent Labs  09/09/12 0423 09/09/12 1707 09/10/12 0410  WBC 11.2*  --  10.8*  HGB 5.8* 7.5* 8.0*  HCT 17.0* 22.1* 24.0*  PLT 159  --  208   BMET  Recent Labs  09/09/12 0423 09/10/12 0410  NA 132* 135  K 4.1 4.0  CL 103 106  CO2 24 26  GLUCOSE 133* 119*  BUN 14 6  CREATININE 1.52* 1.24*  CALCIUM 6.5* 7.7*   PT/INR No results found for this basename: LABPROT, INR,  in the last 72 hours ABG  Recent Labs  09/07/12 1356  HCO3 19.8*    MEDS, Scheduled . antiseptic oral rinse  15 mL Mouth Rinse q12n4p  . chlorhexidine  15 mL Mouth Rinse BID  . enoxaparin (LOVENOX) injection  40 mg Subcutaneous Q24H  . morphine   Intravenous Q4H  . pantoprazole (PROTONIX) IV  40 mg Intravenous Q12H  . piperacillin-tazobactam (ZOSYN)  IV  3.375 g Intravenous Q8H  . sodium chloride  3 mL Intravenous Q12H    Studies/Results: No results found.  Assessment: s/p  Procedure(s): EXPLORATORY LAPAROTOMY PARTIAL GASTRECTOMY/RESECTION OF ABDOMINAL MASS Patient Active Problem List   Diagnosis Date Noted  . Hypokalemia 09/06/2012  . Protein-calorie malnutrition, severe 09/05/2012  . Syncope 09/04/2012  . GI bleed 09/04/2012  . History of multiple sclerosis 09/04/2012  . HTN (hypertension) 09/04/2012  . Hyperlipidemia 09/04/2012  . Acute blood loss anemia 09/04/2012  . Submucosal lesion of stomach 09/04/2012  . Gastric ulcer 09/04/2012    Expected post op course  Plan: POD#3-Exp Lap with partial gastrectomy/resection of abd mass: cont NGT for decompression and NPO, will need swallow study prior to starting POs, not ready for this yet.  Wound vac in place-per Dr. Maisie Fus leave for 5 days before first change, needs to ambulate in hallway  Acute blood loss Anemia: appropriate response to transfusion, Hgb now 8.0  Leukocytosis: improving, on zosyn  Mild chest discomfort, productive cough: will get CXR today  ARF: most likely due to hypotension and acute blood loss anemia during surgery.  Improving, UOP better, foley out now.   LOS: 7 days     Monica [name], [facility], [place] [phone]   09/10/2012 8:23 AM

## 2012-09-11 ENCOUNTER — Inpatient Hospital Stay (HOSPITAL_COMMUNITY): Payer: Medicaid Other

## 2012-09-11 LAB — BASIC METABOLIC PANEL
CO2: 24 mEq/L (ref 19–32)
Calcium: 8 mg/dL — ABNORMAL LOW (ref 8.4–10.5)
Creatinine, Ser: 1.09 mg/dL (ref 0.50–1.10)
GFR calc non Af Amer: 60 mL/min — ABNORMAL LOW (ref >90)
Sodium: 134 mEq/L — ABNORMAL LOW (ref 135–145)

## 2012-09-11 LAB — GLUCOSE, CAPILLARY
Glucose-Capillary: 103 mg/dL — ABNORMAL HIGH (ref 70–99)
Glucose-Capillary: 105 mg/dL — ABNORMAL HIGH (ref 70–99)
Glucose-Capillary: 106 mg/dL — ABNORMAL HIGH (ref 70–99)

## 2012-09-11 LAB — CBC
MCH: 29 pg (ref 26.0–34.0)
MCHC: 33.7 g/dL (ref 30.0–36.0)
MCV: 86.3 fL (ref 78.0–100.0)
Platelets: 205 10*3/uL (ref 150–400)
RBC: 2.41 MIL/uL — ABNORMAL LOW (ref 3.87–5.11)

## 2012-09-11 MED ORDER — FERROUS SULFATE 220 (44 FE) MG/5ML PO ELIX
220.0000 mg | ORAL_SOLUTION | Freq: Three times a day (TID) | ORAL | Status: DC
  Filled 2012-09-11 (×3): qty 5

## 2012-09-11 MED ORDER — IOHEXOL 300 MG/ML  SOLN
50.0000 mL | Freq: Once | INTRAMUSCULAR | Status: AC | PRN
  Administered 2012-09-11: 50 mL via ORAL

## 2012-09-11 MED ORDER — FERROUS SULFATE 300 (60 FE) MG/5ML PO SYRP
300.0000 mg | ORAL_SOLUTION | Freq: Three times a day (TID) | ORAL | Status: DC
  Administered 2012-09-11 – 2012-09-13 (×6): 300 mg via ORAL
  Filled 2012-09-11 (×9): qty 5

## 2012-09-11 NOTE — Progress Notes (Signed)
Dr Maisie Fus paged at patient insistence.  Patient asking for crackers but currently on clear liquid diet.  Dr Maisie Fus paged orders received to increase diet to full liquid but may not have crackers.  Patient made aware.  Ensure offered. Patient states she would like to try vanilla ensure.

## 2012-09-11 NOTE — Progress Notes (Signed)
Patient ID: Monica [name], female   DOB: Jun 02, 1965, 47 y.o.   MRN: [id number] 4 Days Post-Op partial gastrectomy.   Subjective: Doing ok, no major complaints, pain controlled with PCA, got up to side of bed this am, denies bm,flatus, not really walking much but is getting up to bedside commode, spitting up phlegmon and having a little chest discomfort  Objective: Vital signs in last 24 hours: Temp:  [97.8 F (36.6 C)-99.5 F (37.5 C)] 98.1 F (36.7 C) (08/30 1000) Pulse Rate:  [73-87] 73 (08/30 1000) Resp:  [16-20] 16 (08/30 1000) BP: (131-140)/(80-85) 135/85 mmHg (08/30 1000) SpO2:  [95 %-99 %] 99 % (08/30 1000)   Intake/Output from previous day: 08/29 0701 - 08/30 0700 In: 0  Out: 3605 [Urine:3550; Drains:55] Intake/Output this shift:     General appearance: alert and cooperative, NAD GI: soft, non-distended, appropriately tender, wound vac in place Drain: mostly serous Lungs; coarse BS on right, crackles    Lab Results:   Recent Labs  09/10/12 0410 09/11/12 0405  WBC 10.8* 7.2  HGB 8.0* 7.0*  HCT 24.0* 20.8*  PLT 208 205   BMET  Recent Labs  09/10/12 0410 09/11/12 0405  NA 135 134*  K 4.0 3.8  CL 106 105  CO2 26 24  GLUCOSE 119* 110*  BUN 6 4*  CREATININE 1.24* 1.09  CALCIUM 7.7* 8.0*   PT/INR No results found for this basename: LABPROT, INR,  in the last 72 hours ABG No results found for this basename: PHART, PCO2, PO2, HCO3,  in the last 72 hours  MEDS, Scheduled . antiseptic oral rinse  15 mL Mouth Rinse q12n4p  . chlorhexidine  15 mL Mouth Rinse BID  . enoxaparin (LOVENOX) injection  40 mg Subcutaneous Q24H  . morphine   Intravenous Q4H  . pantoprazole (PROTONIX) IV  40 mg Intravenous Q12H  . piperacillin-tazobactam (ZOSYN)  IV  3.375 g Intravenous Q8H  . sodium chloride  3 mL Intravenous Q12H    Studies/Results: Dg Chest Port 1 View  09/10/2012   *RADIOLOGY REPORT*  Clinical Data: The chest pain and productive cough.  PORTABLE CHEST -  1 VIEW  Comparison: None  Findings: The NG tube is in the stomach.  The heart is upper limits of normal in size given the AP projection.  The mediastinal and hilar contours are within normal limits.  Low lung volumes with vascular crowding and bibasilar atelectasis.  Mild elevation of the right hemidiaphragm.  No edema or effusions.  IMPRESSION:  Low lung volumes with vascular crowding and bibasilar atelectasis.   Original Report Authenticated By: Rudie Meyer, M.D.    Assessment: s/p Procedure(s): EXPLORATORY LAPAROTOMY PARTIAL GASTRECTOMY/RESECTION OF ABDOMINAL MASS Patient Active Problem List   Diagnosis Date Noted  . Hypokalemia 09/06/2012  . Protein-calorie malnutrition, severe 09/05/2012  . Syncope 09/04/2012  . GI bleed 09/04/2012  . History of multiple sclerosis 09/04/2012  . HTN (hypertension) 09/04/2012  . Hyperlipidemia 09/04/2012  . Acute blood loss anemia 09/04/2012  . Submucosal lesion of stomach 09/04/2012  . Gastric ulcer 09/04/2012    Expected post op course.  Difficult surgery  Plan: POD#4-Exp Lap with partial gastrectomy/resection of abd mass: Will get swallow eval today and start clears if no leak.   Wound vac in place for high risk wound, cont 5 days then off, needs to ambulate in hallway  Acute blood loss Anemia: Hgb back down to 7 today, no signs of acute blood loss noted.  Possibly dilutional.  Will monitor and  start iron supplement once taking PO  Leukocytosis: improving, on zosyn  Mild chest discomfort, productive cough: will get CXR today  ARF:  Improving, UOP better, foley out now.   LOS: 8 days     [name] C.  [place] [facility], [place] [phone]   09/11/2012 11:19 AM

## 2012-09-12 LAB — CBC
Hemoglobin: 7.9 g/dL — ABNORMAL LOW (ref 12.0–15.0)
MCH: 28.2 pg (ref 26.0–34.0)
MCHC: 32.8 g/dL (ref 30.0–36.0)
MCV: 86.1 fL (ref 78.0–100.0)
Platelets: 295 10*3/uL (ref 150–400)
RBC: 2.8 MIL/uL — ABNORMAL LOW (ref 3.87–5.11)

## 2012-09-12 MED ORDER — DOCUSATE SODIUM 100 MG PO CAPS
100.0000 mg | ORAL_CAPSULE | Freq: Two times a day (BID) | ORAL | Status: DC
  Administered 2012-09-12: 100 mg via ORAL
  Filled 2012-09-12 (×4): qty 1

## 2012-09-12 MED ORDER — MORPHINE SULFATE 2 MG/ML IJ SOLN: 2.0000 mg | INTRAMUSCULAR | Status: DC | PRN

## 2012-09-12 MED ORDER — OXYCODONE-ACETAMINOPHEN 5-325 MG PO TABS
1.0000 | ORAL_TABLET | Freq: Four times a day (QID) | ORAL | Status: DC | PRN
  Administered 2012-09-12 – 2012-09-13 (×2): 2 via ORAL
  Filled 2012-09-12 (×2): qty 2

## 2012-09-12 NOTE — Progress Notes (Signed)
Patient ID: Monica [name], female   DOB: July 30, 1965, 47 y.o.   MRN: [id number] 5 Days Post-Op partial gastrectomy.   Subjective: Doing ok, no major complaints, pain controlled with PCA, tolerated full liquids, having flatus but no BM yet Objective: Vital signs in last 24 hours: Temp:  [97.5 F (36.4 C)-99.4 F (37.4 C)] 97.5 F (36.4 C) (08/31 0601) Pulse Rate:  [66-83] 76 (08/31 0601) Resp:  [8-20] 18 (08/31 0759) BP: (113-158)/(73-95) 126/78 mmHg (08/31 0601) SpO2:  [95 %-100 %] 98 % (08/31 0759)   Intake/Output from previous day: 08/30 0701 - 08/31 0700 In: 5659.2 [P.O.:120; I.V.:5189.2; IV Piggyback:350] Out: 3055 [Urine:3000; Drains:55] Intake/Output this shift: Total I/O In: -  Out: 404 [Urine:400; Drains:4]   General appearance: alert and cooperative, NAD GI: soft, non-distended, appropriately tender, wound vac in place Drain: mostly serous, non-bilious      Lab Results:   Recent Labs  09/11/12 0405 09/12/12 0450  WBC 7.2 5.8  HGB 7.0* 7.9*  HCT 20.8* 24.1*  PLT 205 295   BMET  Recent Labs  09/10/12 0410 09/11/12 0405  NA 135 134*  K 4.0 3.8  CL 106 105  CO2 26 24  GLUCOSE 119* 110*  BUN 6 4*  CREATININE 1.24* 1.09  CALCIUM 7.7* 8.0*   PT/INR  Recent Labs  09/12/12 0450  LABPROT 13.8  INR 1.08   ABG No results found for this basename: PHART, PCO2, PO2, HCO3,  in the last 72 hours  MEDS, Scheduled . enoxaparin (LOVENOX) injection  40 mg Subcutaneous Q24H  . ferrous sulfate  300 mg Oral TID WC  . pantoprazole (PROTONIX) IV  40 mg Intravenous Q12H  . sodium chloride  3 mL Intravenous Q12H    Studies/Results: Dg Chest Port 1 View  09/10/2012   *RADIOLOGY REPORT*  Clinical Data: The chest pain and productive cough.  PORTABLE CHEST - 1 VIEW  Comparison: None  Findings: The NG tube is in the stomach.  The heart is upper limits of normal in size given the AP projection.  The mediastinal and hilar contours are within normal limits.  Low lung  volumes with vascular crowding and bibasilar atelectasis.  Mild elevation of the right hemidiaphragm.  No edema or effusions.  IMPRESSION:  Low lung volumes with vascular crowding and bibasilar atelectasis.   Original Report Authenticated By: Rudie Meyer, M.D.   Varney Biles Kayleen Memos [place]  09/11/2012   *RADIOLOGY REPORT*  Clinical Data:Gastric tumor resection  09/07/2012.  Rule out leak.  UPPER GI SERIES W/ KUB  Technique:  After obtaining a scout radiograph a single-column upper GI series was performed using Omnipaque.  Fluoroscopy Time: 2-minute-3-second  Comparison: CT 09/04/2012  Findings: Preliminary image reveals a surgical drain over the epigastric area with the tip extending over the stomach.  The patient swallowed Omnipaque in the upright position.  No esophageal obstruction or stricture.  The stomach fills with contrast.  There is an area of circumferential narrowing of the lower gastric body.  There is mild irregularity in this area.  This is most likely related to recent tumor resection and edema. Residual tumor cannot be excluded.  The antrum is smooth and normal in caliber.  There is emptying of the stomach into the duodenum which is normal.  Negative for leak.  IMPRESSION: Negative for leak.  Residual narrowing in the gastric body at the tumor resection site. This may be due to postop edema versus residual tumor.   Original Report Authenticated By: Janeece Riggers, M.D.  Assessment: s/p Procedure(s): EXPLORATORY LAPAROTOMY PARTIAL GASTRECTOMY/RESECTION OF ABDOMINAL MASS Patient Active Problem List   Diagnosis Date Noted  . Hypokalemia 09/06/2012  . Protein-calorie malnutrition, severe 09/05/2012  . Syncope 09/04/2012  . GI bleed 09/04/2012  . History of multiple sclerosis 09/04/2012  . HTN (hypertension) 09/04/2012  . Hyperlipidemia 09/04/2012  . Acute blood loss anemia 09/04/2012  . Submucosal lesion of stomach 09/04/2012  . Gastric ulcer 09/04/2012    Expected post op course.  Difficult  surgery  Plan: POD#5-Exp Lap with partial gastrectomy/resection of abd mass: Swallow eval without evidence of leak.  Tolerating liquids.  Will advance to soft diet. Wound vac in place for high risk wound, cont 5 days then off, needs to ambulate in hallway  Acute blood loss Anemia: Hgb back up to 7.9 today, no signs of acute blood loss noted. Will monitor and cont iron supplement   Leukocytosis: improving, no signs of infection- will stop abx  ARF:  Improved, UOP good, foley out now.   LOS: 9 days     Shreeya Recendiz C.  [place] [facility], [place] [phone]   09/12/2012 8:15 AM

## 2012-09-13 LAB — CBC
HCT: 23 % — ABNORMAL LOW (ref 36.0–46.0)
Hemoglobin: 7.6 g/dL — ABNORMAL LOW (ref 12.0–15.0)
MCV: 85.2 fL (ref 78.0–100.0)
WBC: 4.7 10*3/uL (ref 4.0–10.5)

## 2012-09-13 MED ORDER — FERROUS SULFATE 325 (65 FE) MG PO TABS: 325.0000 mg | ORAL_TABLET | Freq: Every day | ORAL | Status: DC

## 2012-09-13 MED ORDER — DSS 100 MG PO CAPS: 100.0000 mg | ORAL_CAPSULE | Freq: Two times a day (BID) | ORAL | Status: DC

## 2012-09-13 MED ORDER — OXYCODONE-ACETAMINOPHEN 5-325 MG PO TABS: 1.0000 | ORAL_TABLET | Freq: Four times a day (QID) | ORAL | Status: DC | PRN

## 2012-09-13 NOTE — Discharge Summary (Signed)
Physician Discharge Summary  Patient ID: Monica [name] MRN: [id number] DOB/AGE: 46-Nov-1967 46 y.o.  Admit date: 09/03/2012 Discharge date: 09/13/2012  Admission Diagnoses: anemia, syncope  Discharge Diagnoses:  Principal Problem:   Syncope Active Problems:   GI bleed   History of multiple sclerosis   HTN (hypertension)   Hyperlipidemia   Acute blood loss anemia   Submucosal lesion of stomach   Gastric ulcer   Protein-calorie malnutrition, severe   Hypokalemia Gastric mass  Discharged Condition: good  Hospital Course: The patient was admitted after an episode of syncope and GI bleeding.  EGD revealed a large submucosal mass with an area of friability on the lesser curve.  A CT was performed and showed a 15cm mass on the lesser curve of the stomach.  She was subsequently taken to the OR for resection.  Upon evaluation of the abdomen, the mass appeared to have ruptured and bled into her LUQ.  The mass was removed with non-intact capsule.  There was significant blood loss during surgery due to the vascularity and friability of the tumor.  The lesser curve of the stomach was necrotic as well, presumably from pressure.  The L gastric artery and vein were ligated to remove the mass.  The stomach was closed with staples.  She was given 2 units pRBC's intra-operatively.  Her Cr increased to 2 on POD 1.  This resolved with hydration.  Her Hgb dropped to 5.8 with tachycardia noted on POD 2.  She was given an additional 2u pRBC's at that time.  She was left NPO with NGT intact until gastric function returned.  A swallow study confirmed no leak.  Her diet was advanced without difficulty.  She began to have BM's.  She was determined to be in stable condition for d/c home on POD 6.    Consults: None  Significant Diagnostic Studies: labs: CBC, chemistries, CT scan, EGD  Treatments: IV hydration, antibiotics: Zosyn, analgesia: acetaminophen w/ codeine, surgery: open partial gastrectomy and blood  transfusion  Discharge Exam: Blood pressure 143/81, pulse 80, temperature 98 F (36.7 C), temperature source Oral, resp. rate 18, height 5\' 8"  (1.727 m), weight 210 lb 1.6 oz (95.3 kg), last menstrual period 09/10/2012, SpO2 98.00%. General appearance: alert and cooperative GI: soft, non-tender; bowel sounds normal; no masses,  no organomegaly Incision/Wound: clean, dry, intact  Disposition: Home    Future Appointments Provider Department Dept Phone   12/30/2012 11:00 AM York Spaniel, MD [facility] [phone]       Medication List         DSS 100 MG Caps  Take 100 mg by mouth 2 (two) times daily.     ferrous sulfate 325 (65 FE) MG tablet  Commonly known as:  FERROUSUL  Take 1 tablet (325 mg total) by mouth daily with breakfast.     glatiramer 20 MG/ML injection  Commonly known as:  COPAXONE  Inject 1 mL (20 mg total) into the skin daily.     lisinopril 10 MG tablet  Commonly known as:  PRINIVIL,ZESTRIL  Take 10 mg by mouth daily.     oxyCODONE-acetaminophen 5-325 MG per tablet  Commonly known as:  PERCOCET/ROXICET  Take 1-2 tablets by mouth every 6 (six) hours as needed.     simvastatin 10 MG tablet  Commonly known as:  ZOCOR  Take 10 mg by mouth at bedtime.           Follow-up Information   Follow up with Vanita Panda., MD  In 2 weeks.   Specialty:  General Surgery   Contact information:   [address] [place] [place] [address] [phone]       Signed: Vanita Panda 09/13/2012, 8:58 AM

## 2012-09-14 ENCOUNTER — Telehealth (INDEPENDENT_AMBULATORY_CARE_PROVIDER_SITE_OTHER): Payer: Self-pay

## 2012-09-14 NOTE — Telephone Encounter (Signed)
I called the pt and scheduled her to see Dr Maisie Fus in 2 weeks on 9/15

## 2012-09-17 ENCOUNTER — Ambulatory Visit (INDEPENDENT_AMBULATORY_CARE_PROVIDER_SITE_OTHER): Payer: Medicaid Other

## 2012-09-17 VITALS — BP 130/92 | HR 76 | Temp 98.5°F | Resp 18

## 2012-09-17 DIAGNOSIS — Z4802 Encounter for removal of sutures: Secondary | ICD-10-CM

## 2012-09-17 NOTE — Progress Notes (Signed)
Pt in office for staple removal. Wd clean,dry, cool to touch. No redness. No swelling. No fever. Staples removed. Benzoin and steri strips applied. Pt states eating well. Normal BMs. Pt to keep po appt with Dr Maisie Fus and call with any questions or concerns.

## 2012-09-23 ENCOUNTER — Telehealth (INDEPENDENT_AMBULATORY_CARE_PROVIDER_SITE_OTHER): Payer: Self-pay

## 2012-09-23 ENCOUNTER — Telehealth (INDEPENDENT_AMBULATORY_CARE_PROVIDER_SITE_OTHER): Payer: Self-pay | Admitting: General Surgery

## 2012-09-23 ENCOUNTER — Encounter (HOSPITAL_COMMUNITY): Payer: Self-pay | Admitting: *Deleted

## 2012-09-23 ENCOUNTER — Emergency Department (HOSPITAL_COMMUNITY)
Admission: EM | Admit: 2012-09-23 | Discharge: 2012-09-23 | Discharge status: Against Medical Advice | Payer: Medicaid Other | Attending: Emergency Medicine | Admitting: Emergency Medicine

## 2012-09-23 DIAGNOSIS — T8140XA Infection following a procedure, unspecified, initial encounter: Secondary | ICD-10-CM | POA: Insufficient documentation

## 2012-09-23 DIAGNOSIS — Z87891 Personal history of nicotine dependence: Secondary | ICD-10-CM | POA: Insufficient documentation

## 2012-09-23 DIAGNOSIS — Y838 Other surgical procedures as the cause of abnormal reaction of the patient, or of later complication, without mention of misadventure at the time of the procedure: Secondary | ICD-10-CM | POA: Insufficient documentation

## 2012-09-23 DIAGNOSIS — R197 Diarrhea, unspecified: Secondary | ICD-10-CM | POA: Insufficient documentation

## 2012-09-23 DIAGNOSIS — Z9889 Other specified postprocedural states: Secondary | ICD-10-CM | POA: Insufficient documentation

## 2012-09-23 DIAGNOSIS — I10 Essential (primary) hypertension: Secondary | ICD-10-CM | POA: Insufficient documentation

## 2012-09-23 NOTE — ED Notes (Signed)
Pt states recently had abdominal surgery to remove a mass, states the past week has had puss come out of incision site and diarrhea, called her doctor and was told to come here d/t possible infection.

## 2012-09-23 NOTE — Telephone Encounter (Signed)
Pt called stating there is pus coming from incision. No fever. Wd slight redness. Eating well. Is having some loose stools. Urinating well.  Pt advised she needs to be seen in office today to have wd evaluated. Pt refuses appt today. I stressed importance of having wd looked at to r/o infection and need for treatment. Pt declined and states she will call back tomorrow if she decides to come in.

## 2012-09-23 NOTE — Telephone Encounter (Signed)
Patient called and stated that she went to the hospital, she did feel any better

## 2012-09-27 ENCOUNTER — Encounter (INDEPENDENT_AMBULATORY_CARE_PROVIDER_SITE_OTHER): Payer: Medicaid Other | Admitting: General Surgery

## 2012-09-28 ENCOUNTER — Ambulatory Visit (INDEPENDENT_AMBULATORY_CARE_PROVIDER_SITE_OTHER): Payer: Medicaid Other | Admitting: General Surgery

## 2012-09-28 ENCOUNTER — Encounter (INDEPENDENT_AMBULATORY_CARE_PROVIDER_SITE_OTHER): Payer: Self-pay | Admitting: General Surgery

## 2012-09-28 VITALS — BP 124/72 | HR 68 | Resp 14 | Ht 68.0 in | Wt 200.4 lb

## 2012-09-28 DIAGNOSIS — C494 Malignant neoplasm of connective and soft tissue of abdomen: Secondary | ICD-10-CM

## 2012-09-28 DIAGNOSIS — C49A0 Gastrointestinal stromal tumor, unspecified site: Secondary | ICD-10-CM | POA: Insufficient documentation

## 2012-09-28 NOTE — Patient Instructions (Signed)
We will schedule an apt for you to see an oncologist for further treatment of you cancer

## 2012-09-28 NOTE — Progress Notes (Signed)
Monica [name] is a 47 y.o. female who is status post a partial gastrectomy and removal of a perforated GIST on 8/27.  She is doing well.  She is eating well without nausea.  She has not had any significant weight loss.  She denies melena, diarrhea or constipation.   Objective: Filed Vitals:   09/28/12 1200  BP: 124/72  Pulse: 68  Resp: 14    General appearance: alert and cooperative GI: normal findings: soft, non-tender  Incision: healing well   Assessment: s/p  Patient Active Problem List   Diagnosis Date Noted  . 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 Hypokalemia 09/06/2012  . Protein-calorie malnutrition, severe 09/05/2012  . Syncope 09/04/2012  . GI bleed 09/04/2012  . History of multiple sclerosis 09/04/2012  . HTN (hypertension) 09/04/2012  . Hyperlipidemia 09/04/2012  . Acute blood loss anemia 09/04/2012  . Submucosal lesion of stomach 09/04/2012   Pathological Diagnosis 1. 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 - PLEASE SEE ONCOLOGY TEMPLATE FOR DETAILS. 2. Stomach, resection, gastric wall margin - FOCALLY POSITIVE FOR GASTROINTESTINAL STROMAL TUMOR OVER THE SEROSA. Microscopic Comment 1. GASTROINTESTINAL STROMAL TUMOR (GIST) Tumor size: 25 x 14 x7 cm in aggregates GIST subtype: Mixed spindle and epithelial cell type Mitotic rate: 33 mitoses/ 50 HPFs Grade: High grade Risk assessment: 86% observed rate of progressive disease, prognostic group 6b, stage IIIB Margins: Focally positive in part B Lymph- vascular invasion: Not identified Lymph nodes: # examined N/A; # positive N/A TNM: pT4, pNX Ancillary studies: Will be performed upon request Preresection treatment: No Treatment effect: No Comment: Sections show spindle cell and epithelial cell proliferation with myxoid differentiation. On high power view, the tumor cells display  significant cytologic atypia, increased mitotic activity (33/50 HPFs) and associated focal tumor necrosis. The tumor cells are strongly positive for CD117 and CD34, negative for S-100, SMA and desmin. Based on the prognostic factors, including tumor size( more than 10 cm), high mitotic rate and associated tumor necrosis, the overall disease progression rate is estimated at 86%.  Clinical correlation is highly recommended. In addition, the tumor is focally present at the additional gastric wall margin in part 2. (HCL:kh 09-14-12)  Plan: Monica [name] is a 47 y.o. F who is s/p open partial gastrectomy for removal of a large GIST.  Her pathology shows invasion of cancer within the gastric wall and a focally positive margin.  Her progression rate is estimated at 86%.  These results were discussed with her in detail. I think she would be a good candidate for Gleevec and then close follow up.  I will have her see Dr Truett Perna for further evaluation.  I will see her back in 3 months.      Vanita Panda, MD [facility], [place] [phone]   09/28/2012 12:50 PM

## 2012-09-29 ENCOUNTER — Telehealth: Payer: Self-pay | Admitting: Oncology

## 2012-09-29 NOTE — Telephone Encounter (Signed)
Pt scheduled per Gina.  °

## 2012-09-29 NOTE — Telephone Encounter (Signed)
Spoke with patient by phone and confirmed appointment with Dr. Truett Perna for 10/05/12.  Contact names and phone numbers were provided.

## 2012-09-29 NOTE — Telephone Encounter (Signed)
lvom for pt and gve np appt 09/23 @ 1:30 w/Dr. Truett Perna ask pt to return call to confirm message was received.  Welcome packet mailed

## 2012-10-05 ENCOUNTER — Ambulatory Visit: Payer: Medicaid Other

## 2012-10-05 ENCOUNTER — Ambulatory Visit: Payer: Medicaid Other | Admitting: Oncology

## 2012-10-11 ENCOUNTER — Other Ambulatory Visit: Payer: Self-pay | Admitting: *Deleted

## 2012-10-11 ENCOUNTER — Encounter: Payer: Self-pay | Admitting: Oncology

## 2012-10-11 ENCOUNTER — Ambulatory Visit (HOSPITAL_BASED_OUTPATIENT_CLINIC_OR_DEPARTMENT_OTHER): Payer: Medicaid Other | Admitting: Oncology

## 2012-10-11 ENCOUNTER — Telehealth: Payer: Self-pay | Admitting: Oncology

## 2012-10-11 ENCOUNTER — Ambulatory Visit: Payer: Medicaid Other

## 2012-10-11 VITALS — BP 126/79 | HR 61 | Temp 97.3°F | Resp 20 | Ht 68.0 in | Wt 204.8 lb

## 2012-10-11 DIAGNOSIS — G35 Multiple sclerosis: Secondary | ICD-10-CM

## 2012-10-11 DIAGNOSIS — C49A0 Gastrointestinal stromal tumor, unspecified site: Secondary | ICD-10-CM

## 2012-10-11 DIAGNOSIS — D500 Iron deficiency anemia secondary to blood loss (chronic): Secondary | ICD-10-CM

## 2012-10-11 DIAGNOSIS — C494 Malignant neoplasm of connective and soft tissue of abdomen: Secondary | ICD-10-CM

## 2012-10-11 MED ORDER — IMATINIB MESYLATE 400 MG PO TABS: 400.0000 mg | ORAL_TABLET | Freq: Every day | ORAL | Status: DC

## 2012-10-11 NOTE — Telephone Encounter (Signed)
gv and printed appt sched and avs for pt for OCT. °

## 2012-10-11 NOTE — Progress Notes (Signed)
Faxed gleevec prescription to [facility]. 

## 2012-10-11 NOTE — Telephone Encounter (Signed)
Gleeved Rx given to Monica [name] in managed care.

## 2012-10-11 NOTE — Progress Notes (Signed)
[facility] New Patient Consult   Referring MD: Dora Simeone 47 y.o.  1965-03-21    Reason for Referral: Gastrointestinal stromal tumor of the stomach     HPI: Monica [name] reports a one-month history of intermittent nausea and vomiting. Monica [name] was admitted on 09/04/2012 following a syncope event. Monica [name] had noted "black "stool. Monica [name] was noted to have a Hemoccult positive stool and severe anemia.  Monica [name] was admitted and gastroenterology was consult. An upper endoscopy on 09/04/2012 revealed a large submucosal mass in the body of the stomach. The mass was ulcerated with oozing of blood with contact.  A CT of the abdomen and pelvis on Jun 04 2012 revealed a large mass in the anterior and inferior stomach. The mass caused compression of the body and tail of the pancreas. The mass was also noted to compress and displace the liver. No enlarged lymph nodes. No lung nodules small amount free fluid in the pelvis. Normal appearing adrenal glands, liver, spleen, and ovaries.  Dr. Maisie Fus was consult at and Monica [name] was taken the operating room on 09/07/2012 for an exploratory laparotomy and partial gastrectomy. No sign of metastatic disease at the time of surgery. Friable necrotic tissue within the tumor capsule with partial rupture and in intra-abdominal hematoma. The omentum was adherent to the area of perforation. All hematoma and visible tumor were removed.  The pathology [id number]) revealed a gastrointestinal stromal tumor measuring 25 cm involving the lesser curvature of the stomach. The tumor was high-grade with 33 mitoses per 50 high-powered fields. Lymphovascular invasion was not identified. The tumor cells were positive for CD117 and CD34. Tumor was focally present at the gastric wall margin.  The nausea and vomiting have resolved.    Past Medical History  Diagnosis Date  . Hypertension   . Hyperlipidemia   . MS (multiple sclerosis)-presenting with balance problems, left  eye vision loss, and right hand spasms   2008   .    Monica [name] has one child, multiple pregnancies, abortions, Monica [name] continues to have a menstrual cycle  Past Surgical History  Procedure Laterality Date  . Esophagogastroduodenoscopy N/A 09/04/2012    Procedure: ESOPHAGOGASTRODUODENOSCOPY (EGD);  Surgeon: Beverley Fiedler, MD;  Location: [facility];  Service: Gastroenterology;  Laterality: N/A;  . Laparotomy N/A 09/07/2012    Procedure: EXPLORATORY LAPAROTOMY;  Surgeon: Romie Levee, MD;  Location: [facility];  Service: General;  Laterality: N/A;  . Partial gastrectomy N/A 09/07/2012    Procedure: PARTIAL GASTRECTOMY/RESECTION OF ABDOMINAL MASS;  Surgeon: Romie Levee, MD;  Location: [facility];  Service: General;  Laterality: N/A;    Family history: An oncologic colon cancer. Monica [name] has 3 brothers. No other family history of cancer.  Current outpatient prescriptions:ferrous sulfate (FERROUSUL) 325 (65 FE) MG tablet, Take 1 tablet (325 mg total) by mouth daily with breakfast., Disp: 30 tablet, Rfl: 0;  glatiramer (COPAXONE) 20 MG/ML injection, Inject 1 mL (20 mg total) into the skin daily., Disp: 3 kit, Rfl: 1;  lisinopril (PRINIVIL,ZESTRIL) 10 MG tablet, Take 10 mg by mouth daily., Disp: , Rfl:  imatinib (GLEEVEC) 400 MG tablet, Take 1 tablet (400 mg total) by mouth daily. Take with meals and large glass of water.Caution:Chemotherapy., Disp: 30 tablet, Rfl: 0  Allergies: No Known Allergies  Social History: Monica [name] lives with her son in [place]. Monica [name] is disabled secondary to multiple sclerosis. Monica [name] does not use cigarettes or alcohol. No transfusion history. No risk factor for HIV or hepatitis.  ROS:   Positives include:  15 pound weight loss, nausea/vomiting prior to surgery, intermittent nonproductive cough, constipation, black stools prior to surgery  A complete ROS was otherwise negative.  Physical Exam:  Blood pressure 126/79, pulse 61, temperature 97.3 F (36.3 C), temperature source Oral, resp. rate 20,  height 5\' 8"  (1.727 m), weight 204 lb 12.8 oz (92.897 kg).  HEENT: Oropharynx without visible mass, neck without mass Lungs: Clear bilaterally Cardiac: Regular rate and rhythm Abdomen: No hepatomegaly, no apparent ascites, no mass, nontender, no stomach  Vascular: No leg edema Lymph nodes: No cervical, supra-clavicular, etc., or inguinal nodes Neurologic: Alert and oriented, the motor exam appears intact in the upper and lower extremities. The extraocular movements are intact. Skin: No rash, healed upper midline abdominal incision Musculoskeletal: No spine tenderness   LAB:  CBC  Lab Results  Component Value Date   WBC 4.7 09/13/2012   HGB 7.6* 09/13/2012   HCT 23.0* 09/13/2012   MCV 85.2 09/13/2012   PLT 310 09/13/2012     CMP      Component Value Date/Time   NA 134* 09/11/2012 0405   K 3.8 09/11/2012 0405   CL 105 09/11/2012 0405   CO2 24 09/11/2012 0405   GLUCOSE 110* 09/11/2012 0405   BUN 4* 09/11/2012 0405   CREATININE 1.09 09/11/2012 0405   CALCIUM 8.0* 09/11/2012 0405   PROT 5.6* 09/04/2012 0659   ALBUMIN 3.0* 09/04/2012 0659   AST 9 09/04/2012 0659   ALT 6 09/04/2012 0659   ALKPHOS 35* 09/04/2012 0659   BILITOT 0.3 09/04/2012 0659   GFRNONAA 60* 09/11/2012 0405   GFRAA 70* 09/11/2012 0405     Radiology: As per history of present illness    Assessment/Plan:   1. Gastrointestinal stromal tumor of the stomach, high-grade, stage IIIB (T4 NX), ruptured tumor, status post a partial gastrectomy 09/07/2012  2. Multiple sclerosis-followed by Dr. Anne Hahn  3. Anemia secondary to blood loss from the gastric GIST  Disposition:   Monica [name] has been diagnosed with a high-grade gastrointestinal stromal tumor of the stomach. There is a high risk of developing recurrent disease over the next several years based on the tumor size, mitotic rate, perforation, and positive surgical margin. I recommend adjuvant Gleevec. We discussed the rationale behind the use of Gleevec in this setting. I  recommend 3 years of Gleevec.  We reviewed the potential toxicities associated with Gleevec including the chance for nausea, alopecia, rash, diarrhea, edema, cardiac toxicity, and hematologic toxicity with the potential for infection and bleeding. Monica [name] will attend a chemotherapy teaching class. Monica [name] was given reading materials on Gleevec.  The plan is to begin Gleevec on 10/18/2012. Monica [name] will return for an office and lab visit on 11/01/2012. We will check a baseline CBC when Monica [name] is here for a chemotherapy teaching class later this week.  I will contact Dr. Anne Hahn with the treatment plan. I am not aware of an interaction between [facility] and glatiramer.  Her case was presented at the GI tumor conference in September of 2014. The consensus recommendation was to proceed with adjuvant Gleevec therapy.  Approximately 45 minutes were spent with the patient today. The majority of the time was used for counseling and coordination of care.  Kimaria Struthers 10/11/2012, 2:02 PM

## 2012-10-11 NOTE — Progress Notes (Signed)
Checked in new patient with no financial issues. Just mail and phone for communication.

## 2012-10-11 NOTE — Progress Notes (Signed)
Met with Monica [name]. Explained role of nurse navigator. Educational information provided on GIST cancer and on Gleevec.  Referral made to dietician for diet education. [facility] resource sheet provided to patient, including SW service information.  Contact names and phone numbers were provided.  No barriers to care identified.  Will continue to follow as needed.

## 2012-10-12 ENCOUNTER — Telehealth: Payer: Self-pay | Admitting: *Deleted

## 2012-10-12 NOTE — Telephone Encounter (Signed)
Call from pt reporting she rescheduled chemo class and lab appt to 10/6. She is unable to attend the afternoon class and AM class was not available this week. She reports [facility] has contacted her. She will not begin Gleevec until she has had chemo class and labs in this office.

## 2012-10-15 ENCOUNTER — Telehealth: Payer: Self-pay | Admitting: *Deleted

## 2012-10-15 NOTE — Telephone Encounter (Signed)
Requests MD dictate letter regarding her medical condition. Could help her in regards to her son and a group home situation. Fax letter to a [employer] at fax [phone].

## 2012-10-18 ENCOUNTER — Other Ambulatory Visit (HOSPITAL_BASED_OUTPATIENT_CLINIC_OR_DEPARTMENT_OTHER): Payer: Medicaid Other | Admitting: Lab

## 2012-10-18 ENCOUNTER — Encounter: Payer: Self-pay | Admitting: Nutrition

## 2012-10-18 ENCOUNTER — Other Ambulatory Visit: Payer: Medicaid Other

## 2012-10-18 ENCOUNTER — Encounter: Payer: Self-pay | Admitting: Oncology

## 2012-10-18 DIAGNOSIS — C494 Malignant neoplasm of connective and soft tissue of abdomen: Secondary | ICD-10-CM

## 2012-10-18 DIAGNOSIS — C49A0 Gastrointestinal stromal tumor, unspecified site: Secondary | ICD-10-CM

## 2012-10-18 LAB — COMPREHENSIVE METABOLIC PANEL (CC13)
ALT: 6 U/L (ref 0–55)
Albumin: 3.5 g/dL (ref 3.5–5.0)
CO2: 25 mEq/L (ref 22–29)
Potassium: 3.8 mEq/L (ref 3.5–5.1)
Sodium: 139 mEq/L (ref 136–145)
Total Bilirubin: 0.28 mg/dL (ref 0.20–1.20)
Total Protein: 6.8 g/dL (ref 6.4–8.3)

## 2012-10-18 LAB — CBC WITH DIFFERENTIAL/PLATELET
BASO%: 0.7 % (ref 0.0–2.0)
Eosinophils Absolute: 0.1 10*3/uL (ref 0.0–0.5)
LYMPH%: 25.1 % (ref 14.0–49.7)
MCHC: 32.6 g/dL (ref 31.5–36.0)
MONO#: 0.4 10*3/uL (ref 0.1–0.9)
NEUT#: 3.2 10*3/uL (ref 1.5–6.5)
RBC: 3.37 10*6/uL — ABNORMAL LOW (ref 3.70–5.45)
RDW: 15.4 % — ABNORMAL HIGH (ref 11.2–14.5)
WBC: 5.1 10*3/uL (ref 3.9–10.3)
lymph#: 1.3 10*3/uL (ref 0.9–3.3)

## 2012-10-18 NOTE — Progress Notes (Signed)
Patient cancelled nutrition appointment and stated she would reschedule when she was ready to come.

## 2012-10-19 ENCOUNTER — Telehealth: Payer: Self-pay | Admitting: *Deleted

## 2012-10-19 NOTE — Telephone Encounter (Signed)
Called pt to make her aware letter is ready. Faxed to [employer] at [phone] per pt request.

## 2012-10-20 ENCOUNTER — Encounter: Payer: Medicaid Other | Admitting: Nutrition

## 2012-10-21 ENCOUNTER — Ambulatory Visit (HOSPITAL_COMMUNITY): Payer: Medicaid Other

## 2012-10-25 ENCOUNTER — Telehealth: Payer: Self-pay | Admitting: Neurology

## 2012-11-01 ENCOUNTER — Telehealth: Payer: Self-pay | Admitting: Oncology

## 2012-11-01 ENCOUNTER — Other Ambulatory Visit (HOSPITAL_BASED_OUTPATIENT_CLINIC_OR_DEPARTMENT_OTHER): Payer: Medicaid Other | Admitting: Lab

## 2012-11-01 ENCOUNTER — Ambulatory Visit (HOSPITAL_BASED_OUTPATIENT_CLINIC_OR_DEPARTMENT_OTHER): Payer: Medicaid Other | Admitting: Nurse Practitioner

## 2012-11-01 VITALS — BP 138/74 | HR 69 | Temp 98.4°F | Resp 20 | Ht 68.0 in | Wt 211.0 lb

## 2012-11-01 DIAGNOSIS — D500 Iron deficiency anemia secondary to blood loss (chronic): Secondary | ICD-10-CM

## 2012-11-01 DIAGNOSIS — C494 Malignant neoplasm of connective and soft tissue of abdomen: Secondary | ICD-10-CM

## 2012-11-01 DIAGNOSIS — C49A0 Gastrointestinal stromal tumor, unspecified site: Secondary | ICD-10-CM

## 2012-11-01 DIAGNOSIS — G35 Multiple sclerosis: Secondary | ICD-10-CM

## 2012-11-01 LAB — CBC WITH DIFFERENTIAL/PLATELET
BASO%: 1 % (ref 0.0–2.0)
LYMPH%: 27.3 % (ref 14.0–49.7)
MCHC: 32.7 g/dL (ref 31.5–36.0)
MCV: 86.8 fL (ref 79.5–101.0)
MONO#: 0.2 10*3/uL (ref 0.1–0.9)
MONO%: 7.8 % (ref 0.0–14.0)
Platelets: 264 10*3/uL (ref 145–400)
RBC: 3.46 10*6/uL — ABNORMAL LOW (ref 3.70–5.45)
RDW: 15.3 % — ABNORMAL HIGH (ref 11.2–14.5)
WBC: 3.1 10*3/uL — ABNORMAL LOW (ref 3.9–10.3)

## 2012-11-01 LAB — COMPREHENSIVE METABOLIC PANEL (CC13)
ALT: 8 U/L (ref 0–55)
Alkaline Phosphatase: 52 U/L (ref 40–150)
Sodium: 141 mEq/L (ref 136–145)
Total Bilirubin: 0.47 mg/dL (ref 0.20–1.20)
Total Protein: 6.7 g/dL (ref 6.4–8.3)

## 2012-11-01 NOTE — Telephone Encounter (Signed)
s.w. pt and advised on 11.17.14 appt....pt ok and aware

## 2012-11-01 NOTE — Progress Notes (Signed)
OFFICE PROGRESS NOTE  Interval history:  Monica [name] is a 47 year old woman diagnosed with a high-grade gastrointestinal stromal tumor of the stomach. She underwent a partial gastrectomy on 09/07/2012. Dr. Truett Perna prescribed adjuvant Gleevec. She is seen today for followup since beginning Gleevec.  She has had a few episodes of nausea. No vomiting. She denies diarrhea. No skin rash. She denies leg swelling. She denies periorbital edema. No shortness of breath or cough.   Objective: Blood pressure 138/74, pulse 69, temperature 98.4 F (36.9 C), temperature source Oral, resp. rate 20, height 5\' 8"  (1.727 m), weight 211 lb (95.709 kg).  No periorbital edema. Oropharynx is without thrush or ulceration. No palpable cervical or supraclavicular lymph nodes. Lungs are clear. No wheezes or rales. Regular cardiac rhythm. Abdomen is soft and nontender. No hepatomegaly. Well-healed midline incision. Extremities are without edema. Fine, faint rash scattered over the upper chest, back and upper arms.    Lab Results: Lab Results  Component Value Date   WBC 3.1* 11/01/2012   HGB 9.8* 11/01/2012   HCT 30.1* 11/01/2012   MCV 86.8 11/01/2012   PLT 264 11/01/2012    Chemistry:    Chemistry      Component Value Date/Time   NA 141 11/01/2012 1049   NA 134* 09/11/2012 0405   K 3.6 11/01/2012 1049   K 3.8 09/11/2012 0405   CL 105 09/11/2012 0405   CO2 23 11/01/2012 1049   CO2 24 09/11/2012 0405   BUN 12.5 11/01/2012 1049   BUN 4* 09/11/2012 0405   CREATININE 1.1 11/01/2012 1049   CREATININE 1.09 09/11/2012 0405      Component Value Date/Time   CALCIUM 8.5 11/01/2012 1049   CALCIUM 8.0* 09/11/2012 0405   ALKPHOS 52 11/01/2012 1049   ALKPHOS 35* 09/04/2012 0659   AST 12 11/01/2012 1049   AST 9 09/04/2012 0659   ALT 8 11/01/2012 1049   ALT 6 09/04/2012 0659   BILITOT 0.47 11/01/2012 1049   BILITOT 0.3 09/04/2012 0659       Studies/Results: No results found.  Medications: I have reviewed the  patient's current medications.  Assessment/Plan: 1. Gastrointestinal stromal tumor of the stomach, high grade, stage IIIB (T4 NX), ruptured tumor, status post partial gastrectomy 09/07/2012. She began adjuvant Gleevec 10/19/2012 with a three-year course planned. 2. Multiple sclerosis followed by Dr. Anne Hahn. 3. Anemia secondary to blood loss from #1.  Disposition-she appears to be tolerating the Gleevec well. She will return for labs and a followup visit in one month. She will contact the office in the interim with any problems.  Plan reviewed with Dr. Truett Perna.   Monica [name] ANP/GNP-BC

## 2012-11-03 MED ORDER — GLATIRAMER ACETATE 20 MG/ML ~~LOC~~ KIT: 20.0000 mg | PACK | Freq: Every day | SUBCUTANEOUS | Status: DC

## 2012-11-03 NOTE — Telephone Encounter (Signed)
I do not see that we have received a request for refill.  I was, however, out of the office when this message was originally taken on 10/25/2012.  Refills have now been sent.  I called the patient back.  She is aware Rx was sent.  Said she did not need anything further at this time.

## 2012-11-15 ENCOUNTER — Other Ambulatory Visit: Payer: Self-pay | Admitting: Oncology

## 2012-11-16 ENCOUNTER — Other Ambulatory Visit: Payer: Self-pay | Admitting: Oncology

## 2012-11-16 ENCOUNTER — Other Ambulatory Visit: Payer: Self-pay | Admitting: *Deleted

## 2012-11-16 MED ORDER — PROCHLORPERAZINE MALEATE 10 MG PO TABS: 10.0000 mg | ORAL_TABLET | Freq: Four times a day (QID) | ORAL | Status: DC | PRN

## 2012-11-16 NOTE — Telephone Encounter (Signed)
Call from pt requesting refill on Gleevec. (Done 11/3.) Pt reports she had nausea and vomiting that lasted the entire day 10/31. Has since resolved. Instructed pt to call office if this reoccurs. She voiced understanding. Rx sent to pharmacy for antiemetic, per Dr. Truett Perna.

## 2012-11-17 ENCOUNTER — Telehealth: Payer: Self-pay | Admitting: Neurology

## 2012-11-29 ENCOUNTER — Ambulatory Visit: Payer: Medicaid Other | Admitting: Oncology

## 2012-11-29 ENCOUNTER — Telehealth: Payer: Self-pay | Admitting: *Deleted

## 2012-11-29 ENCOUNTER — Other Ambulatory Visit: Payer: Medicaid Other | Admitting: Lab

## 2012-11-29 ENCOUNTER — Other Ambulatory Visit: Payer: Self-pay | Admitting: *Deleted

## 2012-11-29 ENCOUNTER — Telehealth: Payer: Self-pay | Admitting: Oncology

## 2012-11-29 NOTE — Telephone Encounter (Signed)
Pt called, called returned , she r/s appt for today with MD and lab to February 2015, nurse notified

## 2012-11-29 NOTE — Telephone Encounter (Signed)
Pt called to reschedule office visit today. Per scheduler, no availability with MD until Feb. Reviewed with Dr. Truett Perna: Pt to see NP within next 2 weeks. Order entered. Called pt. She understands to expect call from scheduler with new appt.

## 2012-11-30 ENCOUNTER — Telehealth: Payer: Self-pay | Admitting: Oncology

## 2012-11-30 NOTE — Telephone Encounter (Signed)
I have not had acces to Epic for almost 3 weeks.  This required paperwork for this prior Monica [name] was already submitted to the ins company .

## 2012-11-30 NOTE — Telephone Encounter (Signed)
Called pt and left message regarding lab and ML visit r/s from november

## 2012-12-06 ENCOUNTER — Ambulatory Visit (HOSPITAL_COMMUNITY)
Admission: RE | Admit: 2012-12-06 | Discharge: 2012-12-06 | Disposition: A | Discharge status: Home / Self Care | Payer: Medicaid Other | Source: Ambulatory Visit | Attending: Obstetrics and Gynecology | Admitting: Obstetrics and Gynecology

## 2012-12-06 DIAGNOSIS — Z308 Encounter for other contraceptive management: Secondary | ICD-10-CM

## 2012-12-06 DIAGNOSIS — Z3049 Encounter for surveillance of other contraceptives: Secondary | ICD-10-CM | POA: Insufficient documentation

## 2012-12-06 DIAGNOSIS — IMO0002 Reserved for concepts with insufficient information to code with codable children: Secondary | ICD-10-CM

## 2012-12-06 DIAGNOSIS — N971 Female infertility of tubal origin: Secondary | ICD-10-CM

## 2012-12-06 MED ORDER — IOHEXOL 300 MG/ML  SOLN
20.0000 mL | Freq: Once | INTRAMUSCULAR | Status: AC | PRN
  Administered 2012-12-06: 5 mL

## 2012-12-10 ENCOUNTER — Telehealth: Payer: Self-pay | Admitting: *Deleted

## 2012-12-10 NOTE — Telephone Encounter (Signed)
Needs 12/2 appointment rescheduled. Needs am appointment due to childcare. Forwarded message to scheduler, Okey Dupre.

## 2012-12-14 ENCOUNTER — Other Ambulatory Visit: Payer: Medicaid Other | Admitting: Lab

## 2012-12-14 ENCOUNTER — Ambulatory Visit: Payer: Medicaid Other | Admitting: Nurse Practitioner

## 2012-12-17 ENCOUNTER — Telehealth: Payer: Self-pay | Admitting: Oncology

## 2012-12-17 ENCOUNTER — Other Ambulatory Visit (HOSPITAL_BASED_OUTPATIENT_CLINIC_OR_DEPARTMENT_OTHER): Payer: Medicaid Other | Admitting: Lab

## 2012-12-17 ENCOUNTER — Ambulatory Visit (HOSPITAL_BASED_OUTPATIENT_CLINIC_OR_DEPARTMENT_OTHER): Payer: Medicaid Other | Admitting: Nurse Practitioner

## 2012-12-17 VITALS — BP 127/89 | HR 65 | Temp 97.1°F | Resp 20 | Ht 68.0 in | Wt 208.2 lb

## 2012-12-17 DIAGNOSIS — C494 Malignant neoplasm of connective and soft tissue of abdomen: Secondary | ICD-10-CM

## 2012-12-17 DIAGNOSIS — C49A0 Gastrointestinal stromal tumor, unspecified site: Secondary | ICD-10-CM

## 2012-12-17 DIAGNOSIS — G35 Multiple sclerosis: Secondary | ICD-10-CM

## 2012-12-17 DIAGNOSIS — D500 Iron deficiency anemia secondary to blood loss (chronic): Secondary | ICD-10-CM

## 2012-12-17 LAB — COMPREHENSIVE METABOLIC PANEL (CC13)
ALT: 11 U/L (ref 0–55)
AST: 14 U/L (ref 5–34)
Anion Gap: 8 mEq/L (ref 3–11)
CO2: 24 mEq/L (ref 22–29)
Creatinine: 1.1 mg/dL (ref 0.6–1.1)
Total Bilirubin: 0.43 mg/dL (ref 0.20–1.20)

## 2012-12-17 LAB — CBC WITH DIFFERENTIAL/PLATELET
BASO%: 0.7 % (ref 0.0–2.0)
EOS%: 3.6 % (ref 0.0–7.0)
HCT: 29.3 % — ABNORMAL LOW (ref 34.8–46.6)
LYMPH%: 29.5 % (ref 14.0–49.7)
MCH: 29.7 pg (ref 25.1–34.0)
MCHC: 33.2 g/dL (ref 31.5–36.0)
MCV: 89.4 fL (ref 79.5–101.0)
NEUT%: 55.2 % (ref 38.4–76.8)
Platelets: 206 10*3/uL (ref 145–400)

## 2012-12-17 NOTE — Telephone Encounter (Signed)
appts made per 12/4 POF AVS and CAL mailed shh

## 2012-12-17 NOTE — Progress Notes (Signed)
OFFICE PROGRESS NOTE  Interval history:  Monica [name] returns for scheduled followup. She continues Gleevec. She denies nausea/vomiting. No diarrhea. No skin rash. She denies leg swelling or periorbital edema. No shortness of breath or cough. She notes her appetite is diminished. She is trying to eat small frequent meals. She feels the left side of her abdomen is swollen.   Objective: Blood pressure 127/89, pulse 65, temperature 97.1 F (36.2 C), temperature source Oral, resp. rate 20, height 5\' 8"  (1.727 m), weight 208 lb 3.2 oz (94.439 kg), last menstrual period 11/29/2012.  No periorbital edema. No thrush or ulcerations. Lungs are clear. No wheezes or rales. Regular cardiac rhythm. Abdomen is soft and nontender. No hepatomegaly. No mass. Soft fullness at the left upper quadrant when standing. No leg edema. Fine finger I scattered over the upper chest, back and upper arms.  Lab Results: Lab Results  Component Value Date   WBC 2.8* 12/17/2012   HGB 9.7* 12/17/2012   HCT 29.3* 12/17/2012   MCV 89.4 12/17/2012   PLT 206 12/17/2012    Chemistry:    Chemistry      Component Value Date/Time   NA 141 12/17/2012 0955   NA 134* 09/11/2012 0405   K 4.0 12/17/2012 0955   K 3.8 09/11/2012 0405   CL 105 09/11/2012 0405   CO2 24 12/17/2012 0955   CO2 24 09/11/2012 0405   BUN 13.7 12/17/2012 0955   BUN 4* 09/11/2012 0405   CREATININE 1.1 12/17/2012 0955   CREATININE 1.09 09/11/2012 0405      Component Value Date/Time   CALCIUM 9.0 12/17/2012 0955   CALCIUM 8.0* 09/11/2012 0405   ALKPHOS 49 12/17/2012 0955   ALKPHOS 35* 09/04/2012 0659   AST 14 12/17/2012 0955   AST 9 09/04/2012 0659   ALT 11 12/17/2012 0955   ALT 6 09/04/2012 0659   BILITOT 0.43 12/17/2012 0955   BILITOT 0.3 09/04/2012 0659       Studies/Results: Dg Hysterogram (hsg)  12/06/2012   CLINICAL DATA:  Status post Essure device placement  EXAM: HYSTEROSALPINGOGRAM  TECHNIQUE: Following cleansing of the cervix and vagina with Betadine  solution, a hysterosalpingogram was performed using a 5-French hysterosalpingogram catheter and Omnipaque 300 contrast. The patient tolerated the examination without difficulty.  COMPARISON:  None.  FLUOROSCOPY TIME:  2 min, 18 seconds.  FINDINGS: The Essure microinserts are seen in satisfactory location within both of the fallopian tubes.  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  Venous intravasation is identified.  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   Electronically Signed   By: Annia Belt M.D.   On: 12/06/2012 17:26    Medications: I have reviewed the patient's current medications.  Assessment/Plan:  1. Gastrointestinal stromal tumor of the stomach, high grade, stage IIIB (T4 NX), ruptured tumor, status post partial gastrectomy 09/07/2012. She began adjuvant Gleevec 10/19/2012 with a three-year course planned. 2. Multiple sclerosis followed by Dr. Anne Hahn. 3. Anemia secondary to blood loss from #1. She will increase oral iron to twice daily.  Disposition-she appears stable. She will continue Gleevec.  The soft fullness at the left upper quadrant is likely related to surgery. She will contact the office with any change in this area.  We will see her in followup in one month. She knows to contact the office in the interim with any problems.  Plan reviewed with Dr.  Sherrill.  Ned Card ANP/GNP-BC

## 2012-12-30 ENCOUNTER — Encounter: Payer: Self-pay | Admitting: Neurology

## 2012-12-30 ENCOUNTER — Ambulatory Visit (INDEPENDENT_AMBULATORY_CARE_PROVIDER_SITE_OTHER): Payer: Medicaid Other | Admitting: Neurology

## 2012-12-30 VITALS — BP 137/86 | HR 61 | Wt 209.0 lb

## 2012-12-30 DIAGNOSIS — G35 Multiple sclerosis: Secondary | ICD-10-CM | POA: Insufficient documentation

## 2012-12-30 NOTE — Progress Notes (Signed)
Reason for visit:   Monica [name] is an 47 y.o. female  History of present illness:    Past Medical History  Diagnosis Date  . Hypertension   . Hyperlipidemia   . MS (multiple sclerosis)   . Low back pain   . Dyslipidemia   . Vitamin D deficiency     Past Surgical History  Procedure Laterality Date  . Esophagogastroduodenoscopy N/A 09/04/2012    Procedure: ESOPHAGOGASTRODUODENOSCOPY (EGD);  Surgeon: Beverley Fiedler, MD;  Location: [facility];  Service: Gastroenterology;  Laterality: N/A;  . Laparotomy N/A 09/07/2012    Procedure: EXPLORATORY LAPAROTOMY;  Surgeon: Romie Levee, MD;  Location: [facility];  Service: General;  Laterality: N/A;  . Partial gastrectomy N/A 09/07/2012    Procedure: PARTIAL GASTRECTOMY/RESECTION OF ABDOMINAL MASS;  Surgeon: Romie Levee, MD;  Location: [facility];  Service: General;  Laterality: N/A;    Family History  Problem Relation Age of Onset  . Stroke Mother   . Stroke Father   . Schizophrenia Brother   . Hypertension Brother   . Hypertension Brother     Social history:  reports that she has quit smoking. She has never used smokeless tobacco. She reports that she does not drink alcohol or use illicit drugs.   No Known Allergies  Medications:  Current Outpatient Prescriptions on File Prior to Visit  Medication Sig Dispense Refill  . ferrous sulfate (FERROUSUL) 325 (65 FE) MG tablet Take 1 tablet (325 mg total) by mouth daily with breakfast.  30 tablet  0  . GLEEVEC 400 MG tablet TAKE 1 TABLET BY MOUTH ONCE DAILY. WITH MEALS AND A LARGE GLASS OF WATER  30 tablet  1  . lisinopril (PRINIVIL,ZESTRIL) 10 MG tablet Take 10 mg by mouth daily.      . prochlorperazine (COMPAZINE) 10 MG tablet Take 1 tablet (10 mg total) by mouth every 6 (six) hours as needed for nausea or vomiting.  30 tablet  1  . glatiramer (COPAXONE) 20 MG/ML injection Inject 1 mL (20 mg total) into the skin daily.  3 kit  1   No current facility-administered medications on file  prior to visit.    ROS:  Out of a complete 14 system review of symptoms, the patient complains only of the following symptoms, and all other reviewed systems are negative.    Blood pressure 137/86, pulse 61, weight 209 lb (94.802 kg), last menstrual period 11/29/2012.  Physical Exam  General: The patient is alert and cooperative at the time of the examination.  Skin: No significant peripheral edema is noted.   Neurologic Exam  Mental status: The patient is oriented x 3.  Cranial nerves: Facial symmetry is present. Speech is normal, no aphasia or dysarthria is noted. Extraocular movements are full. Visual fields are full.  Motor: The patient has good strength in all 4 extremities.  Sensory examination:  Coordination: The patient has good finger-nose-finger and heel-to-shin bilaterally.  Gait and station: The patient has a normal gait. Tandem gait is normal. Romberg is negative. No drift is seen.  Reflexes: Deep tendon reflexes are symmetric.   MRI brain July 2013:  Impression: Abnormal MRI brain (with and without contrast) demonstrating: 1. Multiple supratentorial and infratentorial chronic demyelinating plaques.  2. No acute plaques are seen.   Assessment/Plan:    Marlan Palau MD 12/30/2012 11:45 AM  [facility] [address] [place], [place] [address]  Phone [phone] Fax [phone]   The patient was not seen today.  The patient can ambulate for a revisit appointment, would not stay to be seen. The patient will be rescheduled with a nurse practitioner.

## 2013-01-10 ENCOUNTER — Other Ambulatory Visit: Payer: Self-pay | Admitting: Oncology

## 2013-01-11 ENCOUNTER — Telehealth: Payer: Self-pay | Admitting: *Deleted

## 2013-01-11 NOTE — Telephone Encounter (Signed)
Feels she is having hair loss from the [facility] and is asking if there is another drug she could take for her condition that does not cause hair loss? Also reports she is having some nausea and occasional vomiting with the Gleevec. Currently has #8 pills left (400 mg daily). Made her aware that incidence of hair loss with this is only 10-15% and other drugs will have similar side effect. She is taking her Gleevec at night now-suggested she take a compazine 1 hour prior to the dose to help with her nausea. She agrees to this and will discuss potential change in therapy with MD at her 01/21/13 visit.

## 2013-01-21 ENCOUNTER — Other Ambulatory Visit (HOSPITAL_BASED_OUTPATIENT_CLINIC_OR_DEPARTMENT_OTHER): Payer: Medicaid Other

## 2013-01-21 ENCOUNTER — Telehealth: Payer: Self-pay | Admitting: Oncology

## 2013-01-21 ENCOUNTER — Ambulatory Visit (HOSPITAL_BASED_OUTPATIENT_CLINIC_OR_DEPARTMENT_OTHER): Payer: Medicaid Other | Admitting: Nurse Practitioner

## 2013-01-21 VITALS — BP 122/75 | HR 66 | Temp 98.3°F | Ht 68.0 in | Wt 205.6 lb

## 2013-01-21 DIAGNOSIS — C494 Malignant neoplasm of connective and soft tissue of abdomen: Secondary | ICD-10-CM

## 2013-01-21 DIAGNOSIS — R112 Nausea with vomiting, unspecified: Secondary | ICD-10-CM

## 2013-01-21 DIAGNOSIS — C49A0 Gastrointestinal stromal tumor, unspecified site: Secondary | ICD-10-CM

## 2013-01-21 DIAGNOSIS — D500 Iron deficiency anemia secondary to blood loss (chronic): Secondary | ICD-10-CM

## 2013-01-21 DIAGNOSIS — G35 Multiple sclerosis: Secondary | ICD-10-CM

## 2013-01-21 DIAGNOSIS — L659 Nonscarring hair loss, unspecified: Secondary | ICD-10-CM

## 2013-01-21 LAB — CBC WITH DIFFERENTIAL/PLATELET
BASO%: 0.3 % (ref 0.0–2.0)
Basophils Absolute: 0 10*3/uL (ref 0.0–0.1)
EOS ABS: 0.1 10*3/uL (ref 0.0–0.5)
EOS%: 1.8 % (ref 0.0–7.0)
HCT: 29.7 % — ABNORMAL LOW (ref 34.8–46.6)
HGB: 9.6 g/dL — ABNORMAL LOW (ref 11.6–15.9)
LYMPH%: 26.2 % (ref 14.0–49.7)
MCH: 28.8 pg (ref 25.1–34.0)
MCHC: 32.3 g/dL (ref 31.5–36.0)
MCV: 89.2 fL (ref 79.5–101.0)
MONO#: 0.3 10*3/uL (ref 0.1–0.9)
MONO%: 9.8 % (ref 0.0–14.0)
NEUT%: 61.9 % (ref 38.4–76.8)
NEUTROS ABS: 2 10*3/uL (ref 1.5–6.5)
PLATELETS: 287 10*3/uL (ref 145–400)
RBC: 3.33 10*6/uL — AB (ref 3.70–5.45)
RDW: 17.2 % — AB (ref 11.2–14.5)
WBC: 3.3 10*3/uL — AB (ref 3.9–10.3)
lymph#: 0.9 10*3/uL (ref 0.9–3.3)

## 2013-01-21 LAB — COMPREHENSIVE METABOLIC PANEL (CC13)
ALBUMIN: 3.6 g/dL (ref 3.5–5.0)
ALK PHOS: 60 U/L (ref 40–150)
ALT: 7 U/L (ref 0–55)
AST: 9 U/L (ref 5–34)
Anion Gap: 9 mEq/L (ref 3–11)
BILIRUBIN TOTAL: 0.47 mg/dL (ref 0.20–1.20)
BUN: 6.9 mg/dL — ABNORMAL LOW (ref 7.0–26.0)
CO2: 23 mEq/L (ref 22–29)
Calcium: 8.8 mg/dL (ref 8.4–10.4)
Chloride: 107 mEq/L (ref 98–109)
Creatinine: 1 mg/dL (ref 0.6–1.1)
Glucose: 101 mg/dl (ref 70–140)
POTASSIUM: 3.6 meq/L (ref 3.5–5.1)
SODIUM: 139 meq/L (ref 136–145)
TOTAL PROTEIN: 6.8 g/dL (ref 6.4–8.3)

## 2013-01-21 NOTE — Patient Instructions (Signed)
Try Imodium for the diarrhea. Follow package instructions.

## 2013-01-21 NOTE — Progress Notes (Addendum)
OFFICE PROGRESS NOTE  Interval history:  Ms. Tweten returns for scheduled followup. She estimates vomiting 1 time per week. This typically occurs about 2 hours after taking Gleevec. For the past month she has noted approximately 2 loose stools every other day. She describes the stool as watery. Pepto-Bismol helps "a little". No skin rash. She denies cough and shortness of breath. She has intermittent knee edema. Main concern is hair loss.   Objective: Filed Vitals:   01/21/13 1134  BP: 122/75  Pulse: 66  Temp: 98.3 F (36.8 C)   Oropharynx is without thrush or ulceration. No palpable cervical, supraclavicular or axillary lymph nodes. Lungs are clear. Regular cardiac rhythm. Abdomen soft and nontender. No mass. No organomegaly. No leg edema. Calves soft and nontender. Fine faint rash scattered over the upper chest and back   Lab Results: Lab Results  Component Value Date   WBC 3.3* 01/21/2013   HGB 9.6* 01/21/2013   HCT 29.7* 01/21/2013   MCV 89.2 01/21/2013   PLT 287 01/21/2013   NEUTROABS 2.0 01/21/2013    Chemistry:    Chemistry      Component Value Date/Time   NA 141 12/17/2012 0955   NA 134* 09/11/2012 0405   K 4.0 12/17/2012 0955   K 3.8 09/11/2012 0405   CL 105 09/11/2012 0405   CO2 24 12/17/2012 0955   CO2 24 09/11/2012 0405   BUN 13.7 12/17/2012 0955   BUN 4* 09/11/2012 0405   CREATININE 1.1 12/17/2012 0955   CREATININE 1.09 09/11/2012 0405      Component Value Date/Time   CALCIUM 9.0 12/17/2012 0955   CALCIUM 8.0* 09/11/2012 0405   ALKPHOS 49 12/17/2012 0955   ALKPHOS 35* 09/04/2012 0659   AST 14 12/17/2012 0955   AST 9 09/04/2012 0659   ALT 11 12/17/2012 0955   ALT 6 09/04/2012 0659   BILITOT 0.43 12/17/2012 0955   BILITOT 0.3 09/04/2012 0659       Studies/Results: No results found.  Medications: I have reviewed the patient's current medications.  Assessment/Plan: 1. Gastrointestinal stromal tumor of the stomach, high grade, stage IIIB (T4 NX), ruptured tumor, status post  partial gastrectomy 09/07/2012. She began adjuvant Gleevec 10/19/2012 with a three-year course planned. 2. Multiple sclerosis followed by Dr. Jannifer Franklin. 3. Anemia secondary to blood loss from #1.  4. Intermittent nausea/vomiting and loose stools. Question related to [place]. She will try Compazine for the nausea and Imodium for the loose stools. 5. Hair loss. Question related to [place].   Dispositon-she appears stable. We strongly encouraged her to continue [place] at the current dose due to the significant chance of developing recurrent disease. She will try Compazine and Imodium as noted above and will contact the office if these measures are not effective. We will see her in followup in one month.  Patient seen with Dr. Benay Spice.   Ned Card ANP/GNP-BC   This was a shared visit with Ned Card. She will continue Gleevec at the current dose. Julieanne Manson, M.D.

## 2013-01-21 NOTE — Telephone Encounter (Signed)
gv and pritned appt sched and avs for Feb

## 2013-02-16 ENCOUNTER — Telehealth: Payer: Self-pay | Admitting: *Deleted

## 2013-02-16 NOTE — Telephone Encounter (Signed)
Call from pt requesting to reschedule 2/6 appt. Needs late morning/ early afternoon appts so she can get her son on and off the school bus (early release for son 2/6). Pt has appt for 2/20. Will review with provider for sooner appt if necessary.  Reviewed with Lattie Haw, NP. 2/20 appt is OK. Instructed pt to pick up next cycle of Gleevec. Appt confirmed.

## 2013-02-18 ENCOUNTER — Other Ambulatory Visit: Payer: Medicaid Other

## 2013-02-18 ENCOUNTER — Ambulatory Visit: Payer: Medicaid Other | Admitting: Oncology

## 2013-03-04 ENCOUNTER — Other Ambulatory Visit: Payer: Medicaid Other

## 2013-03-04 ENCOUNTER — Ambulatory Visit: Payer: Medicaid Other | Admitting: Oncology

## 2013-03-07 ENCOUNTER — Telehealth: Payer: Self-pay | Admitting: *Deleted

## 2013-03-07 NOTE — Telephone Encounter (Signed)
Message from pt requesting to reschedule appt. Missed 2/20 appt because transportation company was shut down. Needs mid morning or early afternoon so she can get her child on/ off the bus.

## 2013-03-08 ENCOUNTER — Telehealth: Payer: Self-pay | Admitting: Oncology

## 2013-03-08 NOTE — Telephone Encounter (Signed)
s.w. pt and advised on nxt week appt.Marland KitchenMarland KitchenMarland KitchenMarland Kitchenpt ok adn aware

## 2013-03-16 ENCOUNTER — Other Ambulatory Visit (HOSPITAL_BASED_OUTPATIENT_CLINIC_OR_DEPARTMENT_OTHER): Payer: Medicaid Other

## 2013-03-16 ENCOUNTER — Ambulatory Visit (HOSPITAL_BASED_OUTPATIENT_CLINIC_OR_DEPARTMENT_OTHER): Payer: Medicaid Other | Admitting: Nurse Practitioner

## 2013-03-16 ENCOUNTER — Telehealth: Payer: Self-pay | Admitting: *Deleted

## 2013-03-16 ENCOUNTER — Encounter (INDEPENDENT_AMBULATORY_CARE_PROVIDER_SITE_OTHER): Payer: Self-pay

## 2013-03-16 VITALS — BP 112/70 | HR 70 | Temp 97.1°F | Resp 18 | Ht 68.0 in | Wt 206.0 lb

## 2013-03-16 DIAGNOSIS — C49A0 Gastrointestinal stromal tumor, unspecified site: Secondary | ICD-10-CM

## 2013-03-16 DIAGNOSIS — L659 Nonscarring hair loss, unspecified: Secondary | ICD-10-CM

## 2013-03-16 DIAGNOSIS — C494 Malignant neoplasm of connective and soft tissue of abdomen: Secondary | ICD-10-CM

## 2013-03-16 DIAGNOSIS — D709 Neutropenia, unspecified: Secondary | ICD-10-CM

## 2013-03-16 DIAGNOSIS — D500 Iron deficiency anemia secondary to blood loss (chronic): Secondary | ICD-10-CM

## 2013-03-16 DIAGNOSIS — G35 Multiple sclerosis: Secondary | ICD-10-CM

## 2013-03-16 LAB — COMPREHENSIVE METABOLIC PANEL (CC13)
ALK PHOS: 40 U/L (ref 40–150)
ALT: 7 U/L (ref 0–55)
ANION GAP: 6 meq/L (ref 3–11)
AST: 13 U/L (ref 5–34)
Albumin: 3.7 g/dL (ref 3.5–5.0)
BILIRUBIN TOTAL: 0.48 mg/dL (ref 0.20–1.20)
BUN: 9.7 mg/dL (ref 7.0–26.0)
CO2: 25 meq/L (ref 22–29)
CREATININE: 1 mg/dL (ref 0.6–1.1)
Calcium: 8.5 mg/dL (ref 8.4–10.4)
Chloride: 109 mEq/L (ref 98–109)
Glucose: 80 mg/dl (ref 70–140)
Potassium: 3.5 mEq/L (ref 3.5–5.1)
Sodium: 141 mEq/L (ref 136–145)
Total Protein: 6 g/dL — ABNORMAL LOW (ref 6.4–8.3)

## 2013-03-16 LAB — CBC WITH DIFFERENTIAL/PLATELET
BASO%: 1.1 % (ref 0.0–2.0)
Basophils Absolute: 0 10*3/uL (ref 0.0–0.1)
EOS%: 2 % (ref 0.0–7.0)
Eosinophils Absolute: 0 10*3/uL (ref 0.0–0.5)
HEMATOCRIT: 27.6 % — AB (ref 34.8–46.6)
HGB: 8.9 g/dL — ABNORMAL LOW (ref 11.6–15.9)
LYMPH%: 37.6 % (ref 14.0–49.7)
MCH: 31.6 pg (ref 25.1–34.0)
MCHC: 32.2 g/dL (ref 31.5–36.0)
MCV: 98.3 fL (ref 79.5–101.0)
MONO#: 0.2 10*3/uL (ref 0.1–0.9)
MONO%: 11.2 % (ref 0.0–14.0)
NEUT#: 0.8 10*3/uL — ABNORMAL LOW (ref 1.5–6.5)
NEUT%: 48.1 % (ref 38.4–76.8)
PLATELETS: 177 10*3/uL (ref 145–400)
RBC: 2.81 10*6/uL — AB (ref 3.70–5.45)
RDW: 15.3 % — ABNORMAL HIGH (ref 11.2–14.5)
WBC: 1.7 10*3/uL — ABNORMAL LOW (ref 3.9–10.3)
lymph#: 0.6 10*3/uL — ABNORMAL LOW (ref 0.9–3.3)

## 2013-03-16 NOTE — Progress Notes (Signed)
OFFICE PROGRESS NOTE  Interval history:  [name] returns for scheduled followup. She continues Gleevec 400 mg daily. She denies nausea/vomiting. No mouth sores. No diarrhea. She continues to note intermittent knee swelling. No skin rash. She denies fever. No shaking chills.   Objective: Filed Vitals:   03/16/13 1139  BP: 112/70  Pulse: 70  Temp: 97.1 F (36.2 C)  Resp: 18   Oropharynx is without thrush or ulceration. Lungs are clear. Regular cardiac rhythm. Abdomen soft and nontender. No mass. No organomegaly. Trace lower leg edema bilaterally. Calves soft and nontender. No skin rash.   Lab Results: Lab Results  Component Value Date   WBC 1.7* 03/16/2013   HGB 8.9* 03/16/2013   HCT 27.6* 03/16/2013   MCV 98.3 03/16/2013   PLT 177 03/16/2013   NEUTROABS 0.8* 03/16/2013    Chemistry:    Chemistry      Component Value Date/Time   NA 141 03/16/2013 1116   NA 134* 09/11/2012 0405   K 3.5 03/16/2013 1116   K 3.8 09/11/2012 0405   CL 105 09/11/2012 0405   CO2 25 03/16/2013 1116   CO2 24 09/11/2012 0405   BUN 9.7 03/16/2013 1116   BUN 4* 09/11/2012 0405   CREATININE 1.0 03/16/2013 1116   CREATININE 1.09 09/11/2012 0405      Component Value Date/Time   CALCIUM 8.5 03/16/2013 1116   CALCIUM 8.0* 09/11/2012 0405   ALKPHOS 40 03/16/2013 1116   ALKPHOS 35* 09/04/2012 0659   AST 13 03/16/2013 1116   AST 9 09/04/2012 0659   ALT 7 03/16/2013 1116   ALT 6 09/04/2012 0659   BILITOT 0.48 03/16/2013 1116   BILITOT 0.3 09/04/2012 0659       Studies/Results: No results found.  Medications: I have reviewed the patient's current medications.  Assessment/Plan: 1. Gastrointestinal stromal tumor of the stomach, high grade, stage IIIB (T4 NX), ruptured tumor, status post partial gastrectomy 09/07/2012. She began adjuvant Gleevec 10/19/2012 with a three-year course planned. 2. Multiple sclerosis followed by Dr. Jannifer Franklin. 3. Anemia secondary to blood loss from #1.  4. Hair loss. Question related to  [place]. 5. Neutropenia.   Dispositon-she appears stable. She is neutropenic. We are placing the [place] on hold. She will return in one week for a followup CBC. She understands to contact the office with fever, shaking chills or other signs of infection. She will return for a followup visit in 4 weeks.  Plan reviewed with Dr. Benay Spice.   Ned Card ANP/GNP-BC

## 2013-03-16 NOTE — Telephone Encounter (Signed)
sw pt gv appt for labs on 03/23/13@ 11am, and for 04/14/13 w/ labs@ 10am and ov@ 10:30am. Pt is aware of her tx...td

## 2013-03-23 ENCOUNTER — Other Ambulatory Visit (HOSPITAL_BASED_OUTPATIENT_CLINIC_OR_DEPARTMENT_OTHER): Payer: Medicaid Other

## 2013-03-23 DIAGNOSIS — C49A0 Gastrointestinal stromal tumor, unspecified site: Secondary | ICD-10-CM

## 2013-03-23 DIAGNOSIS — C494 Malignant neoplasm of connective and soft tissue of abdomen: Secondary | ICD-10-CM

## 2013-03-23 DIAGNOSIS — D500 Iron deficiency anemia secondary to blood loss (chronic): Secondary | ICD-10-CM

## 2013-03-23 DIAGNOSIS — D709 Neutropenia, unspecified: Secondary | ICD-10-CM

## 2013-03-23 LAB — CBC WITH DIFFERENTIAL/PLATELET
BASO%: 0.5 % (ref 0.0–2.0)
BASOS ABS: 0 10*3/uL (ref 0.0–0.1)
EOS%: 1.4 % (ref 0.0–7.0)
Eosinophils Absolute: 0 10*3/uL (ref 0.0–0.5)
HEMATOCRIT: 27.7 % — AB (ref 34.8–46.6)
HEMOGLOBIN: 9 g/dL — AB (ref 11.6–15.9)
LYMPH#: 0.7 10*3/uL — AB (ref 0.9–3.3)
LYMPH%: 32 % (ref 14.0–49.7)
MCH: 31.5 pg (ref 25.1–34.0)
MCHC: 32.5 g/dL (ref 31.5–36.0)
MCV: 96.9 fL (ref 79.5–101.0)
MONO#: 0.3 10*3/uL (ref 0.1–0.9)
MONO%: 11.4 % (ref 0.0–14.0)
NEUT#: 1.2 10*3/uL — ABNORMAL LOW (ref 1.5–6.5)
NEUT%: 54.7 % (ref 38.4–76.8)
PLATELETS: 205 10*3/uL (ref 145–400)
RBC: 2.86 10*6/uL — ABNORMAL LOW (ref 3.70–5.45)
RDW: 15.1 % — ABNORMAL HIGH (ref 11.2–14.5)
WBC: 2.2 10*3/uL — ABNORMAL LOW (ref 3.9–10.3)

## 2013-03-24 ENCOUNTER — Telehealth: Payer: Self-pay | Admitting: *Deleted

## 2013-03-24 NOTE — Telephone Encounter (Signed)
Per Dr. Benay Spice : Resume Gleevec at 400 mg daily. Check CBC weekly X 3, will change dose to 300 mg if ANC falls. Patient refuses to take this dose-she reports she will only take 100 mg / day or she will refuse to take the [place]. Says it makes her ankles/knees swell and eyes feel puffy when she is taking it. Stressed to her if the recommended dose is not taken, it will be less effective. She insists that she will not take less than 100 mg day. Will forward her concerns to MD and call her back after MD reviews.

## 2013-03-24 NOTE — Telephone Encounter (Signed)
Patient called back and said she is willing to take Gleevec 300 mg daily.

## 2013-03-25 ENCOUNTER — Telehealth: Payer: Self-pay | Admitting: Oncology

## 2013-03-25 ENCOUNTER — Other Ambulatory Visit: Payer: Self-pay | Admitting: *Deleted

## 2013-03-25 DIAGNOSIS — C494 Malignant neoplasm of connective and soft tissue of abdomen: Secondary | ICD-10-CM

## 2013-03-25 MED ORDER — IMATINIB MESYLATE 100 MG PO TABS: 300.0000 mg | ORAL_TABLET | Freq: Every day | ORAL | Status: DC

## 2013-03-25 NOTE — Telephone Encounter (Signed)
Talked to pt and gave her appt for labs and MD from march to April 2015

## 2013-03-25 NOTE — Progress Notes (Signed)
Dr. Benay Spice agrees to change to 300 mg daily. Script sent to [facility]. Attempted to call [facility] regarding this and to have weekly lab-starting 3/18 (POF entered). No answer and no voice mailbox has been set up.

## 2013-03-30 ENCOUNTER — Other Ambulatory Visit (HOSPITAL_BASED_OUTPATIENT_CLINIC_OR_DEPARTMENT_OTHER): Payer: Medicaid Other

## 2013-03-30 DIAGNOSIS — C494 Malignant neoplasm of connective and soft tissue of abdomen: Secondary | ICD-10-CM

## 2013-03-30 LAB — CBC WITH DIFFERENTIAL/PLATELET
BASO%: 0.3 % (ref 0.0–2.0)
Basophils Absolute: 0 10*3/uL (ref 0.0–0.1)
EOS ABS: 0.1 10*3/uL (ref 0.0–0.5)
EOS%: 3 % (ref 0.0–7.0)
HCT: 28.2 % — ABNORMAL LOW (ref 34.8–46.6)
HGB: 9.2 g/dL — ABNORMAL LOW (ref 11.6–15.9)
LYMPH#: 1.1 10*3/uL (ref 0.9–3.3)
LYMPH%: 38.3 % (ref 14.0–49.7)
MCH: 32.1 pg (ref 25.1–34.0)
MCHC: 32.8 g/dL (ref 31.5–36.0)
MCV: 98 fL (ref 79.5–101.0)
MONO#: 0.4 10*3/uL (ref 0.1–0.9)
MONO%: 13.5 % (ref 0.0–14.0)
NEUT%: 44.9 % (ref 38.4–76.8)
NEUTROS ABS: 1.2 10*3/uL — AB (ref 1.5–6.5)
PLATELETS: 171 10*3/uL (ref 145–400)
RBC: 2.88 10*6/uL — AB (ref 3.70–5.45)
RDW: 15.2 % — AB (ref 11.2–14.5)
WBC: 2.8 10*3/uL — AB (ref 3.9–10.3)

## 2013-04-06 ENCOUNTER — Other Ambulatory Visit: Payer: Medicaid Other

## 2013-04-07 ENCOUNTER — Telehealth: Payer: Self-pay | Admitting: *Deleted

## 2013-04-07 NOTE — Telephone Encounter (Addendum)
Called pt to follow up on missed lab appts. She reports she has not been taking Gleevec, holding since last appt. Pt declined to reschedule lab appt. Stated she is going out of town Midwife. Would like to follow up as scheduled with lab and MD on 04/14/13. Instructed pt not to take [place] until labs are reviewed, she voiced understanding. Informed pt of dose change for Gleevec as well. She stated she will pick up Rx at next visit.

## 2013-04-11 ENCOUNTER — Telehealth: Payer: Self-pay | Admitting: *Deleted

## 2013-04-11 NOTE — Telephone Encounter (Signed)
Pt called requesting "I want to go down on my Gleevec to 100 mg instead of taking 300 mg"  Pt reports that she is having "some diarrhea, ankles are swelling and my hair is falling out a lot"  Pt states she is taking anti-diarrheal meds and also drinking plenty of fluids; no other c/o voiced.  Next MD appt 04/14/13.  Note to Dr. Benay Spice.

## 2013-04-11 NOTE — Telephone Encounter (Signed)
Per Dr. Benay Spice; notified pt that it's OK to take 100 mg Gleevec daily instead of 300 mg and will discuss further @ MD visit 4/2.  Pt verbalized understanding and expressed appreciation for call back.

## 2013-04-13 ENCOUNTER — Other Ambulatory Visit: Payer: Medicaid Other

## 2013-04-14 ENCOUNTER — Ambulatory Visit: Payer: Medicaid Other | Admitting: Nurse Practitioner

## 2013-04-14 ENCOUNTER — Other Ambulatory Visit (HOSPITAL_BASED_OUTPATIENT_CLINIC_OR_DEPARTMENT_OTHER): Payer: Medicaid Other

## 2013-04-14 ENCOUNTER — Other Ambulatory Visit: Payer: Medicaid Other

## 2013-04-14 ENCOUNTER — Other Ambulatory Visit: Payer: Self-pay | Admitting: *Deleted

## 2013-04-14 ENCOUNTER — Ambulatory Visit (HOSPITAL_BASED_OUTPATIENT_CLINIC_OR_DEPARTMENT_OTHER): Payer: Medicaid Other | Admitting: Oncology

## 2013-04-14 VITALS — BP 158/90 | HR 63 | Temp 97.4°F | Resp 18 | Ht 68.0 in | Wt 204.1 lb

## 2013-04-14 DIAGNOSIS — D6481 Anemia due to antineoplastic chemotherapy: Secondary | ICD-10-CM

## 2013-04-14 DIAGNOSIS — C169 Malignant neoplasm of stomach, unspecified: Secondary | ICD-10-CM

## 2013-04-14 DIAGNOSIS — D709 Neutropenia, unspecified: Secondary | ICD-10-CM

## 2013-04-14 DIAGNOSIS — D500 Iron deficiency anemia secondary to blood loss (chronic): Secondary | ICD-10-CM

## 2013-04-14 DIAGNOSIS — C49A0 Gastrointestinal stromal tumor, unspecified site: Secondary | ICD-10-CM

## 2013-04-14 DIAGNOSIS — L659 Nonscarring hair loss, unspecified: Secondary | ICD-10-CM

## 2013-04-14 DIAGNOSIS — T451X5A Adverse effect of antineoplastic and immunosuppressive drugs, initial encounter: Secondary | ICD-10-CM

## 2013-04-14 LAB — COMPREHENSIVE METABOLIC PANEL (CC13)
ALBUMIN: 3.9 g/dL (ref 3.5–5.0)
ALT: 7 U/L (ref 0–55)
AST: 10 U/L (ref 5–34)
Alkaline Phosphatase: 44 U/L (ref 40–150)
Anion Gap: 11 mEq/L (ref 3–11)
BUN: 10.6 mg/dL (ref 7.0–26.0)
CALCIUM: 8.9 mg/dL (ref 8.4–10.4)
CHLORIDE: 108 meq/L (ref 98–109)
CO2: 23 mEq/L (ref 22–29)
Creatinine: 0.9 mg/dL (ref 0.6–1.1)
GLUCOSE: 92 mg/dL (ref 70–140)
POTASSIUM: 3.5 meq/L (ref 3.5–5.1)
SODIUM: 142 meq/L (ref 136–145)
TOTAL PROTEIN: 6.9 g/dL (ref 6.4–8.3)
Total Bilirubin: 0.44 mg/dL (ref 0.20–1.20)

## 2013-04-14 LAB — CBC WITH DIFFERENTIAL/PLATELET
BASO%: 0.6 % (ref 0.0–2.0)
Basophils Absolute: 0 10*3/uL (ref 0.0–0.1)
EOS ABS: 0.1 10*3/uL (ref 0.0–0.5)
EOS%: 2.1 % (ref 0.0–7.0)
HEMATOCRIT: 33.8 % — AB (ref 34.8–46.6)
HEMOGLOBIN: 10.9 g/dL — AB (ref 11.6–15.9)
LYMPH#: 0.7 10*3/uL — AB (ref 0.9–3.3)
LYMPH%: 20.9 % (ref 14.0–49.7)
MCH: 31.4 pg (ref 25.1–34.0)
MCHC: 32.1 g/dL (ref 31.5–36.0)
MCV: 97.6 fL (ref 79.5–101.0)
MONO#: 0.5 10*3/uL (ref 0.1–0.9)
MONO%: 14.4 % — ABNORMAL HIGH (ref 0.0–14.0)
NEUT%: 62 % (ref 38.4–76.8)
NEUTROS ABS: 2 10*3/uL (ref 1.5–6.5)
PLATELETS: 202 10*3/uL (ref 145–400)
RBC: 3.47 10*6/uL — ABNORMAL LOW (ref 3.70–5.45)
RDW: 14.3 % (ref 11.2–14.5)
WBC: 3.2 10*3/uL — AB (ref 3.9–10.3)

## 2013-04-14 MED ORDER — IMATINIB MESYLATE 100 MG PO TABS: 200.0000 mg | ORAL_TABLET | Freq: Every day | ORAL | Status: DC

## 2013-04-14 NOTE — Progress Notes (Signed)
  [place] OFFICE PROGRESS NOTE   Diagnosis:GIST  INTERVAL HISTORY:  The Gleevec has been on hold since she was here on 03/16/2013. She reports improvement in diarrhea.  Objective:  Vital signs in last 24 hours:  Blood pressure 158/90, pulse 63, temperature 97.4 F (36.3 C), temperature source Oral, resp. rate 18, height 5\' 8"  (1.727 m), weight 204 lb 1.6 oz (92.579 kg), SpO2 100.00%.    HEENT: No thrush or ulcers Resp: Lungs clear bilaterally Cardio: Regular rate and rhythm GI: No hepatomegaly, nontender, no mass Vascular: No leg edema  Skin: No rash     Lab Results:  Lab Results  Component Value Date   WBC 3.2* 04/14/2013   HGB 10.9* 04/14/2013   HCT 33.8* 04/14/2013   MCV 97.6 04/14/2013   PLT 202 04/14/2013   NEUTROABS 2.0 04/14/2013     Medications: I have reviewed the patient's current medications.  Assessment/Plan: 1. Gastrointestinal stromal tumor of the stomach, high grade, stage IIIB (T4 NX), ruptured tumor, status post partial gastrectomy 09/07/2012. She began adjuvant Gleevec 10/19/2012 with a three-year course planned. 2. Multiple sclerosis followed by Dr. Jannifer Franklin. 3. Anemia secondary to blood loss from #1 and Gleevec 4. Hair loss. Question related to [place]. 5. 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 I encouraged her to resume [place]. She is reluctant to take Gleevec at a dose of 300 mg daily. She agrees to start [place] at a dose of 200 mg daily. She will return for a CBC in 2 weeks. She will be scheduled for an office visit in 4 weeks.  Betsy Coder, MD  04/14/2013  4:00 PM

## 2013-04-15 ENCOUNTER — Telehealth: Payer: Self-pay | Admitting: Oncology

## 2013-04-15 NOTE — Telephone Encounter (Signed)
s.w. pt and advised on April appts.....pt ok adn aware °

## 2013-04-28 ENCOUNTER — Other Ambulatory Visit (HOSPITAL_BASED_OUTPATIENT_CLINIC_OR_DEPARTMENT_OTHER): Payer: Medicaid Other

## 2013-04-28 DIAGNOSIS — C49A0 Gastrointestinal stromal tumor, unspecified site: Secondary | ICD-10-CM

## 2013-04-28 DIAGNOSIS — C169 Malignant neoplasm of stomach, unspecified: Secondary | ICD-10-CM

## 2013-04-28 DIAGNOSIS — D6481 Anemia due to antineoplastic chemotherapy: Secondary | ICD-10-CM

## 2013-04-28 DIAGNOSIS — D500 Iron deficiency anemia secondary to blood loss (chronic): Secondary | ICD-10-CM

## 2013-04-28 DIAGNOSIS — D709 Neutropenia, unspecified: Secondary | ICD-10-CM

## 2013-04-28 DIAGNOSIS — T451X5A Adverse effect of antineoplastic and immunosuppressive drugs, initial encounter: Secondary | ICD-10-CM

## 2013-04-28 LAB — CBC WITH DIFFERENTIAL/PLATELET
BASO%: 0.5 % (ref 0.0–2.0)
Basophils Absolute: 0 10*3/uL (ref 0.0–0.1)
EOS ABS: 0.1 10*3/uL (ref 0.0–0.5)
EOS%: 2.8 % (ref 0.0–7.0)
HCT: 34.5 % — ABNORMAL LOW (ref 34.8–46.6)
HGB: 11.1 g/dL — ABNORMAL LOW (ref 11.6–15.9)
LYMPH%: 37.5 % (ref 14.0–49.7)
MCH: 30.8 pg (ref 25.1–34.0)
MCHC: 32.2 g/dL (ref 31.5–36.0)
MCV: 95.8 fL (ref 79.5–101.0)
MONO#: 0.2 10*3/uL (ref 0.1–0.9)
MONO%: 11.1 % (ref 0.0–14.0)
NEUT%: 48.1 % (ref 38.4–76.8)
NEUTROS ABS: 1 10*3/uL — AB (ref 1.5–6.5)
PLATELETS: 218 10*3/uL (ref 145–400)
RBC: 3.6 10*6/uL — ABNORMAL LOW (ref 3.70–5.45)
RDW: 13.3 % (ref 11.2–14.5)
WBC: 2.2 10*3/uL — ABNORMAL LOW (ref 3.9–10.3)
lymph#: 0.8 10*3/uL — ABNORMAL LOW (ref 0.9–3.3)

## 2013-04-29 ENCOUNTER — Telehealth: Payer: Self-pay | Admitting: *Deleted

## 2013-04-29 ENCOUNTER — Other Ambulatory Visit: Payer: Self-pay | Admitting: *Deleted

## 2013-04-29 ENCOUNTER — Telehealth: Payer: Self-pay | Admitting: Oncology

## 2013-04-29 DIAGNOSIS — C49A0 Gastrointestinal stromal tumor, unspecified site: Secondary | ICD-10-CM

## 2013-04-29 NOTE — Telephone Encounter (Signed)
per pof to sch pt for lab only-cld & spoke to pt to adv of time & date

## 2013-04-29 NOTE — Telephone Encounter (Signed)
Message copied by Brien Few on Fri Apr 29, 2013 12:00 PM ------      Message from: Betsy Coder B      Created: Thu Apr 28, 2013  7:35 PM       Please call patient, neutrophils are again low, cont. gleevec at 200mg  dose, repeat cbc 1 week ------

## 2013-04-29 NOTE — Telephone Encounter (Signed)
Called pt, she confirms she is taking Gleevec 200 mg daily. Instructed her to continue same dose. Neutrophils are low, will recheck next week, pt voiced frustration at having to come in two weeks in a row. Explained that monitoring of blood counts is to ensure her safety on Gleevec. She voiced understanding. Knows to expect call from schedulers.

## 2013-05-05 ENCOUNTER — Other Ambulatory Visit (HOSPITAL_BASED_OUTPATIENT_CLINIC_OR_DEPARTMENT_OTHER): Payer: Medicaid Other

## 2013-05-05 DIAGNOSIS — C169 Malignant neoplasm of stomach, unspecified: Secondary | ICD-10-CM

## 2013-05-05 DIAGNOSIS — C49A0 Gastrointestinal stromal tumor, unspecified site: Secondary | ICD-10-CM

## 2013-05-05 LAB — CBC WITH DIFFERENTIAL/PLATELET
BASO%: 0.4 % (ref 0.0–2.0)
Basophils Absolute: 0 10*3/uL (ref 0.0–0.1)
EOS ABS: 0.1 10*3/uL (ref 0.0–0.5)
EOS%: 3.8 % (ref 0.0–7.0)
HCT: 33.8 % — ABNORMAL LOW (ref 34.8–46.6)
HGB: 10.9 g/dL — ABNORMAL LOW (ref 11.6–15.9)
LYMPH%: 38.3 % (ref 14.0–49.7)
MCH: 30.5 pg (ref 25.1–34.0)
MCHC: 32.2 g/dL (ref 31.5–36.0)
MCV: 94.7 fL (ref 79.5–101.0)
MONO#: 0.2 10*3/uL (ref 0.1–0.9)
MONO%: 9.8 % (ref 0.0–14.0)
NEUT%: 47.7 % (ref 38.4–76.8)
NEUTROS ABS: 1.1 10*3/uL — AB (ref 1.5–6.5)
NRBC: 0 % (ref 0–0)
PLATELETS: 235 10*3/uL (ref 145–400)
RBC: 3.57 10*6/uL — ABNORMAL LOW (ref 3.70–5.45)
RDW: 13.4 % (ref 11.2–14.5)
WBC: 2.4 10*3/uL — AB (ref 3.9–10.3)
lymph#: 0.9 10*3/uL (ref 0.9–3.3)

## 2013-05-06 ENCOUNTER — Encounter: Payer: Self-pay | Admitting: *Deleted

## 2013-05-06 ENCOUNTER — Telehealth: Payer: Self-pay | Admitting: *Deleted

## 2013-05-06 NOTE — Telephone Encounter (Signed)
Called pt with lab results. She feels well, denies fevers. Instructed her to continue [place]. Pt confirms she is taking 200 mg daily. Understands to follow up 4/30. Pt requesting assistance with transportation. Will forward request to social work.

## 2013-05-06 NOTE — Progress Notes (Signed)
[place] Work  Clinical Social Work was referred by nurse for assessment of psychosocial needs due to transportation concerns.  Clinical Social Worker contacted patient at home to offer support and assess for needs.  Pt reports to often use the bus and can currently manage that mode of transport. Pt declined [employer] services currently, she stated "too much red tape". Pt aware CSW can assist with [employer] application if the need arises. CSW to provide pt with bus passes on 06/05/13. No other concerns currently.   Clinical Social Work interventions: CSW to assist with bus passes.    Loren Racer, LCSW Clinical Social Worker [facility] [place] Wednesday, Thursday and Friday Phone: [phone] Fax: [phone]

## 2013-05-06 NOTE — Telephone Encounter (Signed)
Message copied by Brien Few on Fri May 06, 2013 11:11 AM ------      Message from: Betsy Coder B      Created: Thu May 05, 2013  8:23 PM       Please call patient, continue gleevec, f/u 4/30 as scheduled      Cancel previous result message ------

## 2013-05-12 ENCOUNTER — Other Ambulatory Visit: Payer: Medicaid Other

## 2013-05-12 ENCOUNTER — Ambulatory Visit: Payer: Medicaid Other | Admitting: Nurse Practitioner

## 2013-05-12 ENCOUNTER — Telehealth: Payer: Self-pay | Admitting: *Deleted

## 2013-05-12 NOTE — Telephone Encounter (Signed)
Message from pt stating she overslept, requests to reschedule. Order sent to scheduler.

## 2013-05-14 ENCOUNTER — Telehealth: Payer: Self-pay | Admitting: Oncology

## 2013-05-14 NOTE — Telephone Encounter (Signed)
S/w the pt and she is aware of her may r/s appts that she missed last week.

## 2013-05-16 ENCOUNTER — Other Ambulatory Visit: Payer: Self-pay | Admitting: Neurology

## 2013-05-19 ENCOUNTER — Telehealth: Payer: Self-pay | Admitting: *Deleted

## 2013-05-19 ENCOUNTER — Ambulatory Visit: Payer: Medicaid Other | Admitting: Nurse Practitioner

## 2013-05-19 ENCOUNTER — Other Ambulatory Visit: Payer: Medicaid Other

## 2013-05-19 NOTE — Telephone Encounter (Signed)
Left VM that she unable to make appointment today-waiting on an inspector to come to her apartment and they are not there yet. Forwarded call to [facility] in scheduling department.

## 2013-05-20 ENCOUNTER — Telehealth: Payer: Self-pay | Admitting: Oncology

## 2013-05-20 NOTE — Telephone Encounter (Signed)
Called pt r/ sappt to 05/23/13 lab and MD

## 2013-05-23 ENCOUNTER — Other Ambulatory Visit (HOSPITAL_BASED_OUTPATIENT_CLINIC_OR_DEPARTMENT_OTHER): Payer: Medicaid Other

## 2013-05-23 ENCOUNTER — Ambulatory Visit (HOSPITAL_BASED_OUTPATIENT_CLINIC_OR_DEPARTMENT_OTHER): Payer: Medicaid Other | Admitting: Nurse Practitioner

## 2013-05-23 VITALS — BP 142/98 | HR 60 | Temp 98.1°F | Resp 20 | Ht 68.0 in | Wt 206.9 lb

## 2013-05-23 DIAGNOSIS — T451X5A Adverse effect of antineoplastic and immunosuppressive drugs, initial encounter: Secondary | ICD-10-CM

## 2013-05-23 DIAGNOSIS — D6481 Anemia due to antineoplastic chemotherapy: Secondary | ICD-10-CM

## 2013-05-23 DIAGNOSIS — C49A0 Gastrointestinal stromal tumor, unspecified site: Secondary | ICD-10-CM

## 2013-05-23 DIAGNOSIS — C169 Malignant neoplasm of stomach, unspecified: Secondary | ICD-10-CM

## 2013-05-23 DIAGNOSIS — D630 Anemia in neoplastic disease: Secondary | ICD-10-CM

## 2013-05-23 DIAGNOSIS — G35 Multiple sclerosis: Secondary | ICD-10-CM

## 2013-05-23 DIAGNOSIS — D702 Other drug-induced agranulocytosis: Secondary | ICD-10-CM

## 2013-05-23 LAB — CBC WITH DIFFERENTIAL/PLATELET
BASO%: 0.8 % (ref 0.0–2.0)
Basophils Absolute: 0 10*3/uL (ref 0.0–0.1)
EOS ABS: 0.1 10*3/uL (ref 0.0–0.5)
EOS%: 2.1 % (ref 0.0–7.0)
HEMATOCRIT: 33.8 % — AB (ref 34.8–46.6)
HEMOGLOBIN: 11 g/dL — AB (ref 11.6–15.9)
LYMPH#: 1 10*3/uL (ref 0.9–3.3)
LYMPH%: 34.6 % (ref 14.0–49.7)
MCH: 30.6 pg (ref 25.1–34.0)
MCHC: 32.5 g/dL (ref 31.5–36.0)
MCV: 94.4 fL (ref 79.5–101.0)
MONO#: 0.3 10*3/uL (ref 0.1–0.9)
MONO%: 10.6 % (ref 0.0–14.0)
NEUT%: 51.9 % (ref 38.4–76.8)
NEUTROS ABS: 1.4 10*3/uL — AB (ref 1.5–6.5)
Platelets: 193 10*3/uL (ref 145–400)
RBC: 3.58 10*6/uL — ABNORMAL LOW (ref 3.70–5.45)
RDW: 13.6 % (ref 11.2–14.5)
WBC: 2.8 10*3/uL — AB (ref 3.9–10.3)

## 2013-05-23 NOTE — Progress Notes (Signed)
  [place] OFFICE PROGRESS NOTE   Diagnosis:  GIST  INTERVAL HISTORY:   Monica [name] returns as scheduled. She continues Gleevec 200 mg daily. She had several episodes of nausea/vomiting in a 24-hour period since her last visit. None since. She has periodic loose stools. No mouth sores. No skin rash. She denies shortness of breath. No leg swelling.  Objective:  Vital signs in last 24 hours:  Blood pressure 142/98, pulse 60, temperature 98.1 F (36.7 C), temperature source Oral, resp. rate 20, height 5\' 8"  (1.727 m), weight 206 lb 14.4 oz (93.849 kg), SpO2 100.00%.    HEENT: No thrush or ulcerations. Resp: Lungs clear. Cardio: Regular cardiac rhythm. GI: Abdomen soft and nontender. No hepatomegaly. No mass. Vascular: No leg edema.  Skin: Faint, fine rash at the upper chest.    Lab Results:  Lab Results  Component Value Date   WBC 2.8* 05/23/2013   HGB 11.0* 05/23/2013   HCT 33.8* 05/23/2013   MCV 94.4 05/23/2013   PLT 193 05/23/2013   NEUTROABS 1.4* 05/23/2013    Imaging:  No results found.  Medications: I have reviewed the patient's current medications.  Assessment/Plan: 1. Gastrointestinal stromal tumor of the stomach, high grade, stage IIIB (T4 NX), ruptured tumor, status post partial gastrectomy 09/07/2012. She began adjuvant Gleevec 10/19/2012 with a three-year course planned. 2. Multiple sclerosis followed by Dr. Jannifer Franklin. 3. Anemia secondary to blood loss from #1 and Gleevec. 4. Hair loss. Question related to [place]. 5. 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   Disposition: She appears stable. White count is better. She will continue Gleevec 200 mg daily. She will return for a followup CBC in 2 weeks and an office visit in 4 weeks.    [place] ANP/GNP-BC   05/23/2013  2:52 PM

## 2013-05-24 ENCOUNTER — Telehealth: Payer: Self-pay | Admitting: Oncology

## 2013-05-24 NOTE — Telephone Encounter (Signed)
s.w.l pt and advised on May and June appt...pt ok adn aware

## 2013-05-27 ENCOUNTER — Other Ambulatory Visit: Payer: Self-pay | Admitting: *Deleted

## 2013-05-27 MED ORDER — IMATINIB MESYLATE 100 MG PO TABS: 200.0000 mg | ORAL_TABLET | Freq: Every day | ORAL | Status: DC

## 2013-06-07 ENCOUNTER — Encounter: Payer: Self-pay | Admitting: *Deleted

## 2013-06-07 ENCOUNTER — Other Ambulatory Visit (HOSPITAL_BASED_OUTPATIENT_CLINIC_OR_DEPARTMENT_OTHER): Payer: Medicaid Other

## 2013-06-07 DIAGNOSIS — C49A0 Gastrointestinal stromal tumor, unspecified site: Secondary | ICD-10-CM

## 2013-06-07 DIAGNOSIS — C169 Malignant neoplasm of stomach, unspecified: Secondary | ICD-10-CM

## 2013-06-07 LAB — CBC WITH DIFFERENTIAL/PLATELET
BASO%: 0.7 % (ref 0.0–2.0)
BASOS ABS: 0 10*3/uL (ref 0.0–0.1)
EOS%: 2.6 % (ref 0.0–7.0)
Eosinophils Absolute: 0.1 10*3/uL (ref 0.0–0.5)
HEMATOCRIT: 35.2 % (ref 34.8–46.6)
HEMOGLOBIN: 11.3 g/dL — AB (ref 11.6–15.9)
LYMPH#: 0.8 10*3/uL — AB (ref 0.9–3.3)
LYMPH%: 32.9 % (ref 14.0–49.7)
MCH: 30.5 pg (ref 25.1–34.0)
MCHC: 32.1 g/dL (ref 31.5–36.0)
MCV: 95.2 fL (ref 79.5–101.0)
MONO#: 0.3 10*3/uL (ref 0.1–0.9)
MONO%: 10.7 % (ref 0.0–14.0)
NEUT%: 53.1 % (ref 38.4–76.8)
NEUTROS ABS: 1.3 10*3/uL — AB (ref 1.5–6.5)
Platelets: 212 10*3/uL (ref 145–400)
RBC: 3.69 10*6/uL — ABNORMAL LOW (ref 3.70–5.45)
RDW: 13.5 % (ref 11.2–14.5)
WBC: 2.5 10*3/uL — AB (ref 3.9–10.3)

## 2013-06-08 ENCOUNTER — Ambulatory Visit: Payer: Self-pay | Admitting: Adult Health

## 2013-06-08 ENCOUNTER — Telehealth: Payer: Self-pay | Admitting: *Deleted

## 2013-06-08 NOTE — Telephone Encounter (Signed)
Called and informed patient to continue taking gleevec at 200mg  daily and to follow up as scheduled. Per Dr. Benay Spice. Patient verbalized understanding.

## 2013-06-08 NOTE — Telephone Encounter (Signed)
Message copied by Norma Fredrickson on Wed Jun 08, 2013  2:07 PM ------      Message from: Brien Few      Created: Wed Jun 08, 2013  1:00 PM                   ----- Message -----         From: Ladell Pier, MD         Sent: 06/07/2013   9:38 PM           To: Tania Ade, RN, Ludwig Lean, RN, #            Please call patient, continue gleevec at 200mg  daily, f/u as scheduled ------

## 2013-06-09 ENCOUNTER — Other Ambulatory Visit: Payer: Self-pay | Admitting: *Deleted

## 2013-06-13 ENCOUNTER — Telehealth: Payer: Self-pay | Admitting: *Deleted

## 2013-06-13 NOTE — Telephone Encounter (Signed)
Scheduler called to report patient only wants to see Dr. Benay Spice on 06/20/13. Feels she has seen midlevel enough. MD notified of her request.

## 2013-06-14 ENCOUNTER — Telehealth: Payer: Self-pay | Admitting: *Deleted

## 2013-06-14 NOTE — Telephone Encounter (Signed)
Returned call to pt, she will see Dr. Benay Spice 6/8 with lab prior to appt. She voiced understanding.

## 2013-06-20 ENCOUNTER — Telehealth: Payer: Self-pay | Admitting: Neurology

## 2013-06-20 ENCOUNTER — Other Ambulatory Visit (HOSPITAL_BASED_OUTPATIENT_CLINIC_OR_DEPARTMENT_OTHER): Payer: Medicaid Other

## 2013-06-20 ENCOUNTER — Ambulatory Visit: Payer: Medicaid Other | Admitting: Oncology

## 2013-06-20 ENCOUNTER — Other Ambulatory Visit: Payer: Medicaid Other

## 2013-06-20 ENCOUNTER — Ambulatory Visit (HOSPITAL_BASED_OUTPATIENT_CLINIC_OR_DEPARTMENT_OTHER): Payer: Medicaid Other | Admitting: Oncology

## 2013-06-20 VITALS — BP 147/82 | HR 59 | Temp 99.4°F | Resp 18 | Ht 68.0 in | Wt 211.2 lb

## 2013-06-20 DIAGNOSIS — D630 Anemia in neoplastic disease: Secondary | ICD-10-CM

## 2013-06-20 DIAGNOSIS — C49A0 Gastrointestinal stromal tumor, unspecified site: Secondary | ICD-10-CM

## 2013-06-20 DIAGNOSIS — C169 Malignant neoplasm of stomach, unspecified: Secondary | ICD-10-CM

## 2013-06-20 DIAGNOSIS — D702 Other drug-induced agranulocytosis: Secondary | ICD-10-CM

## 2013-06-20 LAB — CBC WITH DIFFERENTIAL/PLATELET
BASO%: 0.6 % (ref 0.0–2.0)
Basophils Absolute: 0 10*3/uL (ref 0.0–0.1)
EOS%: 2.8 % (ref 0.0–7.0)
Eosinophils Absolute: 0.1 10*3/uL (ref 0.0–0.5)
HEMATOCRIT: 32.1 % — AB (ref 34.8–46.6)
HEMOGLOBIN: 10.4 g/dL — AB (ref 11.6–15.9)
LYMPH#: 0.9 10*3/uL (ref 0.9–3.3)
LYMPH%: 35.3 % (ref 14.0–49.7)
MCH: 30.8 pg (ref 25.1–34.0)
MCHC: 32.5 g/dL (ref 31.5–36.0)
MCV: 94.8 fL (ref 79.5–101.0)
MONO#: 0.2 10*3/uL (ref 0.1–0.9)
MONO%: 9.4 % (ref 0.0–14.0)
NEUT#: 1.3 10*3/uL — ABNORMAL LOW (ref 1.5–6.5)
NEUT%: 51.9 % (ref 38.4–76.8)
Platelets: 183 10*3/uL (ref 145–400)
RBC: 3.38 10*6/uL — ABNORMAL LOW (ref 3.70–5.45)
RDW: 13.9 % (ref 11.2–14.5)
WBC: 2.5 10*3/uL — ABNORMAL LOW (ref 3.9–10.3)

## 2013-06-20 LAB — COMPREHENSIVE METABOLIC PANEL (CC13)
ALT: 6 U/L (ref 0–55)
AST: 10 U/L (ref 5–34)
Albumin: 3.9 g/dL (ref 3.5–5.0)
Alkaline Phosphatase: 46 U/L (ref 40–150)
Anion Gap: 7 mEq/L (ref 3–11)
BUN: 11.8 mg/dL (ref 7.0–26.0)
CALCIUM: 8.9 mg/dL (ref 8.4–10.4)
CHLORIDE: 108 meq/L (ref 98–109)
CO2: 24 mEq/L (ref 22–29)
Creatinine: 1 mg/dL (ref 0.6–1.1)
Glucose: 87 mg/dl (ref 70–140)
Potassium: 3.7 mEq/L (ref 3.5–5.1)
Sodium: 139 mEq/L (ref 136–145)
Total Bilirubin: 0.42 mg/dL (ref 0.20–1.20)
Total Protein: 6.7 g/dL (ref 6.4–8.3)

## 2013-06-20 NOTE — Telephone Encounter (Signed)
Patient called to make Dr. Jannifer Franklin aware that she will be looking for another provider. She would not give a reason when asked.

## 2013-06-20 NOTE — Progress Notes (Signed)
  [place] OFFICE PROGRESS NOTE   Diagnosis: Gastrointestinal stromal tumor  INTERVAL HISTORY:   She returns as scheduled. She continues Gleevec at a dose of 200 mg daily. No nausea, rash, or swelling. She reports once daily diarrhea. No other complaint. She has decided to discontinue Gleevec.  Objective:  Vital signs in last 24 hours:  Blood pressure 147/82, pulse 59, temperature 99.4 F (37.4 C), temperature source Oral, resp. rate 18, height 5\' 8"  (1.727 m), weight 211 lb 3.2 oz (95.8 kg).    HEENT: No thrush or ulcers Lymphatics: No cervical, supra-clavicular, axillary, or inguinal nodes Resp: Lungs clear bilaterally Cardio: Regular rate and rhythm GI: Nontender, no hepatomegaly, no apparent ascites, no mass Vascular: No leg edema  Skin:? Faint fine rash over the back    Lab Results:  Lab Results  Component Value Date   WBC 2.5* 06/20/2013   HGB 10.4* 06/20/2013   HCT 32.1* 06/20/2013   MCV 94.8 06/20/2013   PLT 183 06/20/2013   NEUTROABS 1.3* 06/20/2013    Medications: I have reviewed the patient's current medications.  Assessment/Plan: 1. Gastrointestinal stromal tumor of the stomach, high grade, stage IIIB (T4 NX), ruptured tumor, status post partial gastrectomy 09/07/2012. She began adjuvant Gleevec 10/19/2012 with a three-year course planned. 2. Multiple sclerosis followed by Dr. Jannifer Franklin. 3. Anemia secondary to blood loss from #1 and Gleevec 4. Hair loss. Question related to Jay. She denied hair loss today. 5. Neutropenia secondary to Gleevec-improved    Disposition:  She appears stable. She has decided against further [place] therapy. I explained the rationale for adjuvant Gleevec in her case. She understands the high risk of developing recurrent disease. Ms. Guerrieri indicated repeatedly that she will not agree to further Gleevec.  She will return for an office and lab visit in 3 months. She will contact us in the interim for new symptoms. I  encouraged her to followup with Dr. Jannifer Franklin for the multiple sclerosis.  Ladell Pier, MD  06/20/2013  3:56 PM

## 2013-06-21 ENCOUNTER — Telehealth: Payer: Self-pay | Admitting: Oncology

## 2013-06-21 NOTE — Telephone Encounter (Signed)
Spoke with patient and she said that she will be going to another provider outside of our practice

## 2013-06-21 NOTE — Telephone Encounter (Signed)
lvm for pt regarding to SE[facility] appt...mailed pt appt sched/avs and letter °

## 2013-06-21 NOTE — Telephone Encounter (Signed)
Events noted. The patient was acting in an unusual fashion in December 2014. She showed up for her visit, but would not wait long enough for the medical evaluation, left the exam room. No further refills on her Copaxone.

## 2013-07-21 ENCOUNTER — Encounter: Payer: Self-pay | Admitting: *Deleted

## 2013-07-27 ENCOUNTER — Encounter: Payer: Self-pay | Admitting: Neurology

## 2013-07-27 ENCOUNTER — Ambulatory Visit (INDEPENDENT_AMBULATORY_CARE_PROVIDER_SITE_OTHER): Payer: Medicaid Other | Admitting: Neurology

## 2013-07-27 VITALS — BP 118/82 | HR 69 | Resp 16 | Ht 68.0 in | Wt 216.0 lb

## 2013-07-27 DIAGNOSIS — G35 Multiple sclerosis: Secondary | ICD-10-CM

## 2013-07-27 MED ORDER — GLATIRAMER ACETATE 20 MG/ML ~~LOC~~ SOSY: 20.0000 mg | PREFILLED_SYRINGE | Freq: Every day | SUBCUTANEOUS | Status: DC

## 2013-07-27 NOTE — Progress Notes (Signed)
NEUROLOGY CONSULTATION NOTE  Monica [name] MRN: [id number] DOB: 1965/08/03  Referring provider: Dr. Vista Lawman Primary care provider: Dr. Vista Lawman  Reason for consult:  MS  HISTORY OF PRESENT ILLNESS: Monica [name] is a 48 year old right-handed AA woman with history of gastrointestinal stromal tumor of the stomach status post partial gastrectomy and previously on Gleevac, MS, hypertension, hyperlipidemia, low back pain, mass and vitamin D deficiency who presents to establish care regarding MS.  Limited records reviewed.  In 2008, she had optic neuritis of the left eye, presenting as vision loss, as well as nausea, gait instability and stiffness of the right hand.  She was admitted to the hospital at [facility], where she had MRI and underwent LP.  She was treated with IV steroids and started on Copaxone.  She has done well since that time.  She has no residual deficits.  She has had no other flare-ups.  She urinates frequently, but it seems to be due to keeping hydrated rather than an overactive bladder.  She feels tired at times, but no extreme fatigue.  She has right lumbar radicular pain, which is well-controlled.  She reports some memory problems, namely misplacing items such as her phone, or needing to write down appointments, but she has no signs suggesting cognitive impairment.  She does not drive.  She is currently unemployed.  Current medications:  Copaxone, diclofenac sodium 75mg  for back and leg pain, nortriptyline 10mg  (takes infrequently)  Past medications:  Gabapentin (she is not aware she was on this).  07/22/11 MRI BRAIN W WO:  multiple supratentorial and infratentorial hyperintensities with no abnormal post-contrast enhancement.  06/20/13 LABS:  WBC 2.5, HGB 10.4, HCT 32.1, PLT 183, 51.9% neut, 35.3% lymphs, Na 139, K 3.7, Cl 108, BUN 11.8, Cr 1.0, TB 0.42, AP 46, AST 10, ALT 6  PAST MEDICAL HISTORY: Past Medical History  Diagnosis Date  . Hypertension   . Hyperlipidemia   . MS  (multiple sclerosis)   . Low back pain   . Dyslipidemia   . Vitamin D deficiency     PAST SURGICAL HISTORY: Past Surgical History  Procedure Laterality Date  . Esophagogastroduodenoscopy N/A 09/04/2012    Procedure: ESOPHAGOGASTRODUODENOSCOPY (EGD);  Surgeon: Jerene Bears, MD;  Location: [facility];  Service: Gastroenterology;  Laterality: N/A;  . Laparotomy N/A 09/07/2012    Procedure: EXPLORATORY LAPAROTOMY;  Surgeon: Leighton Ruff, MD;  Location: [facility];  Service: General;  Laterality: N/A;  . Partial gastrectomy N/A 09/07/2012    Procedure: PARTIAL GASTRECTOMY/RESECTION OF ABDOMINAL MASS;  Surgeon: Leighton Ruff, MD;  Location: [facility];  Service: General;  Laterality: N/A;    MEDICATIONS: Current Outpatient Prescriptions on File Prior to Visit  Medication Sig Dispense Refill  . ferrous sulfate (FERROUSUL) 325 (65 FE) MG tablet Take 1 tablet (325 mg total) by mouth daily with breakfast.  30 tablet  0  . lisinopril (PRINIVIL,ZESTRIL) 10 MG tablet Take 10 mg by mouth daily.      . simvastatin (ZOCOR) 10 MG tablet Take 10 mg by mouth daily.      . prochlorperazine (COMPAZINE) 10 MG tablet Take 1 tablet (10 mg total) by mouth every 6 (six) hours as needed for nausea or vomiting.  30 tablet  1   No current facility-administered medications on file prior to visit.    ALLERGIES: No Known Allergies  FAMILY HISTORY: Family History  Problem Relation Age of Onset  . Stroke Mother   . Stroke Father   . Schizophrenia Brother   .  Hypertension Brother   . Hypertension Brother     SOCIAL HISTORY: History   Social History  . Marital Status: Single    Spouse Name: N/A    Number of Children: 1  . Years of Education: hs   Occupational History  . Not on file.   Social History Main Topics  . Smoking status: Former Research scientist (life sciences)  . Smokeless tobacco: Never Used  . Alcohol Use: No  . Drug Use: No  . Sexual Activity: Not on file   Other Topics Concern  . Not on file   Social History  Narrative   Patient is single and lives with son.   Patient son has educational needs.    Patient does not work.    Patient has a high school education    Patient quit smoking in 2005.   Patient also quit drinking.            REVIEW OF SYSTEMS: Constitutional: No fevers, chills, or sweats, no generalized fatigue, change in appetite Eyes: No visual changes, double vision, eye pain Ear, nose and throat: No hearing loss, ear pain, nasal congestion, sore throat Cardiovascular: No chest pain, palpitations Respiratory:  No shortness of breath at rest or with exertion, wheezes GastrointestinaI: No nausea, vomiting, diarrhea, abdominal pain, fecal incontinence Genitourinary:  No dysuria, urinary retention or frequency Musculoskeletal:  No neck pain, back pain Integumentary: No rash, pruritus, skin lesions Neurological: as above Psychiatric: No depression, insomnia, anxiety Endocrine: No palpitations, fatigue, diaphoresis, mood swings, change in appetite, change in weight, increased thirst Hematologic/Lymphatic:  No anemia, purpura, petechiae. Allergic/Immunologic: no itchy/runny eyes, nasal congestion, recent allergic reactions, rashes  PHYSICAL EXAM: Filed Vitals:   07/27/13 1418  BP: 118/82  Pulse: 69  Resp: 16   General: No acute distress Head:  Normocephalic/atraumatic Neck: supple, no paraspinal tenderness, full range of motion Back: No paraspinal tenderness Heart: regular rate and rhythm Lungs: Clear to auscultation bilaterally. Vascular: No carotid bruits. Neurological Exam: Mental status: alert and oriented to person, place, and time, recent and remote memory intact, fund of knowledge intact, attention and concentration intact, speech fluent and not dysarthric, language intact. Cranial nerves: CN I: not tested CN II: pupils equal, round and reactive to light, visual fields intact, fundi unremarkable, without vessel changes, exudates, hemorrhages or papilledema. CN III,  IV, VI:  full range of motion, no nystagmus, no ptosis CN V: facial sensation intact CN VII: upper and lower face symmetric CN VIII: hearing intact CN IX, X: gag intact, uvula midline CN XI: sternocleidomastoid and trapezius muscles intact CN XII: tongue midline Bulk & Tone: normal, no fasciculations. Motor: 5 out of 5 throughout Sensation: Temperature and vibration intact Deep Tendon Reflexes: 2+ throughout, except slightly more brisk in the right patellar. Plantar flexor on the left, questionable on the right. Finger to nose testing: No dysmetria Heel to shin: No dysmetria Gait: Normal station and stride. Able to turn, walk on toes, walk on heels, and in tandem. Romberg negative.  IMPRESSION: Multiple sclerosis, stable.  PLAN: 1. Will prescribe refills of Copaxone 20 mg subcutaneous daily. She ran out and has missed a couple of doses. 2. Order followup MRI of the brain with and without contrast 3. Check vitamin D level 4. Followup in 6 months or as needed.  Thank you for allowing me to take part in the care of this patient.  Metta Clines, DO  CC:  Benito Mccreedy, MD

## 2013-07-27 NOTE — Patient Instructions (Signed)
1.  We will refill your Copaxone 2.  We will check a vitamin D level 3.  We will repeat MRI of the brain  4.  Follow up in 6 months.

## 2013-07-28 ENCOUNTER — Telehealth: Payer: Self-pay | Admitting: Neurology

## 2013-07-28 ENCOUNTER — Telehealth: Payer: Self-pay | Admitting: *Deleted

## 2013-07-28 LAB — VITAMIN D 25 HYDROXY (VIT D DEFICIENCY, FRACTURES): Vit D, 25-Hydroxy: 34 ng/mL (ref 30–89)

## 2013-07-28 NOTE — Telephone Encounter (Signed)
Pt needs to talk to someone about some forms to be filled out about medication please call [phone]

## 2013-07-28 NOTE — Telephone Encounter (Signed)
Patient is aware that b12  Is low and advised to take a D3  D3 2000 IU daily

## 2013-07-28 NOTE — Telephone Encounter (Signed)
Patient will have pharmacy to fax form to office to fill out for copaxone

## 2013-07-28 NOTE — Telephone Encounter (Signed)
Message copied by Claudie Revering on Thu Jul 28, 2013  8:31 AM ------      Message from: JAFFE, ADAM R      Created: Thu Jul 28, 2013  6:04 AM       b12 is in the low-normal range at 34.  I would like it above 50.  Recommending taking D3 2000 IU daily      ----- Message -----         From: Lab in [facility] Interface         Sent: 07/28/2013   1:30 AM           To: Dudley Major, DO                   ------

## 2013-08-16 ENCOUNTER — Ambulatory Visit (HOSPITAL_COMMUNITY): Admission: RE | Admit: 2013-08-16 | Payer: Medicaid Other | Source: Ambulatory Visit

## 2013-08-22 ENCOUNTER — Other Ambulatory Visit (HOSPITAL_COMMUNITY): Payer: Medicaid Other

## 2013-08-22 ENCOUNTER — Encounter (INDEPENDENT_AMBULATORY_CARE_PROVIDER_SITE_OTHER): Payer: Self-pay

## 2013-08-22 ENCOUNTER — Ambulatory Visit (HOSPITAL_COMMUNITY)
Admission: RE | Admit: 2013-08-22 | Discharge: 2013-08-22 | Disposition: A | Discharge status: Home / Self Care | Payer: Medicaid Other | Source: Ambulatory Visit | Attending: Neurology | Admitting: Neurology

## 2013-08-22 DIAGNOSIS — G35 Multiple sclerosis: Secondary | ICD-10-CM | POA: Diagnosis not present

## 2013-08-22 LAB — CREATININE, SERUM
CREATININE: 0.97 mg/dL (ref 0.50–1.10)
GFR calc Af Amer: 80 mL/min — ABNORMAL LOW (ref >90)
GFR calc non Af Amer: 69 mL/min — ABNORMAL LOW (ref >90)

## 2013-08-22 MED ORDER — GADOBENATE DIMEGLUMINE 529 MG/ML IV SOLN
20.0000 mL | Freq: Once | INTRAVENOUS | Status: AC | PRN
  Administered 2013-08-22: 20 mL via INTRAVENOUS

## 2013-08-29 ENCOUNTER — Telehealth: Payer: Self-pay | Admitting: *Deleted

## 2013-08-29 NOTE — Telephone Encounter (Signed)
Error

## 2013-09-22 ENCOUNTER — Other Ambulatory Visit (HOSPITAL_BASED_OUTPATIENT_CLINIC_OR_DEPARTMENT_OTHER): Payer: Medicaid Other

## 2013-09-22 ENCOUNTER — Ambulatory Visit (HOSPITAL_BASED_OUTPATIENT_CLINIC_OR_DEPARTMENT_OTHER): Payer: Medicaid Other | Admitting: Oncology

## 2013-09-22 VITALS — BP 137/91 | HR 64 | Temp 98.7°F | Resp 18 | Ht 68.0 in | Wt 218.8 lb

## 2013-09-22 DIAGNOSIS — C494 Malignant neoplasm of connective and soft tissue of abdomen: Secondary | ICD-10-CM

## 2013-09-22 DIAGNOSIS — D500 Iron deficiency anemia secondary to blood loss (chronic): Secondary | ICD-10-CM

## 2013-09-22 DIAGNOSIS — C49A0 Gastrointestinal stromal tumor, unspecified site: Secondary | ICD-10-CM

## 2013-09-22 LAB — CBC WITH DIFFERENTIAL/PLATELET
BASO%: 0.9 % (ref 0.0–2.0)
BASOS ABS: 0 10*3/uL (ref 0.0–0.1)
EOS ABS: 0.1 10*3/uL (ref 0.0–0.5)
EOS%: 3.3 % (ref 0.0–7.0)
HEMATOCRIT: 34.5 % — AB (ref 34.8–46.6)
HGB: 11.3 g/dL — ABNORMAL LOW (ref 11.6–15.9)
LYMPH%: 30.7 % (ref 14.0–49.7)
MCH: 30.4 pg (ref 25.1–34.0)
MCHC: 32.6 g/dL (ref 31.5–36.0)
MCV: 93.1 fL (ref 79.5–101.0)
MONO#: 0.4 10*3/uL (ref 0.1–0.9)
MONO%: 11.3 % (ref 0.0–14.0)
NEUT%: 53.8 % (ref 38.4–76.8)
NEUTROS ABS: 2 10*3/uL (ref 1.5–6.5)
PLATELETS: 251 10*3/uL (ref 145–400)
RBC: 3.71 10*6/uL (ref 3.70–5.45)
RDW: 12.9 % (ref 11.2–14.5)
WBC: 3.7 10*3/uL — ABNORMAL LOW (ref 3.9–10.3)
lymph#: 1.1 10*3/uL (ref 0.9–3.3)

## 2013-09-22 NOTE — Progress Notes (Signed)
  [place] OFFICE PROGRESS NOTE   Diagnosis: Gastrointestinal stromal tumor  INTERVAL HISTORY:   She returns as scheduled. She is no longer taking Gleevec. She reports feeling better and having less diarrhea since discontinuing Gleevec. He recently had a "cold ". She reports being evaluated by her primary physician including a chest x-ray. She was prescribed an antibiotic. She had one episode of nausea and vomiting while taking the antibiotic. No nominal pain or nausea at present.  Objective:  Vital signs in last 24 hours:  Blood pressure 137/91, pulse 64, temperature 98.7 F (37.1 C), temperature source Oral, resp. rate 18, height 5\' 8"  (1.727 m), weight 218 lb 12.8 oz (99.247 kg), SpO2 98.00%.     Lymphatics: No cervical or supraclavicular nodes Resp: Lungs clear bilaterally, no respiratory distress Cardio: Regular rate and rhythm GI: No hepatosplenomegaly, nontender, no mass Vascular: No leg edema   Lab Results:  Lab Results  Component Value Date   WBC 3.7* 09/22/2013   HGB 11.3* 09/22/2013   HCT 34.5* 09/22/2013   MCV 93.1 09/22/2013   PLT 251 09/22/2013   NEUTROABS 2.0 09/22/2013     Medications: I have reviewed the patient's current medications.  Assessment/Plan: 1. Gastrointestinal stromal tumor of the stomach, high grade, stage IIIB (T4 NX), ruptured tumor, status post partial gastrectomy 09/07/2012. She began adjuvant Gleevec 10/19/2012 with a three-year course planned.  She discontinued Gleevec 06/20/2013 2. Multiple sclerosis followed by Dr.Jaffe 3. Anemia secondary to blood loss from #1 and Gleevec, improved  Disposition:  She remains in clinical remission from the gastrointestinal stromal tumor. She will return for an office visit in 3 months. Ms. Fountaine will contact us in the interim for new symptoms.  Betsy Coder, MD  09/22/2013  3:51 PM

## 2013-09-23 ENCOUNTER — Telehealth: Payer: Self-pay | Admitting: Oncology

## 2013-09-23 NOTE — Telephone Encounter (Signed)
lvm for pt regarding to Dec appt....mailed pt appt sched/avs and letter °

## 2013-12-20 ENCOUNTER — Ambulatory Visit: Payer: Medicaid Other | Admitting: Nurse Practitioner

## 2014-01-05 ENCOUNTER — Telehealth: Payer: Self-pay | Admitting: Neurology

## 2014-01-05 NOTE — Telephone Encounter (Signed)
Pt returned Seth Bake call about resch appt from 01-27-14 she just canceled appt and wil call back to resch

## 2014-01-09 ENCOUNTER — Inpatient Hospital Stay (HOSPITAL_COMMUNITY)
Admission: EM | Admit: 2014-01-09 | Discharge: 2014-01-12 | DRG: 542 | Disposition: A | Discharge status: Home / Self Care | Payer: Medicaid Other | Attending: Internal Medicine | Admitting: Internal Medicine

## 2014-01-09 ENCOUNTER — Emergency Department (HOSPITAL_COMMUNITY): Payer: Medicaid Other

## 2014-01-09 ENCOUNTER — Encounter (HOSPITAL_COMMUNITY): Payer: Self-pay

## 2014-01-09 DIAGNOSIS — E43 Unspecified severe protein-calorie malnutrition: Secondary | ICD-10-CM | POA: Diagnosis present

## 2014-01-09 DIAGNOSIS — K625 Hemorrhage of anus and rectum: Secondary | ICD-10-CM

## 2014-01-09 DIAGNOSIS — N390 Urinary tract infection, site not specified: Secondary | ICD-10-CM | POA: Insufficient documentation

## 2014-01-09 DIAGNOSIS — C494 Malignant neoplasm of connective and soft tissue of abdomen: Principal | ICD-10-CM | POA: Diagnosis present

## 2014-01-09 DIAGNOSIS — R140 Abdominal distension (gaseous): Secondary | ICD-10-CM | POA: Diagnosis present

## 2014-01-09 DIAGNOSIS — E860 Dehydration: Secondary | ICD-10-CM | POA: Diagnosis present

## 2014-01-09 DIAGNOSIS — C49A0 Gastrointestinal stromal tumor, unspecified site: Secondary | ICD-10-CM | POA: Diagnosis present

## 2014-01-09 DIAGNOSIS — C169 Malignant neoplasm of stomach, unspecified: Secondary | ICD-10-CM | POA: Diagnosis present

## 2014-01-09 DIAGNOSIS — Z87891 Personal history of nicotine dependence: Secondary | ICD-10-CM

## 2014-01-09 DIAGNOSIS — D638 Anemia in other chronic diseases classified elsewhere: Secondary | ICD-10-CM | POA: Diagnosis present

## 2014-01-09 DIAGNOSIS — K921 Melena: Secondary | ICD-10-CM

## 2014-01-09 DIAGNOSIS — K5900 Constipation, unspecified: Secondary | ICD-10-CM | POA: Diagnosis present

## 2014-01-09 DIAGNOSIS — K922 Gastrointestinal hemorrhage, unspecified: Secondary | ICD-10-CM | POA: Diagnosis present

## 2014-01-09 DIAGNOSIS — D62 Acute posthemorrhagic anemia: Secondary | ICD-10-CM | POA: Diagnosis present

## 2014-01-09 DIAGNOSIS — I10 Essential (primary) hypertension: Secondary | ICD-10-CM | POA: Diagnosis present

## 2014-01-09 DIAGNOSIS — E785 Hyperlipidemia, unspecified: Secondary | ICD-10-CM | POA: Diagnosis present

## 2014-01-09 DIAGNOSIS — D709 Neutropenia, unspecified: Secondary | ICD-10-CM | POA: Diagnosis present

## 2014-01-09 DIAGNOSIS — D649 Anemia, unspecified: Secondary | ICD-10-CM

## 2014-01-09 DIAGNOSIS — Z903 Acquired absence of stomach [part of]: Secondary | ICD-10-CM | POA: Diagnosis present

## 2014-01-09 DIAGNOSIS — Z6832 Body mass index (BMI) 32.0-32.9, adult: Secondary | ICD-10-CM | POA: Diagnosis not present

## 2014-01-09 DIAGNOSIS — N19 Unspecified kidney failure: Secondary | ICD-10-CM

## 2014-01-09 DIAGNOSIS — G35 Multiple sclerosis: Secondary | ICD-10-CM | POA: Diagnosis present

## 2014-01-09 DIAGNOSIS — N179 Acute kidney failure, unspecified: Secondary | ICD-10-CM | POA: Diagnosis present

## 2014-01-09 HISTORY — DX: Malignant (primary) neoplasm, unspecified: C80.1

## 2014-01-09 LAB — URINALYSIS, ROUTINE W REFLEX MICROSCOPIC
Glucose, UA: NEGATIVE mg/dL
KETONES UR: 15 mg/dL — AB
NITRITE: NEGATIVE
PH: 5 (ref 5.0–8.0)
Protein, ur: 100 mg/dL — AB
Specific Gravity, Urine: 1.025 (ref 1.005–1.030)
Urobilinogen, UA: 1 mg/dL (ref 0.0–1.0)

## 2014-01-09 LAB — COMPREHENSIVE METABOLIC PANEL
ALBUMIN: 3 g/dL — AB (ref 3.5–5.2)
ALT: 7 U/L (ref 0–35)
ANION GAP: 12 (ref 5–15)
AST: 16 U/L (ref 0–37)
Alkaline Phosphatase: 89 U/L (ref 39–117)
BUN: 26 mg/dL — AB (ref 6–23)
CALCIUM: 9.3 mg/dL (ref 8.4–10.5)
CHLORIDE: 99 meq/L (ref 96–112)
CO2: 23 mmol/L (ref 19–32)
CREATININE: 3.15 mg/dL — AB (ref 0.50–1.10)
GFR calc Af Amer: 19 mL/min — ABNORMAL LOW (ref >90)
GFR calc non Af Amer: 16 mL/min — ABNORMAL LOW (ref >90)
Glucose, Bld: 91 mg/dL (ref 70–99)
Potassium: 3.9 mmol/L (ref 3.5–5.1)
Sodium: 134 mmol/L — ABNORMAL LOW (ref 135–145)
TOTAL PROTEIN: 7.5 g/dL (ref 6.0–8.3)
Total Bilirubin: 0.6 mg/dL (ref 0.3–1.2)

## 2014-01-09 LAB — CBC WITH DIFFERENTIAL/PLATELET
Basophils Absolute: 0 10*3/uL (ref 0.0–0.1)
Basophils Relative: 0 % (ref 0–1)
Eosinophils Absolute: 0.1 10*3/uL (ref 0.0–0.7)
Eosinophils Relative: 1 % (ref 0–5)
HEMATOCRIT: 22.1 % — AB (ref 36.0–46.0)
Hemoglobin: 6.8 g/dL — CL (ref 12.0–15.0)
LYMPHS ABS: 0.8 10*3/uL (ref 0.7–4.0)
LYMPHS PCT: 8 % — AB (ref 12–46)
MCH: 26.7 pg (ref 26.0–34.0)
MCHC: 30.8 g/dL (ref 30.0–36.0)
MCV: 86.7 fL (ref 78.0–100.0)
MONOS PCT: 5 % (ref 3–12)
Monocytes Absolute: 0.5 10*3/uL (ref 0.1–1.0)
NEUTROS ABS: 7.7 10*3/uL (ref 1.7–7.7)
Neutrophils Relative %: 85 % — ABNORMAL HIGH (ref 43–77)
PLATELETS: 513 10*3/uL — AB (ref 150–400)
RBC: 2.55 MIL/uL — ABNORMAL LOW (ref 3.87–5.11)
RDW: 15.2 % (ref 11.5–15.5)
WBC: 9.1 10*3/uL (ref 4.0–10.5)

## 2014-01-09 LAB — URINE MICROSCOPIC-ADD ON

## 2014-01-09 LAB — MRSA PCR SCREENING: MRSA by PCR: NEGATIVE

## 2014-01-09 LAB — ABO/RH: ABO/RH(D): O POS

## 2014-01-09 LAB — PREPARE RBC (CROSSMATCH)

## 2014-01-09 MED ORDER — HYDROCORTISONE ACETATE 25 MG RE SUPP
25.0000 mg | Freq: Every day | RECTAL | Status: DC
  Administered 2014-01-09 – 2014-01-11 (×3): 25 mg via RECTAL
  Filled 2014-01-09 (×5): qty 1

## 2014-01-09 MED ORDER — SODIUM CHLORIDE 0.9 % IV SOLN
80.0000 mg | Freq: Once | INTRAVENOUS | Status: AC
  Administered 2014-01-09: 80 mg via INTRAVENOUS
  Filled 2014-01-09: qty 80

## 2014-01-09 MED ORDER — MORPHINE SULFATE 2 MG/ML IJ SOLN
1.0000 mg | INTRAMUSCULAR | Status: DC | PRN
  Administered 2014-01-09 – 2014-01-11 (×8): 1 mg via INTRAVENOUS
  Filled 2014-01-09 (×8): qty 1

## 2014-01-09 MED ORDER — SODIUM CHLORIDE 0.9 % IV SOLN
INTRAVENOUS | Status: DC
  Administered 2014-01-09: 18:00:00 via INTRAVENOUS
  Administered 2014-01-10: 1000 mL via INTRAVENOUS
  Administered 2014-01-10 – 2014-01-12 (×3): via INTRAVENOUS

## 2014-01-09 MED ORDER — ONDANSETRON HCL 4 MG PO TABS: 4.0000 mg | ORAL_TABLET | Freq: Four times a day (QID) | ORAL | Status: DC | PRN

## 2014-01-09 MED ORDER — PANTOPRAZOLE SODIUM 40 MG IV SOLR: 40.0000 mg | Freq: Two times a day (BID) | INTRAVENOUS | Status: DC

## 2014-01-09 MED ORDER — PROCHLORPERAZINE MALEATE 10 MG PO TABS
10.0000 mg | ORAL_TABLET | Freq: Four times a day (QID) | ORAL | Status: DC | PRN
  Filled 2014-01-09: qty 1

## 2014-01-09 MED ORDER — SODIUM CHLORIDE 0.9 % IV SOLN
10.0000 mL/h | Freq: Once | INTRAVENOUS | Status: AC
  Administered 2014-01-09: 10 mL/h via INTRAVENOUS

## 2014-01-09 MED ORDER — SODIUM CHLORIDE 0.9 % IV BOLUS (SEPSIS)
1000.0000 mL | Freq: Once | INTRAVENOUS | Status: AC
  Administered 2014-01-09: 1000 mL via INTRAVENOUS

## 2014-01-09 MED ORDER — IOHEXOL 300 MG/ML  SOLN: 50.0000 mL | Freq: Once | INTRAMUSCULAR | Status: AC | PRN

## 2014-01-09 MED ORDER — ONDANSETRON HCL 4 MG/2ML IJ SOLN: 4.0000 mg | Freq: Once | INTRAMUSCULAR | Status: DC

## 2014-01-09 MED ORDER — MAGNESIUM HYDROXIDE 400 MG/5ML PO SUSP: 30.0000 mL | Freq: Every day | ORAL | Status: DC | PRN

## 2014-01-09 MED ORDER — SODIUM CHLORIDE 0.9 % IV SOLN: INTRAVENOUS | Status: DC

## 2014-01-09 MED ORDER — ONDANSETRON HCL 4 MG/2ML IJ SOLN: 4.0000 mg | Freq: Four times a day (QID) | INTRAMUSCULAR | Status: DC | PRN

## 2014-01-09 MED ORDER — SODIUM CHLORIDE 0.9 % IV SOLN
8.0000 mg/h | INTRAVENOUS | Status: DC
  Administered 2014-01-09 – 2014-01-11 (×3): 8 mg/h via INTRAVENOUS
  Filled 2014-01-09 (×9): qty 80

## 2014-01-09 MED ORDER — ONDANSETRON HCL 4 MG/2ML IJ SOLN: 4.0000 mg | Freq: Three times a day (TID) | INTRAMUSCULAR | Status: AC | PRN

## 2014-01-09 MED ORDER — BOOST / RESOURCE BREEZE PO LIQD
1.0000 | Freq: Three times a day (TID) | ORAL | Status: DC
  Administered 2014-01-10 – 2014-01-12 (×2): 1 via ORAL

## 2014-01-09 MED ORDER — IOHEXOL 300 MG/ML  SOLN
50.0000 mL | Freq: Once | INTRAMUSCULAR | Status: AC | PRN
  Administered 2014-01-09: 50 mL via ORAL

## 2014-01-09 MED ORDER — DEXTROSE 5 % IV SOLN
1.0000 g | INTRAVENOUS | Status: DC
  Administered 2014-01-10: 1 g via INTRAVENOUS
  Filled 2014-01-09 (×2): qty 10

## 2014-01-09 MED ORDER — DEXTROSE 5 % IV SOLN
1.0000 g | Freq: Once | INTRAVENOUS | Status: AC
  Administered 2014-01-09: 1 g via INTRAVENOUS
  Filled 2014-01-09: qty 10

## 2014-01-09 NOTE — Consult Note (Signed)
Consultation  Referring Provider:  [facility]    Primary Care Physician:  Benito Mccreedy, MD Primary Gastroenterologist:  Zenovia Jarred       Reason for Consultation: GI bleed             HPI:   Monica [name] is a 48 y.o. female brought from our office to ED today for dizziness, hypotension. In ED her hgb is 6.8.   Patient diagnosed with high grade, stage IIIB (T4 NX) GIST August 2014. She underwent exploratory laparotomy / partial gastrectomy with resection of the mass. Patient followed by Dr. Learta Codding. She started [facility] October 2014, was supposed to take it for 3 years but stopped treatment June 2015 because of diarrhea. At her last visit with Oncology in Sept 2015 patient was felt to be in clinical remission. Then, in November her abdomen began to swell. She has been nauseated. Patient missed Oncology appointment in December.  Patient denies black stools even though she takes iron at home. She has been seeing some bright red blood on toilet tissue but she thinks is hemorrhoid related. Patient endorse nausea and vomiting but no hematemesis, mainly just mucous.     Past Medical History  Diagnosis Date  . Hypertension   . Hyperlipidemia   . MS (multiple sclerosis)   . Dyslipidemia   . Vitamin D deficiency     Past Surgical History  Procedure Laterality Date  . Esophagogastroduodenoscopy N/A 09/04/2012    Procedure: ESOPHAGOGASTRODUODENOSCOPY (EGD);  Surgeon: Jerene Bears, MD;  Location: [facility];  Service: Gastroenterology;  Laterality: N/A;  . Laparotomy N/A 09/07/2012    Procedure: EXPLORATORY LAPAROTOMY;  Surgeon: Leighton Ruff, MD;  Location: [facility];  Service: General;  Laterality: N/A;  . Partial gastrectomy N/A 09/07/2012    Procedure: PARTIAL GASTRECTOMY/RESECTION OF ABDOMINAL MASS;  Surgeon: Leighton Ruff, MD;  Location: [facility];  Service: General;  Laterality: N/A;    Family History  Problem Relation Age of Onset  . Stroke Mother   . Stroke Father   .  Schizophrenia Brother   . Hypertension Brother   . Hypertension Brother      History  Substance Use Topics  . Smoking status: Former Research scientist (life sciences)  . Smokeless tobacco: Never Used  . Alcohol Use: No    Prior to Admission medications   Medication Sig Start Date End Date Taking? Authorizing Provider  diclofenac (VOLTAREN) 75 MG EC tablet Take 75 mg by mouth 2 (two) times daily as needed for mild pain (back pain).    Yes Historical Provider, MD  ferrous sulfate (FERROUSUL) 325 (65 FE) MG tablet Take 1 tablet (325 mg total) by mouth daily with breakfast. 03/21/23  Yes Leighton Ruff, MD  glatiramer (COPAXONE) 20 MG/ML SOSY injection Inject 1 mL (20 mg total) into the skin daily. 07/27/13  Yes Adam Melvern Sample, DO  guaiFENesin (MUCINEX) 600 MG 12 hr tablet Take 600 mg by mouth 2 (two) times daily as needed for cough (cough).   Yes Historical Provider, MD  lisinopril (PRINIVIL,ZESTRIL) 10 MG tablet Take 10 mg by mouth daily.   Yes Historical Provider, MD  magnesium hydroxide (MILK OF MAGNESIA) 400 MG/5ML suspension Take 30 mLs by mouth daily as needed for mild constipation (constipation).   Yes Historical Provider, MD  simvastatin (ZOCOR) 10 MG tablet Take 10 mg by mouth at bedtime.    Yes Benito Mccreedy, MD  prochlorperazine (COMPAZINE) 10 MG tablet Take 1 tablet (10 mg total) by mouth every 6 (six) hours as  needed for nausea or vomiting. 11/16/12   Ladell Pier, MD    Current Facility-Administered Medications  Medication Dose Route Frequency Provider Last Rate Last Dose  . 0.9 %  sodium chloride infusion  10 mL/hr Intravenous Once Wandra Arthurs, MD      . cefTRIAXone (ROCEPHIN) 1 g in dextrose 5 % 50 mL IVPB  1 g Intravenous Once Wandra Arthurs, MD      . iohexol (OMNIPAQUE) 300 MG/ML solution 50 mL  50 mL Oral Once PRN Medication Radiologist, MD      . ondansetron (ZOFRAN) injection 4 mg  4 mg Intravenous Once Wandra Arthurs, MD   4 mg at 01/09/14 1429  . pantoprazole (PROTONIX) 80 mg in sodium  chloride 0.9 % 100 mL IVPB  80 mg Intravenous Once Wandra Arthurs, MD 300 mL/hr at 01/09/14 1544 80 mg at 01/09/14 1544  . pantoprazole (PROTONIX) 80 mg in sodium chloride 0.9 % 250 mL (0.32 mg/mL) infusion  8 mg/hr Intravenous Continuous Wandra Arthurs, MD      . Derrill Memo ON 01/13/2014] pantoprazole (PROTONIX) injection 40 mg  40 mg Intravenous Q12H Wandra Arthurs, MD       Current Outpatient Prescriptions  Medication Sig Dispense Refill  . diclofenac (VOLTAREN) 75 MG EC tablet Take 75 mg by mouth 2 (two) times daily as needed for mild pain (back pain).     . ferrous sulfate (FERROUSUL) 325 (65 FE) MG tablet Take 1 tablet (325 mg total) by mouth daily with breakfast. 30 tablet 0  . glatiramer (COPAXONE) 20 MG/ML SOSY injection Inject 1 mL (20 mg total) into the skin daily. 1 each 6  . guaiFENesin (MUCINEX) 600 MG 12 hr tablet Take 600 mg by mouth 2 (two) times daily as needed for cough (cough).    Marland Kitchen lisinopril (PRINIVIL,ZESTRIL) 10 MG tablet Take 10 mg by mouth daily.    . magnesium hydroxide (MILK OF MAGNESIA) 400 MG/5ML suspension Take 30 mLs by mouth daily as needed for mild constipation (constipation).    . simvastatin (ZOCOR) 10 MG tablet Take 10 mg by mouth at bedtime.     . prochlorperazine (COMPAZINE) 10 MG tablet Take 1 tablet (10 mg total) by mouth every 6 (six) hours as needed for nausea or vomiting. 30 tablet 1    Allergies as of 01/09/2014  . (No Known Allergies)     Review of Systems:    All systems reviewed and negative except where noted in HPI.  Endoscopic studies:  EGD August 2014 - submucosal mass, ulcerated.  Path: 1. 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 - PLEASE SEE ONCOLOGY TEMPLATE FOR DETAILS. 2. Stomach, resection, gastric wall margin - FOCALLY POSITIVE FOR GASTROINTESTINAL STROMAL TUMOR OVER THE SEROSA. Microscopic Comment    Physical Exam:  Vital signs in last 24 hours: Temp:  [97.5 F (36.4 C)] 97.5 F (36.4 C) (12/28 1339) Pulse Rate:  [94-96] 94 (12/28 1400) Resp:  [16] 16 (12/28 1339) BP: (105-109)/(64-65) 105/65 mmHg (12/28 1400) SpO2:  [97 %-98 %] 97 % (12/28 1400)   General:   Pleasant black female in NAD Head:  Normocephalic and atraumatic. Eyes:   No icterus.   Conjunctiva pink. Ears:  Normal auditory acuity. Neck:  Supple; no masses felt Lungs:  Respirations even and unlabored. Lungs clear to auscultation bilaterally.   No wheezes, crackles, or rhonchi.  Heart:  Regular  rate and rhythm Abdomen:  Soft, largely distended with generalized firmness. No particularly tender. Hypoactive bowel sounds. .  Rectal:  Inflamed, friable hemorrhoid. Scant soft, brown stool in vault..  Msk:  Symmetrical without gross deformities.  Extremities:  Without edema. Neurologic:  Alert and  oriented x4;  grossly normal neurologically. Skin:  Intact without significant lesions or rashes. Cervical Nodes:  No significant cervical adenopathy. Psych:  Alert and cooperative. Normal affect.  LAB RESULTS:  Recent Labs  01/09/14 1353  WBC 9.1  HGB 6.8*  HCT 22.1*  PLT 513*   BMET  Recent Labs  01/09/14 1353  NA 134*  K 3.9  CL 99  CO2 23  GLUCOSE 91  BUN 26*  CREATININE 3.15*  CALCIUM 9.3   LFT  Recent Labs  01/09/14 1353  PROT 7.5  ALBUMIN 3.0*  AST 16  ALT 7  ALKPHOS 36  BILITOT 0.6    PREVIOUS ENDOSCOPIES:            EGD August 2014, see HPI   Impression / Plan:   44. 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 She is s/p partial gastrectomy. Patient was unable to complete Gleevac secondary to diarrhea. Stopped treatment in June.  Now with recurrent tumor filling the abdomen and pelvis on non-contrast CTscan (AKI). Patient followed by Dr. Benay Spice.   2. Normocytic anemia. Hgb 11 in June, 6.8 today. She has scant bright red blood when  wiping but denies melena. No hematemesis. On DRE there is an inflamed, friable hemorrhoid but stool is brown. BUN only 26. No evidence for active GI bleeding at present, will reassess in am. Recommend BID PPI for precautionary measures.   3. AKI, hopefully this will correct with IVF    4. Abnormal UA. Antibiotics started  5. Multiple sclerosis, on Copaxone  6. Hemorrhoids. Trial of steroid suppositories  Thanks   LOS: 0 days   Tye Savoy  01/09/2014, 4:01 PM  [facility] Attending  I have also seen and assessed the patient and agree with the above note.  Bad situation with abdominal cavity literally filled with what must be tumor. Not having GI hemorrhage. Will f/u tomorrow and see what we might help with if at all, await Dr. Gearldine Shown assessment and plan. It may be that hospice makes most sense unless the tumor could respond well to chemotx.  Gatha Mayer, MD, [facility] [phone] (pager) 01/09/2014 5:21 PM

## 2014-01-09 NOTE — ED Notes (Signed)
Per EMS- Patient was at a gastrology appointment for follow up for GI Bleeding since Noveber. Today, patient c/o dizziness, initial BP-82/60 at gastrologist today. EMS reported 95/60 and 110/70. Patient also reports abdominal bloating since November as well. Patient denies any pain at this time.

## 2014-01-09 NOTE — ED Provider Notes (Addendum)
CSN: [id number]     Arrival date & time 01/09/14  1332 History   First MD Initiated Contact with Patient 01/09/14 1352     Chief Complaint  Patient presents with  . Hypotension  . Dizziness     (Consider location/radiation/quality/duration/timing/severity/associated sxs/prior Treatment) The history is provided by the patient.  Monica [name] is a 48 y.o. female hx of HTN, multiple sclerosis here with GI bleed. She has been having abdominal bloating and distention for several weeks. Has some bloody stools for several days. Denies vomiting. Went to [facility] and was noted to have guiac positive stools and was hypotensive 82/60 in the office so sent for eval. She feels dizzy but denies shortness of breath, chest pain.    Past Medical History  Diagnosis Date  . Hypertension   . Hyperlipidemia   . MS (multiple sclerosis)   . Low back pain   . Dyslipidemia   . Vitamin D deficiency   . Cancer    Past Surgical History  Procedure Laterality Date  . Esophagogastroduodenoscopy N/A 09/04/2012    Procedure: ESOPHAGOGASTRODUODENOSCOPY (EGD);  Surgeon: Jerene Bears, MD;  Location: [facility];  Service: Gastroenterology;  Laterality: N/A;  . Laparotomy N/A 09/07/2012    Procedure: EXPLORATORY LAPAROTOMY;  Surgeon: Leighton Ruff, MD;  Location: [facility];  Service: General;  Laterality: N/A;  . Partial gastrectomy N/A 09/07/2012    Procedure: PARTIAL GASTRECTOMY/RESECTION OF ABDOMINAL MASS;  Surgeon: Leighton Ruff, MD;  Location: [facility];  Service: General;  Laterality: N/A;   Family History  Problem Relation Age of Onset  . Stroke Mother   . Stroke Father   . Schizophrenia Brother   . Hypertension Brother   . Hypertension Brother    History  Substance Use Topics  . Smoking status: Former Smoker    Quit date: 01/09/2002  . Smokeless tobacco: Never Used  . Alcohol Use: No   OB History    No data available     Review of Systems  Neurological: Positive for dizziness.  All other systems  reviewed and are negative.     Allergies  Review of patient's allergies indicates no known allergies.  Home Medications   Prior to Admission medications   Medication Sig Start Date End Date Taking? Authorizing Provider  diclofenac (VOLTAREN) 75 MG EC tablet Take 75 mg by mouth 2 (two) times daily as needed for mild pain (back pain).    Yes Historical Provider, MD  ferrous sulfate (FERROUSUL) 325 (65 FE) MG tablet Take 1 tablet (325 mg total) by mouth daily with breakfast. 03/14/97  Yes Leighton Ruff, MD  glatiramer (COPAXONE) 20 MG/ML SOSY injection Inject 1 mL (20 mg total) into the skin daily. 07/27/13  Yes Adam Melvern Sample, DO  guaiFENesin (MUCINEX) 600 MG 12 hr tablet Take 600 mg by mouth 2 (two) times daily as needed for cough (cough).   Yes Historical Provider, MD  lisinopril (PRINIVIL,ZESTRIL) 10 MG tablet Take 10 mg by mouth daily.   Yes Historical Provider, MD  magnesium hydroxide (MILK OF MAGNESIA) 400 MG/5ML suspension Take 30 mLs by mouth daily as needed for mild constipation (constipation).   Yes Historical Provider, MD  simvastatin (ZOCOR) 10 MG tablet Take 10 mg by mouth at bedtime.    Yes Benito Mccreedy, MD  prochlorperazine (COMPAZINE) 10 MG tablet Take 1 tablet (10 mg total) by mouth every 6 (six) hours as needed for nausea or vomiting. 11/16/12   Ladell Pier, MD   BP 158/88 mmHg  Pulse 98  Temp(Src) 98.2 F (36.8 C) (Oral)  Resp 16  SpO2 95%  LMP 01/02/2014 Physical Exam  Constitutional: She is oriented to person, place, and time.  Chronically ill, pale   HENT:  Head: Normocephalic.  MM dry   Eyes: Conjunctivae are normal. Pupils are equal, round, and reactive to light.  Neck: Normal range of motion.  Cardiovascular: Normal rate, regular rhythm and normal heart sounds.   Pulmonary/Chest: Effort normal and breath sounds normal. No respiratory distress. She has no wheezes. She has no rales.  Abdominal:  Distended, + mild diffuse tenderness, worse in lower  abdominal area.   Musculoskeletal: Normal range of motion. She exhibits no edema or tenderness.  Neurological: She is alert and oriented to person, place, and time. No cranial nerve deficit. Coordination normal.  Skin: Skin is warm and dry.  Psychiatric: She has a normal mood and affect. Her behavior is normal. Judgment and thought content normal.  Nursing note and vitals reviewed.   ED Course  Procedures (including critical care time)  CRITICAL CARE Performed by: Darl Householder, DAVID   Total critical care time: 30 min   Critical care time was exclusive of separately billable procedures and treating other patients.  Critical care was necessary to treat or prevent imminent or life-threatening deterioration.  Critical care was time spent personally by me on the following activities: development of treatment plan with patient and/or surrogate as well as nursing, discussions with consultants, evaluation of patient's response to treatment, examination of patient, obtaining history from patient or surrogate, ordering and performing treatments and interventions, ordering and review of laboratory studies, ordering and review of radiographic studies, pulse oximetry and re-evaluation of patient's condition.   Labs Review Labs Reviewed  CBC WITH DIFFERENTIAL - Abnormal; Notable for the following:    RBC 2.55 (*)    Hemoglobin 6.8 (*)    HCT 22.1 (*)    Platelets 513 (*)    Neutrophils Relative % 85 (*)    Lymphocytes Relative 8 (*)    All other components within normal limits  COMPREHENSIVE METABOLIC PANEL - Abnormal; Notable for the following:    Sodium 134 (*)    BUN 26 (*)    Creatinine, Ser 3.15 (*)    Albumin 3.0 (*)    GFR calc non Af Amer 16 (*)    GFR calc Af Amer 19 (*)    All other components within normal limits  URINALYSIS, ROUTINE W REFLEX MICROSCOPIC - Abnormal; Notable for the following:    Color, Urine RED (*)    APPearance TURBID (*)    Hgb urine dipstick MODERATE (*)     Bilirubin Urine LARGE (*)    Ketones, ur 15 (*)    Protein, ur 100 (*)    Leukocytes, UA MODERATE (*)    All other components within normal limits  URINE MICROSCOPIC-ADD ON - Abnormal; Notable for the following:    Squamous Epithelial / LPF MANY (*)    Bacteria, UA MANY (*)    Casts HYALINE CASTS (*)    All other components within normal limits  CBC  COMPREHENSIVE METABOLIC PANEL  TYPE AND SCREEN  PREPARE RBC (CROSSMATCH)  ABO/RH    Imaging Review Ct Abdomen Pelvis Wo Contrast  01/09/2014   CLINICAL DATA:  Abdominal pain. Distention, bloating. GI bleeding and renal failure. History of GIST tumor.  EXAM: CT ABDOMEN AND PELVIS WITHOUT CONTRAST  TECHNIQUE: Multidetector CT imaging of the abdomen and pelvis was performed following the standard protocol  without IV contrast.  COMPARISON:  CT of the abdomen and pelvis on 09/04/2012  FINDINGS: Lower chest: There is significant nodular right perihilar lung density suspicious for tumor or adenopathy. Partially imaged right lower lobe parenchymal opacity is also noted. Heart size is normal.  Upper abdomen: Numerous soft tissue masses filled the abdomen. Given the lack of intravenous contrast, is difficult to distinguish 1 mass from another. However large masses are seen inferior to the liver, extending into the upper pelvis measuring at least 24 cm. Large mass in the left upper quadrant appears contiguous to the spleen and measures 17 cm. Numerous anterior peritoneal masses are identified, measuring 1-7 cm in diameter. The gallbladder is present and contains a gallstone.  The large right mid abdominal mass displaces the right kidney inferiorly. Multiple bilateral renal masses are identified. Although these may represent cysts, they are not fully characterized on today's exam. Despite the history of acute renal failure there is no evidence for hydronephrosis on this noncontrast exam.  Gastrointestinal tract: The stomach is surrounded by numerous soft tissue  masses with a very narrow contrast filled lumen. Surgical clips are identified in the region of the stomach. Small bowel loops are collapsed and partially contrast filled. Colonic loops are not yet filled with contrast at the time of exam. However, there does not appear to be bowel obstruction. No evidence for perforation or abscess.  Pelvis: Numerous soft tissue masses fill the pelvis, displacing the uterus and fallopian tubes anteriorly. The largest pelvic mass measures 10.5 x 18.2 cm. The ovaries are not well seen. The urinary bladder is completely compressed by the large pelvic mass.  Retroperitoneum: Numerous mesenteric nodules versus mesenteric lymph nodes. The lack of intravenous contrast limits the detection of retroperitoneal lymph nodes. No significant bulky retroperitoneal adenopathy identified.  Abdominal wall: There is a supraumbilical soft tissue nodule which may be palpable measuring 3.0 cm in the midline. This may be accessible for percutaneous biopsy if tissue diagnosis is needed.  Osseous structures: No suspicious lytic or blastic lesions are identified.  IMPRESSION: 1. Numerous soft tissue masses filling the abdomen and pelvis consistent with recurrent tumor. 2. Soft tissue masses cause significant narrowing of the gastric lumen. Small and large bowel loops also appear narrow/compressed but do not appear to be obstructed at this time. 3. No evidence for bowel perforation or abscess. 4. Findings are suspicious for right hilar lymphadenopathy or tumor nodularity. Consider further evaluation with CT of the chest. Contrast administration is recommended if the patient is able to tolerate contrast. 5. Despite the history of renal failure, there is no obvious hydronephrosis. There are renal mass is which may be related to cysts or tumor. 6. The salient findings were discussed with DAVID YAO on 01/09/2014 at 4:15 pm.   Electronically Signed   By: Shon Hale M.D.   On: 01/09/2014 16:33     EKG  Interpretation None      MDM   Final diagnoses:  Abdominal distention  Renal failure  Gastrointestinal hemorrhage with melena    Monica [name] is a 48 y.o. female here with blood in stool, ab pain. Concerned for GI bleed. Had GIST tumor s/p surgery, will get repeat CT to r/o mass vs SBO.   4 PM UA + UTI, given ceftriaxone. Has acute renal failure as well. Hg 6.8, given protonix. Transfusion ordered. I consulted  GI who will see her. Will admit for GI bleed. Hypotension improved with IVF. Called radiology regarding CT. Dr. Owens Shark saw recurrent tumor  but no obvious SBO. Will admit to stepdown.   Wandra Arthurs, MD 01/09/14 [place] Yao, MD 01/09/14 [phone]

## 2014-01-09 NOTE — H&P (Signed)
[facility] History and Physical  Monica [name] DGU:[id number] DOB: 10-02-1965 DOA: 01/09/2014  Referring physician: EDP PCP: Benito Mccreedy, MD   Chief Complaint: nausea ,, vomiting and abdominal distention since many weeks.   HPI: Monica [name] is a 48 y.o. female with prior h/o GIST tumour s/p resection , follows up with Dr Learta Codding, comes in for worsening abdominal distention from th emonth of November associated with nausea, vomiting and abdominal pain. On arrival to ED, a CT abd pelvis was done showed multiple abdominal masses narrowing gastric lumen and compressing small bowel loops. She initially showed up at Dr Hilarie Fredrickson office, was found to be hypotensive and anemic with hemoglobin of 6.8. She was then referred to ED For further management. Her bp after 2 fluid boluses has improved. Her stool guaic was positive for blood. She was referred to medical service for further evaluation. Gastroenterology consulted for further recommendations. She reports constipation. She denies diarrhea. She denies any chest pain or fever. reprots having back pain and unable to lie on her sides when she tries to sleep.    Review of Systems:   See HPI for detailed ROS.   Past Medical History  Diagnosis Date  . Hypertension   . Hyperlipidemia   . MS (multiple sclerosis)   . Low back pain   . Dyslipidemia   . Vitamin D deficiency    Past Surgical History  Procedure Laterality Date  . Esophagogastroduodenoscopy N/A 09/04/2012    Procedure: ESOPHAGOGASTRODUODENOSCOPY (EGD);  Surgeon: Jerene Bears, MD;  Location: [facility];  Service: Gastroenterology;  Laterality: N/A;  . Laparotomy N/A 09/07/2012    Procedure: EXPLORATORY LAPAROTOMY;  Surgeon: Leighton Ruff, MD;  Location: [facility];  Service: General;  Laterality: N/A;  . Partial gastrectomy N/A 09/07/2012    Procedure: PARTIAL GASTRECTOMY/RESECTION OF ABDOMINAL MASS;  Surgeon: Leighton Ruff, MD;  Location: [facility];  Service: General;  Laterality:  N/A;   Social History:  reports that she quit smoking about 12 years ago. She has never used smokeless tobacco. She reports that she does not drink alcohol or use illicit drugs.  No Known Allergies  Family History  Problem Relation Age of Onset  . Stroke Mother   . Stroke Father   . Schizophrenia Brother   . Hypertension Brother   . Hypertension Brother      Prior to Admission medications   Medication Sig Start Date End Date Taking? Authorizing Provider  diclofenac (VOLTAREN) 75 MG EC tablet Take 75 mg by mouth 2 (two) times daily as needed for mild pain (back pain).    Yes Historical Provider, MD  ferrous sulfate (FERROUSUL) 325 (65 FE) MG tablet Take 1 tablet (325 mg total) by mouth daily with breakfast. 09/17/61  Yes Leighton Ruff, MD  glatiramer (COPAXONE) 20 MG/ML SOSY injection Inject 1 mL (20 mg total) into the skin daily. 07/27/13  Yes Adam Melvern Sample, DO  guaiFENesin (MUCINEX) 600 MG 12 hr tablet Take 600 mg by mouth 2 (two) times daily as needed for cough (cough).   Yes Historical Provider, MD  lisinopril (PRINIVIL,ZESTRIL) 10 MG tablet Take 10 mg by mouth daily.   Yes Historical Provider, MD  magnesium hydroxide (MILK OF MAGNESIA) 400 MG/5ML suspension Take 30 mLs by mouth daily as needed for mild constipation (constipation).   Yes Historical Provider, MD  simvastatin (ZOCOR) 10 MG tablet Take 10 mg by mouth at bedtime.    Yes Benito Mccreedy, MD  prochlorperazine (COMPAZINE) 10 MG tablet Take 1 tablet (10 mg total)  by mouth every 6 (six) hours as needed for nausea or vomiting. 11/16/12   Ladell Pier, MD   Physical Exam: Filed Vitals:   01/09/14 1339 01/09/14 1400 01/09/14 1632  BP: 109/64 105/65 124/74  Pulse: 96 94 98  Temp: 97.5 F (36.4 C)  98 F (36.7 C)  TempSrc: Oral  Oral  Resp: 16  16  SpO2: 98% 97% 97%    Wt Readings from Last 3 Encounters:  09/22/13 99.247 kg (218 lb 12.8 oz)  07/27/13 97.977 kg (216 lb)  06/20/13 95.8 kg (211 lb 3.2 oz)     General:  Appears in mild distress.  Eyes: PERRL, normal lids, irises & conjunctiva Cardiovascular: RRR, no m/r/g. No LE edema. Telemetry: SR, no arrhythmias  Respiratory: CTA bilaterally, no w/r/r. Normal respiratory effort. Abdomen: firm, massive abdominal distention, with generalized abdominal tenderness. No signs of peritonitis. Bowel sounds could not be heard.  Skin: no rash or induration seen on limited exam Musculoskeletal: grossly normal tone BUE/BLE Neurologic: grossly non-focal.          Labs on Admission:  Basic Metabolic Panel:  Recent Labs Lab 01/09/14 1353  NA 134*  K 3.9  CL 99  CO2 23  GLUCOSE 91  BUN 26*  CREATININE 3.15*  CALCIUM 9.3   Liver Function Tests:  Recent Labs Lab 01/09/14 1353  AST 16  ALT 7  ALKPHOS 89  BILITOT 0.6  PROT 7.5  ALBUMIN 3.0*   No results for input(s): LIPASE, AMYLASE in the last 168 hours. No results for input(s): AMMONIA in the last 168 hours. CBC:  Recent Labs Lab 01/09/14 1353  WBC 9.1  NEUTROABS 7.7  HGB 6.8*  HCT 22.1*  MCV 86.7  PLT 513*   Cardiac Enzymes: No results for input(s): CKTOTAL, CKMB, CKMBINDEX, TROPONINI in the last 168 hours.  BNP (last 3 results) No results for input(s): PROBNP in the last 8760 hours. CBG: No results for input(s): GLUCAP in the last 168 hours.  Radiological Exams on Admission: Ct Abdomen Pelvis Wo Contrast  01/09/2014   CLINICAL DATA:  Abdominal pain. Distention, bloating. GI bleeding and renal failure. History of GIST tumor.  EXAM: CT ABDOMEN AND PELVIS WITHOUT CONTRAST  TECHNIQUE: Multidetector CT imaging of the abdomen and pelvis was performed following the standard protocol without IV contrast.  COMPARISON:  CT of the abdomen and pelvis on 09/04/2012  FINDINGS: Lower chest: There is significant nodular right perihilar lung density suspicious for tumor or adenopathy. Partially imaged right lower lobe parenchymal opacity is also noted. Heart size is normal.  Upper  abdomen: Numerous soft tissue masses filled the abdomen. Given the lack of intravenous contrast, is difficult to distinguish 1 mass from another. However large masses are seen inferior to the liver, extending into the upper pelvis measuring at least 24 cm. Large mass in the left upper quadrant appears contiguous to the spleen and measures 17 cm. Numerous anterior peritoneal masses are identified, measuring 1-7 cm in diameter. The gallbladder is present and contains a gallstone.  The large right mid abdominal mass displaces the right kidney inferiorly. Multiple bilateral renal masses are identified. Although these may represent cysts, they are not fully characterized on today's exam. Despite the history of acute renal failure there is no evidence for hydronephrosis on this noncontrast exam.  Gastrointestinal tract: The stomach is surrounded by numerous soft tissue masses with a very narrow contrast filled lumen. Surgical clips are identified in the region of the stomach. Small bowel loops are  collapsed and partially contrast filled. Colonic loops are not yet filled with contrast at the time of exam. However, there does not appear to be bowel obstruction. No evidence for perforation or abscess.  Pelvis: Numerous soft tissue masses fill the pelvis, displacing the uterus and fallopian tubes anteriorly. The largest pelvic mass measures 10.5 x 18.2 cm. The ovaries are not well seen. The urinary bladder is completely compressed by the large pelvic mass.  Retroperitoneum: Numerous mesenteric nodules versus mesenteric lymph nodes. The lack of intravenous contrast limits the detection of retroperitoneal lymph nodes. No significant bulky retroperitoneal adenopathy identified.  Abdominal wall: There is a supraumbilical soft tissue nodule which may be palpable measuring 3.0 cm in the midline. This may be accessible for percutaneous biopsy if tissue diagnosis is needed.  Osseous structures: No suspicious lytic or blastic lesions  are identified.  IMPRESSION: 1. Numerous soft tissue masses filling the abdomen and pelvis consistent with recurrent tumor. 2. Soft tissue masses cause significant narrowing of the gastric lumen. Small and large bowel loops also appear narrow/compressed but do not appear to be obstructed at this time. 3. No evidence for bowel perforation or abscess. 4. Findings are suspicious for right hilar lymphadenopathy or tumor nodularity. Consider further evaluation with CT of the chest. Contrast administration is recommended if the patient is able to tolerate contrast. 5. Despite the history of renal failure, there is no obvious hydronephrosis. There are renal mass is which may be related to cysts or tumor. 6. The salient findings were discussed with DAVID YAO on 01/09/2014 at 4:15 pm.   Electronically Signed   By: Shon Hale M.D.   On: 01/09/2014 16:33    SMO:[id number].   Assessment/Plan Active Problems:   GI bleed   Abdominal distention, nausea, vomiting and abdominal pain: secondary to recurrence of the gist tumour.  Will request Dr Learta Codding for further recommendations.    Anemia, : Probably from the tumour progression and mild hemorrhoidal bleeding.  - no hematemesis or malena.  - 2units of prbc transfusion ordered and repeat H&H in am - IV PPI - gastroenterology on board.   Acute renal failure: - no evidence of hydronephrosis on CT.  Probably a combination of hypoperfusion, evident hypotension and Dehydration, poor po intake and the use of ACEi nhibitors and  Infection from UTI.  - hydration, rocephin for UTI , urine electrolytes and stopping lisinopril. - Repeat renal parameters in am.    Hypotension improved after fluid boluses.    dvt prophylaxis.  Code Status: full code.  DVT Prophylaxis: Family Communication: none atbedside, offered to call family,but she declined and reported her son is the only family and she has already updated him.  Disposition Plan: admit to step down.  Time  spent: 85 min  [facility] Pager [phone]

## 2014-01-09 NOTE — ED Notes (Signed)
Bed: WA09 Expected date:  Expected time:  Means of arrival:  Comments: EMS-hypotensive/bloating

## 2014-01-10 ENCOUNTER — Encounter (HOSPITAL_COMMUNITY): Payer: Self-pay | Admitting: Radiology

## 2014-01-10 DIAGNOSIS — C494 Malignant neoplasm of connective and soft tissue of abdomen: Principal | ICD-10-CM

## 2014-01-10 DIAGNOSIS — D62 Acute posthemorrhagic anemia: Secondary | ICD-10-CM

## 2014-01-10 DIAGNOSIS — G35 Multiple sclerosis: Secondary | ICD-10-CM

## 2014-01-10 DIAGNOSIS — D701 Agranulocytosis secondary to cancer chemotherapy: Secondary | ICD-10-CM

## 2014-01-10 DIAGNOSIS — D638 Anemia in other chronic diseases classified elsewhere: Secondary | ICD-10-CM

## 2014-01-10 DIAGNOSIS — N19 Unspecified kidney failure: Secondary | ICD-10-CM

## 2014-01-10 DIAGNOSIS — R1900 Intra-abdominal and pelvic swelling, mass and lump, unspecified site: Secondary | ICD-10-CM

## 2014-01-10 DIAGNOSIS — C7989 Secondary malignant neoplasm of other specified sites: Secondary | ICD-10-CM

## 2014-01-10 DIAGNOSIS — D509 Iron deficiency anemia, unspecified: Secondary | ICD-10-CM

## 2014-01-10 DIAGNOSIS — C499 Malignant neoplasm of connective and soft tissue, unspecified: Secondary | ICD-10-CM

## 2014-01-10 LAB — CBC
HCT: 26.3 % — ABNORMAL LOW (ref 36.0–46.0)
Hemoglobin: 8.4 g/dL — ABNORMAL LOW (ref 12.0–15.0)
MCH: 27.6 pg (ref 26.0–34.0)
MCHC: 31.9 g/dL (ref 30.0–36.0)
MCV: 86.5 fL (ref 78.0–100.0)
Platelets: 440 10*3/uL — ABNORMAL HIGH (ref 150–400)
RBC: 3.04 MIL/uL — AB (ref 3.87–5.11)
RDW: 15.5 % (ref 11.5–15.5)
WBC: 8.6 10*3/uL (ref 4.0–10.5)

## 2014-01-10 LAB — COMPREHENSIVE METABOLIC PANEL
ALT: 7 U/L (ref 0–35)
AST: 13 U/L (ref 0–37)
Albumin: 2.6 g/dL — ABNORMAL LOW (ref 3.5–5.2)
Alkaline Phosphatase: 83 U/L (ref 39–117)
Anion gap: 12 (ref 5–15)
BUN: 25 mg/dL — ABNORMAL HIGH (ref 6–23)
CALCIUM: 8.6 mg/dL (ref 8.4–10.5)
CO2: 19 mmol/L (ref 19–32)
Chloride: 105 mEq/L (ref 96–112)
Creatinine, Ser: 2.65 mg/dL — ABNORMAL HIGH (ref 0.50–1.10)
GFR calc non Af Amer: 20 mL/min — ABNORMAL LOW (ref >90)
GFR, EST AFRICAN AMERICAN: 24 mL/min — AB (ref >90)
GLUCOSE: 82 mg/dL (ref 70–99)
Potassium: 3.6 mmol/L (ref 3.5–5.1)
SODIUM: 136 mmol/L (ref 135–145)
Total Bilirubin: 0.5 mg/dL (ref 0.3–1.2)
Total Protein: 6.4 g/dL (ref 6.0–8.3)

## 2014-01-10 LAB — TYPE AND SCREEN
ABO/RH(D): O POS
Antibody Screen: NEGATIVE
Unit division: 0
Unit division: 0
Unit division: 0

## 2014-01-10 MED ORDER — IMATINIB MESYLATE 100 MG PO TABS
400.0000 mg | ORAL_TABLET | Freq: Every day | ORAL | Status: DC
Start: 2014-01-10 — End: 2014-01-10

## 2014-01-10 NOTE — Progress Notes (Addendum)
IP PROGRESS NOTE  Subjective:   Monica [name] is known to me with a history of a gastrointestinal stromal tumor. She was admitted yesterday with abdominal distention and severe anemia. A CT of the abdomen and pelvis confirmed multiple abdominal and pelvic masses consistent with progression of the gastrointestinal stromal tumor.  Monica [name] has been maintained off of [place] since June 2015.  She reports progressive abdominal distention and discomfort over the past month. She denies bleeding other that her menstrual cycle.    Objective: Vital signs in last 24 hours: Blood pressure 125/60, pulse 89, temperature 98.2 F (36.8 C), temperature source Oral, resp. rate 13, height 5\' 8"  (1.727 m), weight 214 lb 1.1 oz (97.1 kg), last menstrual period 01/02/2014, SpO2 92 %.  Intake/Output from previous day: 12/28 0701 - 12/29 0700 In: 3632.5 [P.O.:240; I.V.:1242.5; Blood:1000; IV Piggyback:1150] Out: 300 [Urine:300]  Physical Exam:  HEENT: Neck without mass Lungs: Decreased breath sounds at the lower chest bilaterally, no respiratory distress Cardiac: Regular rate and rhythm Abdomen: Markedly distended with areas of firmness, no discrete mass Extremities: No leg edema Lymph nodes: No cervical, supraclavicular, or inguinal nodes    Lab Results:  Recent Labs  01/09/14 1353 01/10/14 0350  WBC 9.1 8.6  HGB 6.8* 8.4*  HCT 22.1* 26.3*  PLT 513* 440*    BMET  Recent Labs  01/09/14 1353 01/10/14 0350  NA 134* 136  K 3.9 3.6  CL 99 105  CO2 23 19  GLUCOSE 91 82  BUN 26* 25*  CREATININE 3.15* 2.65*  CALCIUM 9.3 8.6    Studies/Results: Ct Abdomen Pelvis Wo Contrast  01/09/2014   CLINICAL DATA:  Abdominal pain. Distention, bloating. GI bleeding and renal failure. History of GIST tumor.  EXAM: CT ABDOMEN AND PELVIS WITHOUT CONTRAST  TECHNIQUE: Multidetector CT imaging of the abdomen and pelvis was performed following the standard protocol without IV contrast.  COMPARISON:  CT of  the abdomen and pelvis on 09/04/2012  FINDINGS: Lower chest: There is significant nodular right perihilar lung density suspicious for tumor or adenopathy. Partially imaged right lower lobe parenchymal opacity is also noted. Heart size is normal.  Upper abdomen: Numerous soft tissue masses filled the abdomen. Given the lack of intravenous contrast, is difficult to distinguish 1 mass from another. However large masses are seen inferior to the liver, extending into the upper pelvis measuring at least 24 cm. Large mass in the left upper quadrant appears contiguous to the spleen and measures 17 cm. Numerous anterior peritoneal masses are identified, measuring 1-7 cm in diameter. The gallbladder is present and contains a gallstone.  The large right mid abdominal mass displaces the right kidney inferiorly. Multiple bilateral renal masses are identified. Although these may represent cysts, they are not fully characterized on today's exam. Despite the history of acute renal failure there is no evidence for hydronephrosis on this noncontrast exam.  Gastrointestinal tract: The stomach is surrounded by numerous soft tissue masses with a very narrow contrast filled lumen. Surgical clips are identified in the region of the stomach. Small bowel loops are collapsed and partially contrast filled. Colonic loops are not yet filled with contrast at the time of exam. However, there does not appear to be bowel obstruction. No evidence for perforation or abscess.  Pelvis: Numerous soft tissue masses fill the pelvis, displacing the uterus and fallopian tubes anteriorly. The largest pelvic mass measures 10.5 x 18.2 cm. The ovaries are not well seen. The urinary bladder is completely compressed by the large  pelvic mass.  Retroperitoneum: Numerous mesenteric nodules versus mesenteric lymph nodes. The lack of intravenous contrast limits the detection of retroperitoneal lymph nodes. No significant bulky retroperitoneal adenopathy identified.   Abdominal wall: There is a supraumbilical soft tissue nodule which may be palpable measuring 3.0 cm in the midline. This may be accessible for percutaneous biopsy if tissue diagnosis is needed.  Osseous structures: No suspicious lytic or blastic lesions are identified.  IMPRESSION: 1. Numerous soft tissue masses filling the abdomen and pelvis consistent with recurrent tumor. 2. Soft tissue masses cause significant narrowing of the gastric lumen. Small and large bowel loops also appear narrow/compressed but do not appear to be obstructed at this time. 3. No evidence for bowel perforation or abscess. 4. Findings are suspicious for right hilar lymphadenopathy or tumor nodularity. Consider further evaluation with CT of the chest. Contrast administration is recommended if the patient is able to tolerate contrast. 5. Despite the history of renal failure, there is no obvious hydronephrosis. There are renal mass is which may be related to cysts or tumor. 6. The salient findings were discussed with DAVID YAO on 01/09/2014 at 4:15 pm.   Electronically Signed   By: Shon Hale M.D.   On: 01/09/2014 16:33    Medications: I have reviewed the patient's current medications.  Assessment/Plan:  1. Gastrointestinal tumor of the stomach, high-grade, stage IIIB (T4 NX) disease, ruptured tumor, status post a partial gastrectomy 09/07/2012. She began adjuvant Gleevec 10/19/2012, dose reduced secondary to neutropenia, Gleevec discontinued 06/20/2013  CT of the abdomen and pelvis 01/09/2014 revealed numerous soft tissue masses in the abdomen and pelvis consistent with recurrent tumor. There is narrowing of the gastric and bowel lumen was without obstruction., Right hilar lymphadenopathy  2. Severe anemia-most likely related to chronic disease, renal failure, and chronic GI blood loss related to the recurrent tumor  3.  History of multiple sclerosis-followed by Dr. Jannifer Franklin  4.  History of neutropenia secondary to  [facility]  Monica [name] appears to have massive recurrence of the gastrointestinal stromal tumor involving the abdomen and pelvis. I discussed the CT findings and treatment options with her today. No therapy will be curative. There is a significant chance the tumor may respond to [place]  therapy, though she had difficulty tolerating Gleevec in the past. We can also consider treatment with sunitinib.  Monica [name] lives alone and has no family members in town. She appears to be a candidate for Hospice care despite the plan for treatment of the gastrointestinal stromal tumor.  Recommendations: 1. Consult interventional radiology for biopsy of an abdominal mass to confirm recurrent gastrointestinal stromal tumor and for tumor mutation testing 2. Narcotic analgesics as needed for pain 3. Monitor hemoglobin and transfuse packed red blood cells as needed 4. Consider trial of erythropoietin for persistent anemia and renal failure 5. Consider a nephrology consult to evaluate the renal failure 6. Began [place] versus [place] next one to 2 days pending her renal function 7. Social work consult for help with transportation and home care needs  I will see her 01/11/2014. I will be out after 01/11/2014 until 01/18/2014. Oncology will continue following her in the hospital and we will schedule an outpatient appointment.   LOS: 1 day   Tamarah Bhullar  01/10/2014, 2:24 PM

## 2014-01-10 NOTE — Care Management Note (Unsigned)
    Page 1 of 2   01/10/2014     1:35:39 PM CARE MANAGEMENT NOTE 01/10/2014  Patient:  [facility]   Account Number:  [id number]  Date Initiated:  01/10/2014  Documentation initiated by:  [facility]  Subjective/Objective Assessment:   adm: nausea ,, vomiting and abdominal distention since many weeks.     Action/Plan:   discharge planning   Anticipated DC Date:  01/12/2014   Anticipated DC Plan:  [place]  CM consult      [facility] Choice  HOME HEALTH   Choice offered to / List presented to:  C-1 Patient        [place] arranged  HH-1 RN  [place].   Status of service:  In process, will continue to follow Medicare Important Message given?   (If response is "NO", the following Medicare IM given date fields will be blank) Date Medicare IM given:   Medicare IM given by:   Date Additional Medicare IM given:   Additional Medicare IM given by:    Discharge Disposition:  [place]  Per UR Regulation:    If discussed at Long Length of Stay Meetings, dates discussed:    Comments:  01/10/14 13:00 CM received call from Head CM stating discharge planning needs to be initiated. CM spoke with RN who states pt will continue to receive chemo and her understanding of the pt's prognosis is poor and hospice needed to be discussed with pt.  CM met pt in room and pt was adamantly opposed to residential hospice, adamantly opposed to home hospice, adamantly opposed to SNF regardless of the lack of support at home.  Pt lives alone and has one friend who is a Equities trader who helps when she can.  Pt states she want to aggressively fight and continue with aggresive treatment and is not ready for hospice.  Pt states she is going home and is willing to have HHRN/Aide/SW assist at home.  CM explained the actual number of hours HH will be at her hoe is realistically  less than 2 hrs per day and HH will not be there everyday. CM offered pt a private duty list but pt states she cannot afford private duty help.  When asked how she gets to and from appointments, pt stated she uses Medicaid transportation.  Though pt admits to extreme fatigue and admits she cannnot do much for herself, she continues to be adamantly opposed to anything but [facility] services at this time. HHRN/Aide/SW has been requested.  Choice of home health agency offfered; pt chooses [facility]; [facility] rep, Cyril Mourning made aware.  Will continue to follow.  Mariane Masters, BSN, CM [phone].

## 2014-01-10 NOTE — Progress Notes (Signed)
Utilization review completed.  

## 2014-01-10 NOTE — H&P (Signed)
Referring Physician(s): Dr. Benay Spice  Subjective: 48 yo female with prior hx of gastric GIST on prior treatment. Now with distended abd. New CT finds extensive soft tissue masses in her abdomen and pelvis. Pt has also been admitted with anemia requiring PRBC transfusion. Oncology has assessed pt and requests biopsy of abdominal mass for tissue diagnosis. Chart, PMHx, meds, imaging reviewed.  Allergies: Review of patient's allergies indicates no known allergies.  Medications: Prior to Admission medications   Medication Sig Start Date End Date Taking? Authorizing Provider  diclofenac (VOLTAREN) 75 MG EC tablet Take 75 mg by mouth 2 (two) times daily as needed for mild pain (back pain).    Yes Historical Provider, MD  ferrous sulfate (FERROUSUL) 325 (65 FE) MG tablet Take 1 tablet (325 mg total) by mouth daily with breakfast. 05/21/50  Yes Leighton Ruff, MD  glatiramer (COPAXONE) 20 MG/ML SOSY injection Inject 1 mL (20 mg total) into the skin daily. 07/27/13  Yes Adam Melvern Sample, DO  guaiFENesin (MUCINEX) 600 MG 12 hr tablet Take 600 mg by mouth 2 (two) times daily as needed for cough (cough).   Yes Historical Provider, MD  lisinopril (PRINIVIL,ZESTRIL) 10 MG tablet Take 10 mg by mouth daily.   Yes Historical Provider, MD  magnesium hydroxide (MILK OF MAGNESIA) 400 MG/5ML suspension Take 30 mLs by mouth daily as needed for mild constipation (constipation).   Yes Historical Provider, MD  simvastatin (ZOCOR) 10 MG tablet Take 10 mg by mouth at bedtime.    Yes Benito Mccreedy, MD  prochlorperazine (COMPAZINE) 10 MG tablet Take 1 tablet (10 mg total) by mouth every 6 (six) hours as needed for nausea or vomiting. 11/16/12   Ladell Pier, MD    Review of Systems  Vital Signs: BP 144/63 mmHg  Pulse 93  Temp(Src) 97.8 F (36.6 C) (Oral)  Resp 18  Ht 5\' 8"  (1.727 m)  Wt 214 lb 1.1 oz (97.1 kg)  BMI 32.56 kg/m2  SpO2 96%  LMP 01/02/2014  Physical Exam Lungs: CTA without  w/r/r Heart: Regular Abdomen: distended with mass effect, minimally tender.   Imaging: Ct Abdomen Pelvis Wo Contrast  01/09/2014   CLINICAL DATA:  Abdominal pain. Distention, bloating. GI bleeding and renal failure. History of GIST tumor.  EXAM: CT ABDOMEN AND PELVIS WITHOUT CONTRAST  TECHNIQUE: Multidetector CT imaging of the abdomen and pelvis was performed following the standard protocol without IV contrast.  COMPARISON:  CT of the abdomen and pelvis on 09/04/2012  FINDINGS: Lower chest: There is significant nodular right perihilar lung density suspicious for tumor or adenopathy. Partially imaged right lower lobe parenchymal opacity is also noted. Heart size is normal.  Upper abdomen: Numerous soft tissue masses filled the abdomen. Given the lack of intravenous contrast, is difficult to distinguish 1 mass from another. However large masses are seen inferior to the liver, extending into the upper pelvis measuring at least 24 cm. Large mass in the left upper quadrant appears contiguous to the spleen and measures 17 cm. Numerous anterior peritoneal masses are identified, measuring 1-7 cm in diameter. The gallbladder is present and contains a gallstone.  The large right mid abdominal mass displaces the right kidney inferiorly. Multiple bilateral renal masses are identified. Although these may represent cysts, they are not fully characterized on today's exam. Despite the history of acute renal failure there is no evidence for hydronephrosis on this noncontrast exam.  Gastrointestinal tract: The stomach is surrounded by numerous soft tissue masses with a very narrow contrast  filled lumen. Surgical clips are identified in the region of the stomach. Small bowel loops are collapsed and partially contrast filled. Colonic loops are not yet filled with contrast at the time of exam. However, there does not appear to be bowel obstruction. No evidence for perforation or abscess.  Pelvis: Numerous soft tissue masses fill  the pelvis, displacing the uterus and fallopian tubes anteriorly. The largest pelvic mass measures 10.5 x 18.2 cm. The ovaries are not well seen. The urinary bladder is completely compressed by the large pelvic mass.  Retroperitoneum: Numerous mesenteric nodules versus mesenteric lymph nodes. The lack of intravenous contrast limits the detection of retroperitoneal lymph nodes. No significant bulky retroperitoneal adenopathy identified.  Abdominal wall: There is a supraumbilical soft tissue nodule which may be palpable measuring 3.0 cm in the midline. This may be accessible for percutaneous biopsy if tissue diagnosis is needed.  Osseous structures: No suspicious lytic or blastic lesions are identified.  IMPRESSION: 1. Numerous soft tissue masses filling the abdomen and pelvis consistent with recurrent tumor. 2. Soft tissue masses cause significant narrowing of the gastric lumen. Small and large bowel loops also appear narrow/compressed but do not appear to be obstructed at this time. 3. No evidence for bowel perforation or abscess. 4. Findings are suspicious for right hilar lymphadenopathy or tumor nodularity. Consider further evaluation with CT of the chest. Contrast administration is recommended if the patient is able to tolerate contrast. 5. Despite the history of renal failure, there is no obvious hydronephrosis. There are renal mass is which may be related to cysts or tumor. 6. The salient findings were discussed with DAVID YAO on 01/09/2014 at 4:15 pm.   Electronically Signed   By: Shon Hale M.D.   On: 01/09/2014 16:33    Labs:  CBC:  Recent Labs  06/20/13 1501 09/22/13 1454 01/09/14 1353 01/10/14 0350  WBC 2.5* 3.7* 9.1 8.6  HGB 10.4* 11.3* 6.8* 8.4*  HCT 32.1* 34.5* 22.1* 26.3*  PLT 183 251 513* 440*    COAGS: No results for input(s): INR, APTT in the last 8760 hours.  BMP:  Recent Labs  04/14/13 1011 06/20/13 1502 08/22/13 1130 01/09/14 1353 01/10/14 0350  NA 142 139  --   134* 136  K 3.5 3.7  --  3.9 3.6  CL  --   --   --  99 105  CO2 23 24  --  23 19  GLUCOSE 92 87  --  91 82  BUN 10.6 11.8  --  26* 25*  CALCIUM 8.9 8.9  --  9.3 8.6  CREATININE 0.9 1.0 0.97 3.15* 2.65*  GFRNONAA  --   --  69* 16* 20*  GFRAA  --   --  80* 19* 24*    LIVER FUNCTION TESTS:  Recent Labs  04/14/13 1011 06/20/13 1502 01/09/14 1353 01/10/14 0350  BILITOT 0.44 0.42 0.6 0.5  AST 10 10 16 13   ALT 7 6 7 7   ALKPHOS 44 46 89 83  PROT 6.9 6.7 7.5 6.4  ALBUMIN 3.9 3.9 3.0* 2.6*    Assessment and Plan: Numerous intra-abdominal masses concerning for malignant process in pt with hx of GIST Plan for CT guided biopsy. IR schedule is very congested but will make attempt to get procedure done tomorrow, though there is a good chance procedure may not be done until Thurs. This was relayed to the pt. Procedure, risks, complications, use of sedation reviewed. Labs ok Consent signed in chart    I spent  a total of 20 minutes face to face in clinical consultation/evaluation, greater than 50% of which was counseling/coordinating care for biopsy  Signed: Ascencion Dike 01/10/2014, 4:19 PM

## 2014-01-10 NOTE — Progress Notes (Signed)
INITIAL NUTRITION ASSESSMENT  DOCUMENTATION CODES Per approved criteria  -Not Applicable   INTERVENTION: -Diet advancement per MD -Recommend vanilla Ensure BID as diet advancement tolerated -Continue with anti-emetic regimen -RD to continue to monitor  NUTRITION DIAGNOSIS: Inadequate oral intake related to n/v/abd pain as evidenced by PO intake < 75% for 1-2 months.   Goal: Pt to meet >/= 90% of their estimated nutrition needs    Monitor:  Diet order, total protein/energy intake, labs, weights, GI profile  Reason for Assessment: MST  48 y.o. female  Admitting Dx: <principal problem not specified>  ASSESSMENT: Monica [name] is a 48 y.o. female with prior h/o GIST tumour s/p resection , follows up with Dr Learta Codding, comes in for worsening abdominal distention from th emonth of November associated with nausea, vomiting and abdominal pain  -Pt reported decreased appetite since November d/t nausea, vomiting and abd pain -Diet recall indicates pt consuming small meals/snack of bland foods - toast, cereal, etc. Would drink vanilla Ensure occasionally.  -Reported non significant weight loss of 5 lbs (2.2% body weight, non-significant) -H&P indicates pt w/constipation, which also likely contributing to decreased intake. -Tolerating <50% of clear liquid diet. Confirmed some improvement with nausea with medications. -MD noted abd distension r/t to recurrence of GIST tumor. Per note, Pt had been on chemotherapy in 2014; but stopped treatments in 2015 d/t diarrhea -Pt willing to drink vanilla Ensure once diet advancement tolerated  Height:  Ht Readings from Last 1 Encounters:  01/09/14 5\' 8"  (1.727 m)    Weight: Wt Readings from Last 1 Encounters:  01/10/14 214 lb 1.1 oz (97.1 kg)    Ideal Body Weight: 140 lb  % Ideal Body Weight: 153%  Wt Readings from Last 10 Encounters:  01/10/14 214 lb 1.1 oz (97.1 kg)  09/22/13 218 lb 12.8 oz (99.247 kg)  07/27/13 216 lb (97.977 kg)   06/20/13 211 lb 3.2 oz (95.8 kg)  05/23/13 206 lb 14.4 oz (93.849 kg)  04/14/13 204 lb 1.6 oz (92.579 kg)  03/16/13 206 lb (93.441 kg)  01/21/13 205 lb 9.6 oz (93.26 kg)  12/30/12 209 lb (94.802 kg)  12/17/12 208 lb 3.2 oz (94.439 kg)    Usual Body Weight: 218 lb  % Usual Body Weight: 98%  BMI:  Body mass index is 32.56 kg/(m^2). Obesity  Estimated Nutritional Needs: Kcal: 2000-2200 Protein: 110-120 gram Fluid: >/= 2000 ml daily  Skin: WDL  Diet Order: Diet clear liquid  EDUCATION NEEDS: -No education needs identified at this time   Intake/Output Summary (Last 24 hours) at 01/10/14 1231 Last data filed at 01/10/14 0842  Gross per 24 hour  Intake 3632.5 ml  Output    400 ml  Net 3232.5 ml    Last BM: 12/28   Labs:   Recent Labs Lab 01/09/14 1353 01/10/14 0350  NA 134* 136  K 3.9 3.6  CL 99 105  CO2 23 19  BUN 26* 25*  CREATININE 3.15* 2.65*  CALCIUM 9.3 8.6  GLUCOSE 91 82    CBG (last 3)  No results for input(s): GLUCAP in the last 72 hours.  Scheduled Meds: . cefTRIAXone (ROCEPHIN)  IV  1 g Intravenous Q24H  . feeding supplement (RESOURCE BREEZE)  1 Container Oral TID BM  . hydrocortisone  25 mg Rectal QHS  . ondansetron (ZOFRAN) IV  4 mg Intravenous Once  . [START ON 01/13/2014] pantoprazole (PROTONIX) IV  40 mg Intravenous Q12H    Continuous Infusions: . sodium chloride 1,000 mL (01/10/14  0503)  . pantoprozole (PROTONIX) infusion 8 mg/hr (01/10/14 0324)    Past Medical History  Diagnosis Date  . Hypertension   . Hyperlipidemia   . MS (multiple sclerosis)   . Low back pain   . Dyslipidemia   . Vitamin D deficiency   . Cancer     Past Surgical History  Procedure Laterality Date  . Esophagogastroduodenoscopy N/A 09/04/2012    Procedure: ESOPHAGOGASTRODUODENOSCOPY (EGD);  Surgeon: Jerene Bears, MD;  Location: [facility];  Service: Gastroenterology;  Laterality: N/A;  . Laparotomy N/A 09/07/2012    Procedure: EXPLORATORY LAPAROTOMY;   Surgeon: Leighton Ruff, MD;  Location: [facility];  Service: General;  Laterality: N/A;  . Partial gastrectomy N/A 09/07/2012    Procedure: PARTIAL GASTRECTOMY/RESECTION OF ABDOMINAL MASS;  Surgeon: Leighton Ruff, MD;  Location: [facility];  Service: General;  Laterality: N/A;    Atlee Abide MS RD LDN Clinical Dietitian UXLKG:[phone]

## 2014-01-10 NOTE — Progress Notes (Signed)
[facility] PROGRESS NOTE  Frankie Zito DXI:[id number] DOB: [birth year] DOA: 01/09/2014 PCP: Benito Mccreedy, MD Interim summary: Daina Cara is a 48 y.o. female with prior h/o GIST tumour s/p resection , follows up with Dr Learta Codding, comes in for worsening abdominal distention from the month of November associated with nausea, vomiting and abdominal pain. She was found to be guiac positive and anemic with a hemoglobin of 6.8 and in acute renal failure. She received 2 units of prbc transfusion and her hemoglobin improved to 8.4. CT abd pelvis was done showed multiple abdominal masses narrowing gastric lumen and compressing small bowel loops. Gastroenterology consulted and recommended guiac positive stools probably from hemorrhoids. No further intervention warranted. Dr Learta Codding consulted for recurrence of the GIST tumour.  Assessment/Plan:  Abdominal distention, nausea, vomiting and abdominal pain: secondary to recurrence of the gist tumour.  Further management as per Dr Learta Codding.    Anemia, : Probably from the tumour progression and mild hemorrhoidal bleeding.  - no hematemesis or malena.  - 2units of prbc transfusion ordered and repeat H&H shows improvement.  - IV PPI - gastroenterology on board.   Acute renal failure: - no evidence of hydronephrosis on CT. Probably a combination of hypoperfusion, and Dehydration, poor po intake and the use of ACEi nhibitors and from UTI.  - hydration, rocephin for UTI , urine electrolytes and stopping lisinopril. - Repeat renal parameters show improvement.   Hypotension improved after fluid boluses.   Code Status: presumed full code.  Family Communication: none at bedside Disposition Plan: plan to transfer her to telemetry.    Consultants:  Oncology  gastroenterology  Procedures:  CT abd and pelvis  Antibiotics:  none  HPI/Subjective: Feels the same,.  Objective: Filed Vitals:   01/10/14 0800  BP:   Pulse:   Temp:  97.7 F (36.5 C)  Resp:     Intake/Output Summary (Last 24 hours) at 01/10/14 1009 Last data filed at 01/10/14 0842  Gross per 24 hour  Intake 3632.5 ml  Output    400 ml  Net 3232.5 ml   Filed Weights   01/09/14 1730 01/10/14 0500  Weight: 97.1 kg (214 lb 1.1 oz) 97.1 kg (214 lb 1.1 oz)    Exam:   General:  Alert afebrile comfortable, laying in bed  Cardiovascular: s1s2  Respiratory: diminished air entry at bases  Abdomen: firm, abdominal distention, bowel sounds diminished, diffuse generalized tenderness  Musculoskeletal: trace pedal edema.   Data Reviewed: Basic Metabolic Panel:  Recent Labs Lab 01/09/14 1353 01/10/14 0350  NA 134* 136  K 3.9 3.6  CL 99 105  CO2 23 19  GLUCOSE 91 82  BUN 26* 25*  CREATININE 3.15* 2.65*  CALCIUM 9.3 8.6   Liver Function Tests:  Recent Labs Lab 01/09/14 1353 01/10/14 0350  AST 16 13  ALT 7 7  ALKPHOS 89 83  BILITOT 0.6 0.5  PROT 7.5 6.4  ALBUMIN 3.0* 2.6*   No results for input(s): LIPASE, AMYLASE in the last 168 hours. No results for input(s): AMMONIA in the last 168 hours. CBC:  Recent Labs Lab 01/09/14 1353 01/10/14 0350  WBC 9.1 8.6  NEUTROABS 7.7  --   HGB 6.8* 8.4*  HCT 22.1* 26.3*  MCV 86.7 86.5  PLT 513* 440*   Cardiac Enzymes: No results for input(s): CKTOTAL, CKMB, CKMBINDEX, TROPONINI in the last 168 hours. BNP (last 3 results) No results for input(s): PROBNP in the last 8760 hours. CBG: No results for input(s): GLUCAP in the last 168  hours.  Recent Results (from the past 240 hour(s))  MRSA PCR Screening     Status: None   Collection Time: 01/09/14  5:57 PM  Result Value Ref Range Status   MRSA by PCR NEGATIVE NEGATIVE Final    Comment:        The GeneXpert MRSA Assay (FDA approved for NASAL specimens only), is one component of a comprehensive MRSA colonization surveillance program. It is not intended to diagnose MRSA infection nor to guide or monitor treatment for MRSA  infections.      Studies: Ct Abdomen Pelvis Wo Contrast  01/09/2014   CLINICAL DATA:  Abdominal pain. Distention, bloating. GI bleeding and renal failure. History of GIST tumor.  EXAM: CT ABDOMEN AND PELVIS WITHOUT CONTRAST  TECHNIQUE: Multidetector CT imaging of the abdomen and pelvis was performed following the standard protocol without IV contrast.  COMPARISON:  CT of the abdomen and pelvis on 09/04/2012  FINDINGS: Lower chest: There is significant nodular right perihilar lung density suspicious for tumor or adenopathy. Partially imaged right lower lobe parenchymal opacity is also noted. Heart size is normal.  Upper abdomen: Numerous soft tissue masses filled the abdomen. Given the lack of intravenous contrast, is difficult to distinguish 1 mass from another. However large masses are seen inferior to the liver, extending into the upper pelvis measuring at least 24 cm. Large mass in the left upper quadrant appears contiguous to the spleen and measures 17 cm. Numerous anterior peritoneal masses are identified, measuring 1-7 cm in diameter. The gallbladder is present and contains a gallstone.  The large right mid abdominal mass displaces the right kidney inferiorly. Multiple bilateral renal masses are identified. Although these may represent cysts, they are not fully characterized on today's exam. Despite the history of acute renal failure there is no evidence for hydronephrosis on this noncontrast exam.  Gastrointestinal tract: The stomach is surrounded by numerous soft tissue masses with a very narrow contrast filled lumen. Surgical clips are identified in the region of the stomach. Small bowel loops are collapsed and partially contrast filled. Colonic loops are not yet filled with contrast at the time of exam. However, there does not appear to be bowel obstruction. No evidence for perforation or abscess.  Pelvis: Numerous soft tissue masses fill the pelvis, displacing the uterus and fallopian tubes  anteriorly. The largest pelvic mass measures 10.5 x 18.2 cm. The ovaries are not well seen. The urinary bladder is completely compressed by the large pelvic mass.  Retroperitoneum: Numerous mesenteric nodules versus mesenteric lymph nodes. The lack of intravenous contrast limits the detection of retroperitoneal lymph nodes. No significant bulky retroperitoneal adenopathy identified.  Abdominal wall: There is a supraumbilical soft tissue nodule which may be palpable measuring 3.0 cm in the midline. This may be accessible for percutaneous biopsy if tissue diagnosis is needed.  Osseous structures: No suspicious lytic or blastic lesions are identified.  IMPRESSION: 1. Numerous soft tissue masses filling the abdomen and pelvis consistent with recurrent tumor. 2. Soft tissue masses cause significant narrowing of the gastric lumen. Small and large bowel loops also appear narrow/compressed but do not appear to be obstructed at this time. 3. No evidence for bowel perforation or abscess. 4. Findings are suspicious for right hilar lymphadenopathy or tumor nodularity. Consider further evaluation with CT of the chest. Contrast administration is recommended if the patient is able to tolerate contrast. 5. Despite the history of renal failure, there is no obvious hydronephrosis. There are renal mass is which may be related  to cysts or tumor. 6. The salient findings were discussed with DAVID YAO on 01/09/2014 at 4:15 pm.   Electronically Signed   By: Shon Hale M.D.   On: 01/09/2014 16:33    Scheduled Meds: . cefTRIAXone (ROCEPHIN)  IV  1 g Intravenous Q24H  . feeding supplement (RESOURCE BREEZE)  1 Container Oral TID BM  . hydrocortisone  25 mg Rectal QHS  . ondansetron (ZOFRAN) IV  4 mg Intravenous Once  . [START ON 01/13/2014] pantoprazole (PROTONIX) IV  40 mg Intravenous Q12H   Continuous Infusions: . sodium chloride 1,000 mL (01/10/14 0503)  . pantoprozole (PROTONIX) infusion 8 mg/hr (01/10/14 0324)    Active  Problems:   GI bleed   Acute blood loss anemia   Protein-calorie malnutrition, severe   Malignant GIST   Acute renal failure   Abdominal distention    Time spent: 25 minutes    [facility] Pager [phone]. If 7PM-7AM, please contact night-coverage at [email or web address], password [facility] 01/10/2014, 10:09 AM  LOS: 1 day

## 2014-01-10 NOTE — Progress Notes (Signed)
Progress Note   Subjective  no specific complaints   Objective   Vital signs in last 24 hours: Temp:  [97.1 F (36.2 C)-98.8 F (37.1 C)] 97.7 F (36.5 C) (12/29 0800) Pulse Rate:  [89-102] 89 (12/29 0600) Resp:  [13-28] 13 (12/29 0600) BP: (105-159)/(49-91) 125/60 mmHg (12/29 0400) SpO2:  [91 %-99 %] 92 % (12/29 0600) Weight:  [214 lb 1.1 oz (97.1 kg)] 214 lb 1.1 oz (97.1 kg) (12/29 0500) Last BM Date: 01/09/14 General:    black female in NAD Abdomen:  Soft, distended, firm. Normal bowel sounds. .Neurologic:  Alert and oriented,  grossly normal neurologically. Psych:  Cooperative. Normal mood and affect.  Lab Results:  Recent Labs  01/09/14 1353 01/10/14 0350  WBC 9.1 8.6  HGB 6.8* 8.4*  HCT 22.1* 26.3*  PLT 513* 440*   BMET  Recent Labs  01/09/14 1353 01/10/14 0350  NA 134* 136  K 3.9 3.6  CL 99 105  CO2 23 19  GLUCOSE 91 82  BUN 26* 25*  CREATININE 3.15* 2.65*  CALCIUM 9.3 8.6   LFT  Recent Labs  01/10/14 0350  PROT 6.4  ALBUMIN 2.6*  AST 13  ALT 7  ALKPHOS 83  BILITOT 0.5    Studies/Results: Ct Abdomen Pelvis Wo Contrast  01/09/2014   CLINICAL DATA:  Abdominal pain. Distention, bloating. GI bleeding and renal failure. History of GIST tumor.  EXAM: CT ABDOMEN AND PELVIS WITHOUT CONTRAST  TECHNIQUE: Multidetector CT imaging of the abdomen and pelvis was performed following the standard protocol without IV contrast.  COMPARISON:  CT of the abdomen and pelvis on 09/04/2012  FINDINGS: Lower chest: There is significant nodular right perihilar lung density suspicious for tumor or adenopathy. Partially imaged right lower lobe parenchymal opacity is also noted. Heart size is normal.  Upper abdomen: Numerous soft tissue masses filled the abdomen. Given the lack of intravenous contrast, is difficult to distinguish 1 mass from another. However large masses are seen inferior to the liver, extending into the upper pelvis measuring at least 24 cm. Large mass  in the left upper quadrant appears contiguous to the spleen and measures 17 cm. Numerous anterior peritoneal masses are identified, measuring 1-7 cm in diameter. The gallbladder is present and contains a gallstone.  The large right mid abdominal mass displaces the right kidney inferiorly. Multiple bilateral renal masses are identified. Although these may represent cysts, they are not fully characterized on today's exam. Despite the history of acute renal failure there is no evidence for hydronephrosis on this noncontrast exam.  Gastrointestinal tract: The stomach is surrounded by numerous soft tissue masses with a very narrow contrast filled lumen. Surgical clips are identified in the region of the stomach. Small bowel loops are collapsed and partially contrast filled. Colonic loops are not yet filled with contrast at the time of exam. However, there does not appear to be bowel obstruction. No evidence for perforation or abscess.  Pelvis: Numerous soft tissue masses fill the pelvis, displacing the uterus and fallopian tubes anteriorly. The largest pelvic mass measures 10.5 x 18.2 cm. The ovaries are not well seen. The urinary bladder is completely compressed by the large pelvic mass.  Retroperitoneum: Numerous mesenteric nodules versus mesenteric lymph nodes. The lack of intravenous contrast limits the detection of retroperitoneal lymph nodes. No significant bulky retroperitoneal adenopathy identified.  Abdominal wall: There is a supraumbilical soft tissue nodule which may be palpable measuring 3.0 cm in the midline. This may be accessible for percutaneous  biopsy if tissue diagnosis is needed.  Osseous structures: No suspicious lytic or blastic lesions are identified.  IMPRESSION: 1. Numerous soft tissue masses filling the abdomen and pelvis consistent with recurrent tumor. 2. Soft tissue masses cause significant narrowing of the gastric lumen. Small and large bowel loops also appear narrow/compressed but do not  appear to be obstructed at this time. 3. No evidence for bowel perforation or abscess. 4. Findings are suspicious for right hilar lymphadenopathy or tumor nodularity. Consider further evaluation with CT of the chest. Contrast administration is recommended if the patient is able to tolerate contrast. 5. Despite the history of renal failure, there is no obvious hydronephrosis. There are renal mass is which may be related to cysts or tumor. 6. The salient findings were discussed with DAVID YAO on 01/09/2014 at 4:15 pm.   Electronically Signed   By: Shon Hale M.D.   On: 01/09/2014 16:33     Assessment / Plan:    84. 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 She is s/p partial gastrectomy / partial treatment with Gleevac. Now with recurrent tumor filling the abdomen and pelvis on non-contrast CTscan. Patient followed by Dr. Benay Spice who has apparently been by the room this am (per patient). Sounds like she may be restarting chemo.  2. Normocytic anemia. Some scant hemorrhoidal bleeding but no melena. Hgb up less than 3 grams (6.8 >>>8.4) after 3 units of blood. No evidence for significant GI bleed.  Recommend PPI for precautionary measures.   3. AKI, improving   4. Hemorrhoids. Started steroid suppositories yesterday.    LOS: 1 day   Tye Savoy  01/10/2014, 10:12 AM   Agree with Ms. Guenther's assessment and plan. Dr. Ammie Dalton anticipates attempting restarting Tx of GIST. She is not having a GI hemorrhage.  Please call us back if the need for our services arises again during this hospitalization. Signing off.  Gatha Mayer, MD, [facility] [phone]

## 2014-01-10 NOTE — Progress Notes (Signed)
Full report given to 5E RN. Pt stable for transport as per MD order.

## 2014-01-11 ENCOUNTER — Inpatient Hospital Stay (HOSPITAL_COMMUNITY): Payer: Medicaid Other

## 2014-01-11 ENCOUNTER — Other Ambulatory Visit: Payer: Self-pay | Admitting: *Deleted

## 2014-01-11 ENCOUNTER — Telehealth: Payer: Self-pay | Admitting: Nurse Practitioner

## 2014-01-11 DIAGNOSIS — N390 Urinary tract infection, site not specified: Secondary | ICD-10-CM | POA: Insufficient documentation

## 2014-01-11 DIAGNOSIS — C49A0 Gastrointestinal stromal tumor, unspecified site: Secondary | ICD-10-CM

## 2014-01-11 LAB — BASIC METABOLIC PANEL
ANION GAP: 11 (ref 5–15)
BUN: 22 mg/dL (ref 6–23)
CALCIUM: 8.4 mg/dL (ref 8.4–10.5)
CO2: 19 mmol/L (ref 19–32)
CREATININE: 1.96 mg/dL — AB (ref 0.50–1.10)
Chloride: 107 mEq/L (ref 96–112)
GFR, EST AFRICAN AMERICAN: 34 mL/min — AB (ref >90)
GFR, EST NON AFRICAN AMERICAN: 29 mL/min — AB (ref >90)
Glucose, Bld: 88 mg/dL (ref 70–99)
Potassium: 3.7 mmol/L (ref 3.5–5.1)
Sodium: 137 mmol/L (ref 135–145)

## 2014-01-11 LAB — CBC
HCT: 26.6 % — ABNORMAL LOW (ref 36.0–46.0)
Hemoglobin: 8.3 g/dL — ABNORMAL LOW (ref 12.0–15.0)
MCH: 27 pg (ref 26.0–34.0)
MCHC: 31.2 g/dL (ref 30.0–36.0)
MCV: 86.6 fL (ref 78.0–100.0)
Platelets: 441 10*3/uL — ABNORMAL HIGH (ref 150–400)
RBC: 3.07 MIL/uL — ABNORMAL LOW (ref 3.87–5.11)
RDW: 15.5 % (ref 11.5–15.5)
WBC: 8.5 10*3/uL (ref 4.0–10.5)

## 2014-01-11 LAB — PROTIME-INR
INR: 1.34 (ref 0.00–1.49)
Prothrombin Time: 16.7 seconds — ABNORMAL HIGH (ref 11.6–15.2)

## 2014-01-11 MED ORDER — FENTANYL CITRATE 0.05 MG/ML IJ SOLN
INTRAMUSCULAR | Status: AC | PRN
  Administered 2014-01-11: 50 ug via INTRAVENOUS

## 2014-01-11 MED ORDER — CEPHALEXIN 250 MG PO CAPS
250.0000 mg | ORAL_CAPSULE | Freq: Three times a day (TID) | ORAL | Status: DC
  Administered 2014-01-11 – 2014-01-12 (×4): 250 mg via ORAL
  Filled 2014-01-11 (×6): qty 1

## 2014-01-11 MED ORDER — MIDAZOLAM HCL 2 MG/2ML IJ SOLN
INTRAMUSCULAR | Status: AC | PRN
  Administered 2014-01-11: 0.5 mg via INTRAVENOUS
  Administered 2014-01-11: 1 mg via INTRAVENOUS
  Administered 2014-01-11: 0.5 mg via INTRAVENOUS

## 2014-01-11 MED ORDER — MIDAZOLAM HCL 2 MG/2ML IJ SOLN
INTRAMUSCULAR | Status: AC
  Filled 2014-01-11: qty 6

## 2014-01-11 MED ORDER — IMATINIB MESYLATE 100 MG PO TABS
200.0000 mg | ORAL_TABLET | Freq: Every day | ORAL | Status: DC
  Administered 2014-01-12: 200 mg via ORAL
  Filled 2014-01-11 (×3): qty 2

## 2014-01-11 MED ORDER — FENTANYL CITRATE 0.05 MG/ML IJ SOLN
INTRAMUSCULAR | Status: AC
  Filled 2014-01-11: qty 4

## 2014-01-11 MED ORDER — OXYCODONE HCL 5 MG PO TABS
5.0000 mg | ORAL_TABLET | ORAL | Status: DC | PRN
  Administered 2014-01-12: 10 mg via ORAL
  Filled 2014-01-11: qty 2

## 2014-01-11 MED ORDER — PANTOPRAZOLE SODIUM 40 MG PO TBEC
40.0000 mg | DELAYED_RELEASE_TABLET | Freq: Every morning | ORAL | Status: DC
  Administered 2014-01-12: 40 mg via ORAL
  Filled 2014-01-11: qty 1

## 2014-01-11 NOTE — Progress Notes (Signed)
IP PROGRESS NOTE  Subjective:   Monica [name] appears unchanged. She complains of discomfort related to abdominal distention.  Objective: Vital signs in last 24 hours: Blood pressure 121/71, pulse 95, temperature 98.2 F (36.8 C), temperature source Oral, resp. rate 18, height 5\' 8"  (1.727 m), weight 214 lb 1.1 oz (97.1 kg), last menstrual period 01/02/2014, SpO2 95 %.  Intake/Output from previous day: 12/29 0701 - 12/30 0700 In: -  Out: 100 [Urine:100]  Physical Exam:   Abdomen: Markedly distended with areas of firmness, no discrete mass Extremities: No leg edema     Lab Results:  Recent Labs  01/10/14 0350 01/11/14 0417  WBC 8.6 8.5  HGB 8.4* 8.3*  HCT 26.3* 26.6*  PLT 440* 441*    BMET  Recent Labs  01/10/14 0350 01/11/14 0417  NA 136 137  K 3.6 3.7  CL 105 107  CO2 19 19  GLUCOSE 82 88  BUN 25* 22  CREATININE 2.65* 1.96*  CALCIUM 8.6 8.4    Studies/Results: Ct Abdomen Pelvis Wo Contrast  01/09/2014   CLINICAL DATA:  Abdominal pain. Distention, bloating. GI bleeding and renal failure. History of GIST tumor.  EXAM: CT ABDOMEN AND PELVIS WITHOUT CONTRAST  TECHNIQUE: Multidetector CT imaging of the abdomen and pelvis was performed following the standard protocol without IV contrast.  COMPARISON:  CT of the abdomen and pelvis on 09/04/2012  FINDINGS: Lower chest: There is significant nodular right perihilar lung density suspicious for tumor or adenopathy. Partially imaged right lower lobe parenchymal opacity is also noted. Heart size is normal.  Upper abdomen: Numerous soft tissue masses filled the abdomen. Given the lack of intravenous contrast, is difficult to distinguish 1 mass from another. However large masses are seen inferior to the liver, extending into the upper pelvis measuring at least 24 cm. Large mass in the left upper quadrant appears contiguous to the spleen and measures 17 cm. Numerous anterior peritoneal masses are identified, measuring 1-7 cm in  diameter. The gallbladder is present and contains a gallstone.  The large right mid abdominal mass displaces the right kidney inferiorly. Multiple bilateral renal masses are identified. Although these may represent cysts, they are not fully characterized on today's exam. Despite the history of acute renal failure there is no evidence for hydronephrosis on this noncontrast exam.  Gastrointestinal tract: The stomach is surrounded by numerous soft tissue masses with a very narrow contrast filled lumen. Surgical clips are identified in the region of the stomach. Small bowel loops are collapsed and partially contrast filled. Colonic loops are not yet filled with contrast at the time of exam. However, there does not appear to be bowel obstruction. No evidence for perforation or abscess.  Pelvis: Numerous soft tissue masses fill the pelvis, displacing the uterus and fallopian tubes anteriorly. The largest pelvic mass measures 10.5 x 18.2 cm. The ovaries are not well seen. The urinary bladder is completely compressed by the large pelvic mass.  Retroperitoneum: Numerous mesenteric nodules versus mesenteric lymph nodes. The lack of intravenous contrast limits the detection of retroperitoneal lymph nodes. No significant bulky retroperitoneal adenopathy identified.  Abdominal wall: There is a supraumbilical soft tissue nodule which may be palpable measuring 3.0 cm in the midline. This may be accessible for percutaneous biopsy if tissue diagnosis is needed.  Osseous structures: No suspicious lytic or blastic lesions are identified.  IMPRESSION: 1. Numerous soft tissue masses filling the abdomen and pelvis consistent with recurrent tumor. 2. Soft tissue masses cause significant narrowing of the gastric  lumen. Small and large bowel loops also appear narrow/compressed but do not appear to be obstructed at this time. 3. No evidence for bowel perforation or abscess. 4. Findings are suspicious for right hilar lymphadenopathy or tumor  nodularity. Consider further evaluation with CT of the chest. Contrast administration is recommended if the patient is able to tolerate contrast. 5. Despite the history of renal failure, there is no obvious hydronephrosis. There are renal mass is which may be related to cysts or tumor. 6. The salient findings were discussed with Monica [name] on 01/09/2014 at 4:15 pm.   Electronically Signed   By: Shon Hale M.D.   On: 01/09/2014 16:33    Medications: I have reviewed the patient's current medications.  Assessment/Plan:  1. Gastrointestinal tumor of the stomach, high-grade, stage IIIB (T4 NX) disease, ruptured tumor, status post a partial gastrectomy 09/07/2012. She began adjuvant Gleevec 10/19/2012, dose reduced secondary to neutropenia, Gleevec discontinued 06/20/2013  CT of the abdomen and pelvis 01/09/2014 revealed numerous soft tissue masses in the abdomen and pelvis consistent with recurrent tumor. There is narrowing of the gastric and bowel lumen was without obstruction., Right hilar lymphadenopathy  2. Severe anemia-most likely related to chronic disease, renal failure, and chronic GI blood loss related to the recurrent tumor, stable  3.  History of multiple sclerosis-followed by Monica [name]  4.  History of neutropenia secondary to [facility]  Monica [name] appears to have massive recurrence of the gastrointestinal stromal tumor involving the abdomen and pelvis.  She is scheduled for a biopsy in interventional radiology today in order to confirm the diagnosis and obtain tissue for mutation testing.   Recommendations: 1. Proceed with biopsy of an abdominal mass today 2. Narcotic analgesics as needed for pain 3. Monitor hemoglobin and transfuse packed red blood cells as needed 4. Begin Gleevec today-I will dose reduce the Gleevec based on the current renal dysfunction  I will be out until 01/18/2013. Dr. Burr Medico will see her 01/12/2014. We will schedule outpatient follow-up at the Cancer center for  the week of 01/16/2013.       LOS: 2 days   Ihor Meinzer, Dominica Severin  01/11/2014, 8:37 AM

## 2014-01-11 NOTE — Progress Notes (Signed)
[facility] PROGRESS NOTE  Monica [name] UMP:[id number] DOB: 1965-04-22 DOA: 01/09/2014 PCP: Benito Mccreedy, MD   Interim summary: Monica [name] is a 48 y.o. female with prior h/o GIST tumour s/p resection , follows up with Dr Learta Codding, comes in for worsening abdominal distention since November associated with nausea, vomiting and abdominal pain. She was found to be guiac positive and anemic with a hemoglobin of 6.8 and in acute renal failure. She received 2 units of prbc transfusion and her hemoglobin improved to 8.4. CT abd pelvis was done showed multiple abdominal masses narrowing gastric lumen and compressing small bowel loops. Gastroenterology consulted and recommended guiac positive stools probably from hemorrhoids. No further intervention warranted. Dr Learta Codding consulted for recurrence of the GIST tumour.   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 Her oncologist is following. Recommendation is for biopsy of one of these masses to confirm the diagnosis. He has restarted the patient on Gleevac. Symptomatically, she has improved. She is tolerating diet. She is passing gas. Await biopsy.  Normocytic Anemia Probably from the tumour progression and mild hemorrhoidal bleeding. No hematemesis or malena. She status post 2units of prbc transfusion. Hemoglobin remained stable.  Acute renal failure: No evidence of hydronephrosis on CT.as was likely due to a combination of hypoperfusion, Dehydration, poor po intake and the use of ACE inhibitors. With hydration. Renal function is improved. She is making urine.  UTI Unfortunately no cultures were sent. WBC is normal. She is afebrile. Can be transitioned to oral.  Hypotension  Improved after fluid boluses.   DVT prophylaxis: SCDs Code Status: full code.  Family Communication: none at bedside Disposition Plan: Possible discharge tomorrow if biopsy is done today.     Consultants:  Oncology  Gastroenterology  IR  Procedures: Biopsy is pendng  Antibiotics: Ceftriaxone will be changed over to Keflex  Subjective: Feels better. Somewhat anxious. Pain is improved. Denies any nausea, vomiting. No diarrhea.   Objective: Filed Vitals:   01/11/14 0624  BP: 121/71  Pulse: 95  Temp: 98.2 F (36.8 C)  Resp: 18   No intake or output data in the 24 hours ending 01/11/14 1030 Filed Weights   01/09/14 1730 01/10/14 0500  Weight: 97.1 kg (214 lb 1.1 oz) 97.1 kg (214 lb 1.1 oz)    Exam:   General:  Alert afebrile comfortable, laying in bed  Cardiovascular: s1s2  Respiratory: diminished air entry at bases  Abdomen: Soft, abdominal distention, bowel sounds diminished, diffuse generalized tenderness without any rebound, rigidity or guarding.  Musculoskeletal: trace pedal edema.   Data Reviewed: Basic Metabolic Panel:  Recent Labs Lab 01/09/14 1353 01/10/14 0350 01/11/14 0417  NA 134* 136 137  K 3.9 3.6 3.7  CL 99 105 107  CO2 23 19 19   GLUCOSE 91 82 88  BUN 26* 25* 22  CREATININE 3.15* 2.65* 1.96*  CALCIUM 9.3 8.6 8.4   Liver Function Tests:  Recent Labs Lab 01/09/14 1353 01/10/14 0350  AST 16 13  ALT 7 7  ALKPHOS 89 83  BILITOT 0.6 0.5  PROT 7.5 6.4  ALBUMIN 3.0* 2.6*   CBC:  Recent Labs Lab 01/09/14 1353 01/10/14 0350 01/11/14 0417  WBC 9.1 8.6 8.5  NEUTROABS 7.7  --   --   HGB 6.8* 8.4* 8.3*  HCT 22.1* 26.3* 26.6*  MCV 86.7 86.5 86.6  PLT 513* 440* 441*    Recent Results (from the past 240 hour(s))  MRSA PCR Screening  Status: None   Collection Time: 01/09/14  5:57 PM  Result Value Ref Range Status   MRSA by PCR NEGATIVE NEGATIVE Final    Comment:        The GeneXpert MRSA Assay (FDA approved for NASAL specimens only), is one component of a comprehensive MRSA colonization surveillance program. It is not intended to diagnose MRSA infection nor to guide or monitor treatment for MRSA  infections.      Studies: Ct Abdomen Pelvis Wo Contrast  01/09/2014   CLINICAL DATA:  Abdominal pain. Distention, bloating. GI bleeding and renal failure. History of GIST tumor.  EXAM: CT ABDOMEN AND PELVIS WITHOUT CONTRAST  TECHNIQUE: Multidetector CT imaging of the abdomen and pelvis was performed following the standard protocol without IV contrast.  COMPARISON:  CT of the abdomen and pelvis on 09/04/2012  FINDINGS: Lower chest: There is significant nodular right perihilar lung density suspicious for tumor or adenopathy. Partially imaged right lower lobe parenchymal opacity is also noted. Heart size is normal.  Upper abdomen: Numerous soft tissue masses filled the abdomen. Given the lack of intravenous contrast, is difficult to distinguish 1 mass from another. However large masses are seen inferior to the liver, extending into the upper pelvis measuring at least 24 cm. Large mass in the left upper quadrant appears contiguous to the spleen and measures 17 cm. Numerous anterior peritoneal masses are identified, measuring 1-7 cm in diameter. The gallbladder is present and contains a gallstone.  The large right mid abdominal mass displaces the right kidney inferiorly. Multiple bilateral renal masses are identified. Although these may represent cysts, they are not fully characterized on today's exam. Despite the history of acute renal failure there is no evidence for hydronephrosis on this noncontrast exam.  Gastrointestinal tract: The stomach is surrounded by numerous soft tissue masses with a very narrow contrast filled lumen. Surgical clips are identified in the region of the stomach. Small bowel loops are collapsed and partially contrast filled. Colonic loops are not yet filled with contrast at the time of exam. However, there does not appear to be bowel obstruction. No evidence for perforation or abscess.  Pelvis: Numerous soft tissue masses fill the pelvis, displacing the uterus and fallopian tubes  anteriorly. The largest pelvic mass measures 10.5 x 18.2 cm. The ovaries are not well seen. The urinary bladder is completely compressed by the large pelvic mass.  Retroperitoneum: Numerous mesenteric nodules versus mesenteric lymph nodes. The lack of intravenous contrast limits the detection of retroperitoneal lymph nodes. No significant bulky retroperitoneal adenopathy identified.  Abdominal wall: There is a supraumbilical soft tissue nodule which may be palpable measuring 3.0 cm in the midline. This may be accessible for percutaneous biopsy if tissue diagnosis is needed.  Osseous structures: No suspicious lytic or blastic lesions are identified.  IMPRESSION: 1. Numerous soft tissue masses filling the abdomen and pelvis consistent with recurrent tumor. 2. Soft tissue masses cause significant narrowing of the gastric lumen. Small and large bowel loops also appear narrow/compressed but do not appear to be obstructed at this time. 3. No evidence for bowel perforation or abscess. 4. Findings are suspicious for right hilar lymphadenopathy or tumor nodularity. Consider further evaluation with CT of the chest. Contrast administration is recommended if the patient is able to tolerate contrast. 5. Despite the history of renal failure, there is no obvious hydronephrosis. There are renal mass is which may be related to cysts or tumor. 6. The salient findings were discussed with DAVID YAO on 01/09/2014 at 4:15  pm.   Electronically Signed   By: Shon Hale M.D.   On: 01/09/2014 16:33    Scheduled Meds: . cefTRIAXone (ROCEPHIN)  IV  1 g Intravenous Q24H  . feeding supplement (RESOURCE BREEZE)  1 Container Oral TID BM  . hydrocortisone  25 mg Rectal QHS  . imatinib  200 mg Oral Q breakfast  . ondansetron (ZOFRAN) IV  4 mg Intravenous Once  . [START ON 01/13/2014] pantoprazole (PROTONIX) IV  40 mg Intravenous Q12H   Continuous Infusions: . sodium chloride 100 mL/hr at 01/11/14 0425  . pantoprozole (PROTONIX) infusion 8  mg/hr (01/11/14 0425)    Active Problems:   GI bleed   Acute blood loss anemia   Protein-calorie malnutrition, severe   Malignant GIST   Acute renal failure   Abdominal distention   Time spent: 25 minutes   [facility] Pager [phone].   If 7PM-7AM, please contact night-coverage at [email or web address], password [facility] 01/11/2014, 10:30 AM  LOS: 2 days

## 2014-01-11 NOTE — Procedures (Signed)
Interventional Radiology Procedure Note  Procedure: US guided biopsy of abdominal mass.  3 x 18 G core.  Complications: No immediate Recommendations:  - Ok to shower tomorrow - Do not submerge for 7 days - follow up result   Signed,  Dulcy Fanny. Earleen Newport, DO

## 2014-01-11 NOTE — Evaluation (Signed)
Physical Therapy Evaluation Patient Details Name: Monica [name] MRN: [id number] DOB: 06-15-1965 Today's Date: 01/11/2014   History of Present Illness  Monica [name] is a 48 y.o. female with prior h/o GIST tumour s/p resection , follows up with Dr Learta Codding, comes in for worsening abdominal distention since November associated with nausea, vomiting and abdominal pain. She was found to be guiac positive and anemic with a hemoglobin of 6.8 and in acute renal failure. She received 2 units of prbc transfusion and her hemoglobin improved to 8.4. CT abd pelvis was done showed multiple abdominal masses narrowing gastric lumen and compressing small bowel loops. Gastroenterology consulted and recommended guiac positive stools probably from hemorrhoids. No further intervention warranted. Dr Learta Codding consulted for recurrence of the GIST tumour.    Clinical Impression  Pt admitted with above diagnosis. Pt currently with functional limitations due to the deficits listed below (see PT Problem List).  Pt will benefit from skilled PT to increase their independence and safety with mobility to allow discharge to the venue listed below. PT eval limited by pt fatigue due to being hungry as she was awaiting test.  Based on gait in room, do not see any significant balance deficits or issues and do not feel like she will need PT after acute care.       Follow Up Recommendations No PT follow up    Equipment Recommendations  None recommended by PT    Recommendations for Other Services       Precautions / Restrictions        Mobility  Bed Mobility Overal bed mobility: Modified Independent             General bed mobility comments: with rails and HOB elevated  Transfers Overall transfer level: Modified independent                  Ambulation/Gait Ambulation/Gait assistance: Supervision Ambulation Distance (Feet): 30 Feet Assistive device: None Gait Pattern/deviations: Wide base of support      General Gait Details: Pt declined ambulation in halls due to being unable to eat for a test and feeling very fatigued.  Wide BOS with waddling type gait due to increased abd girth from medical issues.  Stairs            Wheelchair Mobility    Modified Rankin (Stroke Patients Only)       Balance Overall balance assessment: No apparent balance deficits (not formally assessed)                                           Pertinent Vitals/Pain Pain Assessment: No/denies pain    Home Living Family/patient expects to be discharged to:: Private residence Living Arrangements: Alone   Type of Home: Apartment Home Access: Level entry     Home Layout: One level Home Equipment: None      Prior Function Level of Independence: Independent               Hand Dominance   Dominant Hand: Right    Extremity/Trunk Assessment   Upper Extremity Assessment: Defer to OT evaluation           Lower Extremity Assessment: Overall WFL for tasks assessed         Communication      Cognition Arousal/Alertness: Awake/alert Behavior During Therapy: WFL for tasks assessed/performed Overall Cognitive Status: Within Functional Limits for tasks  assessed                      General Comments      Exercises        Assessment/Plan    PT Assessment Patient needs continued PT services  PT Diagnosis Difficulty walking   PT Problem List Decreased activity tolerance;Decreased mobility  PT Treatment Interventions Gait training;Functional mobility training;Therapeutic activities;Therapeutic exercise   PT Goals (Current goals can be found in the Care Plan section) Acute Rehab PT Goals Patient Stated Goal: Eat a hamburger so she can have some energy PT Goal Formulation: With patient Time For Goal Achievement: 01/18/14 Potential to Achieve Goals: Good    Frequency Min 2X/week   Barriers to discharge        Co-evaluation                End of Session   Activity Tolerance: Patient limited by fatigue Patient left: in bed Nurse Communication: Mobility status         Time: 1355-1409 PT Time Calculation (min) (ACUTE ONLY): 14 min   Charges:   PT Evaluation $Initial PT Evaluation Tier I: 1 Procedure PT Treatments $Gait Training: 8-22 mins   PT G Codes:        Nyan Dufresne LUBECK 01/11/2014, 2:29 PM

## 2014-01-11 NOTE — Telephone Encounter (Signed)
LM to confirm appt for 01/20/14.

## 2014-01-12 ENCOUNTER — Telehealth: Payer: Self-pay | Admitting: Nurse Practitioner

## 2014-01-12 ENCOUNTER — Other Ambulatory Visit: Payer: Self-pay | Admitting: Hematology

## 2014-01-12 LAB — BASIC METABOLIC PANEL
Anion gap: 9 (ref 5–15)
BUN: 19 mg/dL (ref 6–23)
CALCIUM: 8.4 mg/dL (ref 8.4–10.5)
CO2: 20 mmol/L (ref 19–32)
Chloride: 107 mEq/L (ref 96–112)
Creatinine, Ser: 1.7 mg/dL — ABNORMAL HIGH (ref 0.50–1.10)
GFR calc non Af Amer: 35 mL/min — ABNORMAL LOW (ref >90)
GFR, EST AFRICAN AMERICAN: 40 mL/min — AB (ref >90)
GLUCOSE: 89 mg/dL (ref 70–99)
Potassium: 3.8 mmol/L (ref 3.5–5.1)
Sodium: 136 mmol/L (ref 135–145)

## 2014-01-12 LAB — CBC
HCT: 27.7 % — ABNORMAL LOW (ref 36.0–46.0)
Hemoglobin: 8.7 g/dL — ABNORMAL LOW (ref 12.0–15.0)
MCH: 27.3 pg (ref 26.0–34.0)
MCHC: 31.4 g/dL (ref 30.0–36.0)
MCV: 86.8 fL (ref 78.0–100.0)
Platelets: 435 10*3/uL — ABNORMAL HIGH (ref 150–400)
RBC: 3.19 MIL/uL — AB (ref 3.87–5.11)
RDW: 15.3 % (ref 11.5–15.5)
WBC: 7.5 10*3/uL (ref 4.0–10.5)

## 2014-01-12 MED ORDER — CEPHALEXIN 250 MG PO CAPS: 250.0000 mg | ORAL_CAPSULE | Freq: Three times a day (TID) | ORAL | Status: DC

## 2014-01-12 MED ORDER — DOCUSATE SODIUM 100 MG PO CAPS: 100.0000 mg | ORAL_CAPSULE | Freq: Two times a day (BID) | ORAL | Status: DC

## 2014-01-12 MED ORDER — ENSURE COMPLETE PO LIQD: 237.0000 mL | Freq: Two times a day (BID) | ORAL | Status: DC

## 2014-01-12 MED ORDER — IMATINIB MESYLATE 100 MG PO TABS: 100.0000 mg | ORAL_TABLET | Freq: Every day | ORAL | Status: DC

## 2014-01-12 MED ORDER — POLYETHYLENE GLYCOL 3350 17 G PO PACK: 17.0000 g | PACK | Freq: Every day | ORAL | Status: DC | PRN

## 2014-01-12 MED ORDER — OXYCODONE HCL 5 MG PO TABS: 5.0000 mg | ORAL_TABLET | ORAL | Status: DC | PRN

## 2014-01-12 MED ORDER — IMATINIB MESYLATE 100 MG PO TABS: 200.0000 mg | ORAL_TABLET | Freq: Every day | ORAL | Status: DC

## 2014-01-12 NOTE — Progress Notes (Signed)
Discharge instructions and medications reviewed with patient. Patient verbalizes understanding and has no questions at this time. Patient confirms she has all personal belongings in her possession. Pt discharged home.

## 2014-01-12 NOTE — Evaluation (Signed)
Occupational Therapy Evaluation Patient Details Name: Monica [name] MRN: [id number] DOB: 1965-01-16 Today's Date: 01/12/2014    History of Present Illness Monica [name] is a 48 y.o. female with prior h/o GIST tumour s/p resection , follows up with Dr Learta Codding, comes in for worsening abdominal distention since November associated with nausea, vomiting and abdominal pain. She was found to be guiac positive and anemic with a hemoglobin of 6.8 and in acute renal failure. She received 2 units of prbc transfusion and her hemoglobin improved to 8.4. CT abd pelvis was done showed multiple abdominal masses narrowing gastric lumen and compressing small bowel loops. Gastroenterology consulted and recommended guiac positive stools probably from hemorrhoids. No further intervention warranted. Dr Learta Codding consulted for recurrence of the GIST tumour.     Clinical Impression   Pt currently min assist without Ae for LB self care. Introduced AE to assist with LB tasks and pt pleased with options. Issued AE kit. Pt up in room at supervision level. Will follow to reinforce AE education and followup on any other ADL needs.    Follow Up Recommendations  No OT follow up    Equipment Recommendations  Tub/shower seat (if covered by Medicaid)    Recommendations for Other Services       Precautions / Restrictions Precautions Precautions: None Restrictions Weight Bearing Restrictions: No      Mobility Bed Mobility Overal bed mobility: Modified Independent                Transfers Overall transfer level: Modified independent                    Balance                                            ADL Overall ADL's : Needs assistance/impaired Eating/Feeding: Independent;Sitting   Grooming: Wash/dry hands;Set up;Sitting   Upper Body Bathing: Set up;Sitting   Lower Body Bathing: Minimal assistance;Sit to/from stand   Upper Body Dressing : Set up;Sitting   Lower Body  Dressing: Minimal assistance;Sit to/from stand   Toilet Transfer: Copy Details (indicate cue type and reason): around the room and back to bed. she declined need to go to the bathroom Toileting- Water quality scientist and Hygiene: Supervision/safety;Sit to/from stand         General ADL Comments: Pt with discomfort trying to reach down to feet due to abdominal distention so educated on AE options for LB self care and pt practiced with it. She is interested in using AE so issued her an AE kit. Pt did well with reacher to doff sock and min cues and min assist to don sock with sock aid. demonstrated LHS and shoe horn also. Discussed a tubseat as pt states she gets fatigued with showering even PTA. Also discussed waiting initially to shower due to her abdominal discomfort with bending LEs up to decrease fall risk. Discussed seat option for when she does feel ready to shower.      Vision                     Perception     Praxis      Pertinent Vitals/Pain Pain Assessment: No/denies pain     Hand Dominance Right   Extremity/Trunk Assessment Upper Extremity Assessment Upper Extremity Assessment: Overall WFL for tasks assessed  Communication     Cognition Arousal/Alertness: Awake/alert Behavior During Therapy: WFL for tasks assessed/performed Overall Cognitive Status: Within Functional Limits for tasks assessed                     General Comments       Exercises       Shoulder Instructions      Home Living Family/patient expects to be discharged to:: Private residence Living Arrangements: Alone   Type of Home: Apartment Home Access: Level entry     Home Layout: One level     Bathroom Shower/Tub: Teacher, early years/pre: Standard     Home Equipment: None          Prior Functioning/Environment Level of Independence: Independent        Comments: states she doesnt cook much    OT  Diagnosis: Generalized weakness   OT Problem List: Decreased strength;Decreased knowledge of use of DME or AE   OT Treatment/Interventions: Self-care/ADL training;Patient/family education;Therapeutic activities;DME and/or AE instruction    OT Goals(Current goals can be found in the care plan section) Acute Rehab OT Goals Patient Stated Goal: abdomen to reduce in size OT Goal Formulation: With patient Time For Goal Achievement: 01/26/14 Potential to Achieve Goals: Good  OT Frequency: Min 2X/week   Barriers to D/C:            Co-evaluation              End of Session    Activity Tolerance: Patient tolerated treatment well Patient left: in bed   Time: 0950-1010 OT Time Calculation (min): 20 min Charges:  OT General Charges $OT Visit: 1 Procedure OT Evaluation $Initial OT Evaluation Tier I: 1 Procedure OT Treatments $Self Care/Home Management : 8-22 mins G-Codes:    Jules Schick  [phone] 01/12/2014, 10:25 AM

## 2014-01-12 NOTE — Telephone Encounter (Signed)
Return pt call from vm, pt confirmed appt 01/20/14

## 2014-01-12 NOTE — Discharge Instructions (Signed)
Dehydration, Adult Dehydration is when you lose more fluids from the body than you take in. Vital organs like the kidneys, brain, and heart cannot function without a proper amount of fluids and salt. Any loss of fluids from the body can cause dehydration.  CAUSES   Vomiting.  Diarrhea.  Excessive sweating.  Excessive urine output.  Fever. SYMPTOMS  Mild dehydration  Thirst.  Dry lips.  Slightly dry mouth. Moderate dehydration  Very dry mouth.  Sunken eyes.  Skin does not bounce back quickly when lightly pinched and released.  Dark urine and decreased urine production.  Decreased tear production.  Headache. Severe dehydration  Very dry mouth.  Extreme thirst.  Rapid, weak pulse (more than 100 beats per minute at rest).  Cold hands and feet.  Not able to sweat in spite of heat and temperature.  Rapid breathing.  Blue lips.  Confusion and lethargy.  Difficulty being awakened.  Minimal urine production.  No tears. DIAGNOSIS  Your caregiver will diagnose dehydration based on your symptoms and your exam. Blood and urine tests will help confirm the diagnosis. The diagnostic evaluation should also identify the cause of dehydration. TREATMENT  Treatment of mild or moderate dehydration can often be done at home by increasing the amount of fluids that you drink. It is best to drink small amounts of fluid more often. Drinking too much at one time can make vomiting worse. Refer to the home care instructions below. Severe dehydration needs to be treated at the hospital where you will probably be given intravenous (IV) fluids that contain water and electrolytes. HOME CARE INSTRUCTIONS   Ask your caregiver about specific rehydration instructions.  Drink enough fluids to keep your urine clear or pale yellow.  Drink small amounts frequently if you have nausea and vomiting.  Eat as you normally do.  Avoid:  Foods or drinks high in sugar.  Carbonated  drinks.  Juice.  Extremely hot or cold fluids.  Drinks with caffeine.  Fatty, greasy foods.  Alcohol.  Tobacco.  Overeating.  Gelatin desserts.  Wash your hands well to avoid spreading bacteria and viruses.  Only take over-the-counter or prescription medicines for pain, discomfort, or fever as directed by your caregiver.  Ask your caregiver if you should continue all prescribed and over-the-counter medicines.  Keep all follow-up appointments with your caregiver. SEEK MEDICAL CARE IF:  You have abdominal pain and it increases or stays in one area (localizes).  You have a rash, stiff neck, or severe headache.  You are irritable, sleepy, or difficult to awaken.  You are weak, dizzy, or extremely thirsty. SEEK IMMEDIATE MEDICAL CARE IF:   You are unable to keep fluids down or you get worse despite treatment.  You have frequent episodes of vomiting or diarrhea.  You have blood or green matter (bile) in your vomit.  You have blood in your stool or your stool looks black and tarry.  You have not urinated in 6 to 8 hours, or you have only urinated a small amount of very dark urine.  You have a fever.  You faint. MAKE SURE YOU:   Understand these instructions.  Will watch your condition.  Will get help right away if you are not doing well or get worse. Document Released: 12/30/2004 Document Revised: 03/24/2011 Document Reviewed: 08/19/2010 Exit[employer] Patient Information 2015 [employer]. This information is not intended to replace advice given to you by your health care provider. Make sure you discuss any questions you have with your health care   provider.  

## 2014-01-12 NOTE — Discharge Summary (Addendum)
[facility]  Physician Discharge Summary   Patient ID: Monica [name] MRN: [id number] DOB/AGE: 1965/05/04 48 y.o.  Admit date: 01/09/2014 Discharge date: 01/12/2014  PCP: Benito Mccreedy, MD  DISCHARGE DIAGNOSES:  Active Problems:   GI bleed   Acute blood loss anemia   Protein-calorie malnutrition, severe   Malignant GIST   Acute renal failure   Abdominal distention   GIST, malignant   UTI (lower urinary tract infection)   RECOMMENDATIONS FOR OUTPATIENT FOLLOW UP: 1. Home Health will be set up  DISCHARGE CONDITION: fair  Diet recommendation: Regular  Filed Weights   01/09/14 1730 01/10/14 0500  Weight: 97.1 kg (214 lb 1.1 oz) 97.1 kg (214 lb 1.1 oz)    INITIAL HISTORY: Monica [name] is a 48 y.o. female with prior h/o GIST tumour s/p resection, follows up with Dr Learta Codding. She presented with worsening abdominal distention since November associated with nausea, vomiting and abdominal pain. She was found to be guiac positive and anemic with a hemoglobin of 6.8 and in acute renal failure. She received 2 units of prbc transfusion and her hemoglobin improved to 8.4. CT abd pelvis was done showed multiple abdominal masses narrowing gastric lumen and compressing small bowel loops. Gastroenterology consulted and stated that guiac positive stools probably from hemorrhoids. No further intervention warranted. Dr Learta Codding consulted for recurrence of the GIST tumour.   Consultants:  Oncology  Gastroenterology  IR  Procedures: Ultrasound-guided biopsy of the intra-abdominal tumor   HOSPITAL COURSE:   Abdominal pain and distention/Recurrence of GIST This was thought to be secondary to recurrence of the gist tumour. Her oncologist was consulted. A repeat biopsy was recommended to confirm diagnosis. This biopsy was done yesterday. Pathology does confirm recurrence of the tumor. Patient was started again on [place]. Discussed with Dr. Burr Medico today. She is covering for Dr.  Learta Codding. Medication will be available for pickup at the outpatient pharmacy. Patient to follow-up with Dr. Learta Codding  next week. She had been prescribed pain medications. Stool softeners and laxative is also being prescribed. Prognosis is uncertain.  Normocytic Anemia Probably from the tumour progression and mild hemorrhoidal bleeding. No hematemesis or malena. She is status post 2units of prbc transfusion. Hemoglobin remains stable.  Acute renal failure: This was secondary to multiple episodes of nausea and vomiting. She also had poor oral intake. She was also an ACE inhibitor. With IV fluids renal function is improved. At this time, we will recommend discontinuing NSAIDs and the ACE inhibitor. Renal function can be followed up as an outpatient.  UTI Unfortunately no cultures were sent. WBC is normal. She is afebrile. She was transitioned to oral Keflex from ceftriaxone. She'll be prescribed for 5 more days.  Hypotension  Blood pressure has improved after fluid boluses. Hold her antihypertensive agents. Home health RN to follow-up.  Patient has ambulated with physical and occupational therapy. She is tolerating orally. Appetite is poor. Prognosis is likely poor as well. She is stable for discharge. She'll have close follow-up with her oncologist. She's been told to get in touch with them in case she has worsening pain or other symptoms.   PERTINENT LABS:  The results of significant diagnostics from this hospitalization (including imaging, microbiology, ancillary and laboratory) are listed below for reference.    Microbiology: Recent Results (from the past 240 hour(s))  MRSA PCR Screening     Status: None   Collection Time: 01/09/14  5:57 PM  Result Value Ref Range Status   MRSA by PCR NEGATIVE NEGATIVE Final  Comment:        The GeneXpert MRSA Assay (FDA approved for NASAL specimens only), is one component of a comprehensive MRSA colonization surveillance program. It is  not intended to diagnose MRSA infection nor to guide or monitor treatment for MRSA infections.      Labs: Basic Metabolic Panel:  Recent Labs Lab 01/09/14 1353 01/10/14 0350 01/11/14 0417 01/12/14 0515  NA 134* 136 137 136  K 3.9 3.6 3.7 3.8  CL 99 105 107 107  CO2 23 19 19 20   GLUCOSE 91 82 88 89  BUN 26* 25* 22 19  CREATININE 3.15* 2.65* 1.96* 1.70*  CALCIUM 9.3 8.6 8.4 8.4   Liver Function Tests:  Recent Labs Lab 01/09/14 1353 01/10/14 0350  AST 16 13  ALT 7 7  ALKPHOS 89 83  BILITOT 0.6 0.5  PROT 7.5 6.4  ALBUMIN 3.0* 2.6*   CBC:  Recent Labs Lab 01/09/14 1353 01/10/14 0350 01/11/14 0417 01/12/14 0515  WBC 9.1 8.6 8.5 7.5  NEUTROABS 7.7  --   --   --   HGB 6.8* 8.4* 8.3* 8.7*  HCT 22.1* 26.3* 26.6* 27.7*  MCV 86.7 86.5 86.6 86.8  PLT 513* 440* 441* 435*     IMAGING STUDIES Ct Abdomen Pelvis Wo Contrast  01/09/2014   CLINICAL DATA:  Abdominal pain. Distention, bloating. GI bleeding and renal failure. History of GIST tumor.  EXAM: CT ABDOMEN AND PELVIS WITHOUT CONTRAST  TECHNIQUE: Multidetector CT imaging of the abdomen and pelvis was performed following the standard protocol without IV contrast.  COMPARISON:  CT of the abdomen and pelvis on 09/04/2012  FINDINGS: Lower chest: There is significant nodular right perihilar lung density suspicious for tumor or adenopathy. Partially imaged right lower lobe parenchymal opacity is also noted. Heart size is normal.  Upper abdomen: Numerous soft tissue masses filled the abdomen. Given the lack of intravenous contrast, is difficult to distinguish 1 mass from another. However large masses are seen inferior to the liver, extending into the upper pelvis measuring at least 24 cm. Large mass in the left upper quadrant appears contiguous to the spleen and measures 17 cm. Numerous anterior peritoneal masses are identified, measuring 1-7 cm in diameter. The gallbladder is present and contains a gallstone.  The large right  mid abdominal mass displaces the right kidney inferiorly. Multiple bilateral renal masses are identified. Although these may represent cysts, they are not fully characterized on today's exam. Despite the history of acute renal failure there is no evidence for hydronephrosis on this noncontrast exam.  Gastrointestinal tract: The stomach is surrounded by numerous soft tissue masses with a very narrow contrast filled lumen. Surgical clips are identified in the region of the stomach. Small bowel loops are collapsed and partially contrast filled. Colonic loops are not yet filled with contrast at the time of exam. However, there does not appear to be bowel obstruction. No evidence for perforation or abscess.  Pelvis: Numerous soft tissue masses fill the pelvis, displacing the uterus and fallopian tubes anteriorly. The largest pelvic mass measures 10.5 x 18.2 cm. The ovaries are not well seen. The urinary bladder is completely compressed by the large pelvic mass.  Retroperitoneum: Numerous mesenteric nodules versus mesenteric lymph nodes. The lack of intravenous contrast limits the detection of retroperitoneal lymph nodes. No significant bulky retroperitoneal adenopathy identified.  Abdominal wall: There is a supraumbilical soft tissue nodule which may be palpable measuring 3.0 cm in the midline. This may be accessible for percutaneous biopsy if tissue  diagnosis is needed.  Osseous structures: No suspicious lytic or blastic lesions are identified.  IMPRESSION: 1. Numerous soft tissue masses filling the abdomen and pelvis consistent with recurrent tumor. 2. Soft tissue masses cause significant narrowing of the gastric lumen. Small and large bowel loops also appear narrow/compressed but do not appear to be obstructed at this time. 3. No evidence for bowel perforation or abscess. 4. Findings are suspicious for right hilar lymphadenopathy or tumor nodularity. Consider further evaluation with CT of the chest. Contrast  administration is recommended if the patient is able to tolerate contrast. 5. Despite the history of renal failure, there is no obvious hydronephrosis. There are renal mass is which may be related to cysts or tumor. 6. The salient findings were discussed with DAVID YAO on 01/09/2014 at 4:15 pm.   Electronically Signed   By: Shon Hale M.D.   On: 01/09/2014 16:33   US Biopsy  01/11/2014   CLINICAL DATA:  48 year old female with a history of multiple abdominal masses  EXAM: ULTRASOUND GUIDED CORE BIOPSY OF ABDOMINAL MASS  : MEDICATIONS:  Fentanyl 2.0 mcg IV; Versed 50 mg IV  Moderate sedation time: 8 min  PROCEDURE: The procedure, risks, benefits, and alternatives were explained to the patient. Questions regarding the procedure were encouraged and answered. The patient understands and consents to the procedure.  The patient is in the supine position on the ultrasound gantry table, and ultrasound images of the upper abdomen were performed. Superficial lesion identified in the subxiphoid region, similar to CT findings.  The subxiphoid region was prepped with Betadine in a sterile fashion, and a sterile drape was applied covering the operative field. A sterile gown and sterile gloves were used for the procedure. Local anesthesia was provided with 1% Lidocaine.  Once the patient was prepped and draped sterilely and the skin and subcutaneous tissues were generously infiltrated with 1% lidocaine, a small stab incision was made with 11 blade scalpel, and a 18 gauge biopsy gun was advanced into a focal lesion in the upper abdomen. Three separate 18 gauge core biopsy were retrieved under ultrasound guidance.  The tissue specimen placed into formalin for transportation to the lab.  The patient tolerated the procedure well and remained hemodynamically stable throughout.  No complications were encountered and no significant blood loss was encountered.  COMPLICATIONS: None.  FINDINGS: Multiple hypoechoic solid lesions of the  abdomen.  The lesion that was targeted was surveyed with ultrasound to identified the margins. This lesion is confidently targeted as a separate lesion from the liver, focally within the upper abdomen corresponding to the findings on the prior CT.  Completion ultrasound survey demonstrates no fluid around the lesion to indicate bleeding.  IMPRESSION: Status post ultrasound biopsy of upper abdominal mass, with tissue specimen sent to pathology for complete histopathologic analysis.  Signed,  Dulcy Fanny. Earleen Newport, DO  [facility]  [facility]  ANESTHESIA/SEDATION: Eight minutes   Electronically Signed   By: Corrie Mckusick D.O.   On: 01/11/2014 17:13    DISCHARGE EXAMINATION: Filed Vitals:   01/11/14 1545 01/11/14 1552 01/11/14 2121 01/12/14 0517  BP: 116/75 116/80 121/82 126/79  Pulse: 95 93 95 89  Temp:   98.7 F (37.1 C) 98.8 F (37.1 C)  TempSrc:   Oral Oral  Resp: 19 18 16 16   Height:      Weight:      SpO2: 96% 97% 96% 97%   General appearance: alert, cooperative, appears stated age and no  distress Resp: clear to auscultation bilaterally Cardio: regular rate and rhythm, S1, S2 normal, no murmur, click, rub or gallop GI: Abdomen is distended. Mildly Tender diffusely without any rebound, rigidity or guarding. No masses or organomegaly.  DISPOSITION: Home  Discharge Instructions    Call MD for:  difficulty breathing, headache or visual disturbances    Complete by:  As directed      Call MD for:  extreme fatigue    Complete by:  As directed      Call MD for:  persistant dizziness or light-headedness    Complete by:  As directed      Call MD for:  persistant nausea and vomiting    Complete by:  As directed      Call MD for:  severe uncontrolled pain    Complete by:  As directed      Call MD for:  temperature >100.4    Complete by:  As directed      Diet - low sodium heart healthy    Complete by:  As directed      Discharge instructions     Complete by:  As directed   Please call Dr. Gearldine Shown office for further issues with pain.     Increase activity slowly    Complete by:  As directed            ALLERGIES: No Known Allergies   Current Discharge Medication List    START taking these medications   Details  cephALEXin (KEFLEX) 250 MG capsule Take 1 capsule (250 mg total) by mouth every 8 (eight) hours. For 5 more days. Qty: 15 capsule, Refills: 0    docusate sodium (COLACE) 100 MG capsule Take 1 capsule (100 mg total) by mouth 2 (two) times daily. Qty: 60 capsule, Refills: 0    oxyCODONE (OXY IR/ROXICODONE) 5 MG immediate release tablet Take 1-2 tablets (5-10 mg total) by mouth every 4 (four) hours as needed for severe pain. Qty: 30 tablet, Refills: 0    polyethylene glycol (MIRALAX / GLYCOLAX) packet Take 17 g by mouth daily as needed. Qty: 30 each, Refills: 3      CONTINUE these medications which have NOT CHANGED   Details  ferrous sulfate (FERROUSUL) 325 (65 FE) MG tablet Take 1 tablet (325 mg total) by mouth daily with breakfast. Qty: 30 tablet, Refills: 0    glatiramer (COPAXONE) 20 MG/ML SOSY injection Inject 1 mL (20 mg total) into the skin daily. Qty: 1 each, Refills: 6    guaiFENesin (MUCINEX) 600 MG 12 hr tablet Take 600 mg by mouth 2 (two) times daily as needed for cough (cough).    magnesium hydroxide (MILK OF MAGNESIA) 400 MG/5ML suspension Take 30 mLs by mouth daily as needed for mild constipation (constipation).    simvastatin (ZOCOR) 10 MG tablet Take 10 mg by mouth at bedtime.    Associated Diagnoses: Malignant GIST    imatinib (GLEEVEC) 100 MG tablet Take 1 tablet (100 mg total) by mouth daily. Take with meals and large glass of water.Caution:Chemotherapy Qty: 40 tablet, Refills: 0    prochlorperazine (COMPAZINE) 10 MG tablet Take 1 tablet (10 mg total) by mouth every 6 (six) hours as needed for nausea or vomiting. Qty: 30 tablet, Refills: 1      STOP taking these medications      diclofenac (VOLTAREN) 75 MG EC tablet      lisinopril (PRINIVIL,ZESTRIL) 10 MG tablet        Follow-up Information  Follow up with OSEI-BONSU,GEORGE, MD. Schedule an appointment as soon as possible for a visit in 1 week.   Specialty:  Internal Medicine   Why:  post hospitalization follow up   Contact information:   [address] [place] [address] [phone]       Follow up with Betsy Coder, MD.   Specialty:  Oncology   Why:  as scheduled   Contact information:   [address] [place] [address] [phone]       TOTAL DISCHARGE TIME: 35 minutes.  [facility]  [facility] Pager [phone]  01/12/2014, 2:17 PM

## 2014-01-16 ENCOUNTER — Encounter: Payer: Self-pay | Admitting: *Deleted

## 2014-01-16 NOTE — Progress Notes (Signed)
[place] Work  Clinical Social Work received a phone call from patient requesting transportation assistance.  Patient has previously used the bus as transportation to her appointments, but due to recent health concerns she is unable to walk to and from the bus stop.  CSW and patient discussed transportation resources and patient was agreeable to [facility].  CSW contacted [facility] to register patient.  [facility] will contact patient today or tomorrow to review program details and schedule transportation if volunteer is available.  Patient verbalized understanding and plans to call CSW once she speaks with [facility].  CSW and patient also discussed SCAT and pt plans to review and complete application with CSW after her appointment on Friday.  CSW provided contact information and encouraged patient to call with questions or concerns.    Clinical Social Work interventions: Dietitian to Recovery   Johnnye Lana, MSW, LCSW, OSW-C Clinical Social Worker [employer] [phone]

## 2014-01-19 ENCOUNTER — Telehealth: Payer: Self-pay | Admitting: *Deleted

## 2014-01-19 ENCOUNTER — Telehealth: Payer: Self-pay | Admitting: Neurology

## 2014-01-19 NOTE — Telephone Encounter (Signed)
Pt wants to speak with Dr Tomi Likens about what is going on with her health please call [phone]

## 2014-01-19 NOTE — Telephone Encounter (Signed)
Spoke with patient she wanted to let Dr Tomi Likens know she is not able to keep any appt at this point she has several tumors and it so swollen she is unable to walk. Oncology will be sending note to let Dr Tomi Likens know how she is doing

## 2014-01-20 ENCOUNTER — Other Ambulatory Visit: Payer: Self-pay

## 2014-01-20 ENCOUNTER — Telehealth: Payer: Self-pay

## 2014-01-20 ENCOUNTER — Encounter: Payer: Self-pay | Admitting: *Deleted

## 2014-01-20 ENCOUNTER — Ambulatory Visit (HOSPITAL_BASED_OUTPATIENT_CLINIC_OR_DEPARTMENT_OTHER): Payer: Medicaid Other | Admitting: Nurse Practitioner

## 2014-01-20 ENCOUNTER — Other Ambulatory Visit (HOSPITAL_BASED_OUTPATIENT_CLINIC_OR_DEPARTMENT_OTHER): Payer: Medicaid Other

## 2014-01-20 ENCOUNTER — Telehealth: Payer: Self-pay | Admitting: Nurse Practitioner

## 2014-01-20 VITALS — BP 125/65 | HR 80 | Temp 97.7°F | Resp 18 | Ht 68.0 in | Wt 218.1 lb

## 2014-01-20 DIAGNOSIS — C494 Malignant neoplasm of connective and soft tissue of abdomen: Secondary | ICD-10-CM

## 2014-01-20 DIAGNOSIS — C49A0 Gastrointestinal stromal tumor, unspecified site: Secondary | ICD-10-CM

## 2014-01-20 LAB — CBC WITH DIFFERENTIAL/PLATELET
BASO%: 0.2 % (ref 0.0–2.0)
BASOS ABS: 0 10*3/uL (ref 0.0–0.1)
EOS%: 3.4 % (ref 0.0–7.0)
Eosinophils Absolute: 0.1 10*3/uL (ref 0.0–0.5)
HCT: 33.8 % — ABNORMAL LOW (ref 34.8–46.6)
HEMOGLOBIN: 10.6 g/dL — AB (ref 11.6–15.9)
LYMPH#: 1.2 10*3/uL (ref 0.9–3.3)
LYMPH%: 28.3 % (ref 14.0–49.7)
MCH: 27 pg (ref 25.1–34.0)
MCHC: 31.4 g/dL — AB (ref 31.5–36.0)
MCV: 86 fL (ref 79.5–101.0)
MONO#: 0.3 10*3/uL (ref 0.1–0.9)
MONO%: 6.8 % (ref 0.0–14.0)
NEUT#: 2.5 10*3/uL (ref 1.5–6.5)
NEUT%: 61.3 % (ref 38.4–76.8)
Platelets: 374 10*3/uL (ref 145–400)
RBC: 3.93 10*6/uL (ref 3.70–5.45)
RDW: 15.9 % — ABNORMAL HIGH (ref 11.2–14.5)
WBC: 4.1 10*3/uL (ref 3.9–10.3)

## 2014-01-20 LAB — COMPREHENSIVE METABOLIC PANEL (CC13)
ALK PHOS: 89 U/L (ref 40–150)
ALT: 9 U/L (ref 0–55)
ANION GAP: 8 meq/L (ref 3–11)
AST: 19 U/L (ref 5–34)
Albumin: 2.9 g/dL — ABNORMAL LOW (ref 3.5–5.0)
BUN: 10.2 mg/dL (ref 7.0–26.0)
CO2: 26 meq/L (ref 22–29)
CREATININE: 1.6 mg/dL — AB (ref 0.6–1.1)
Calcium: 8.8 mg/dL (ref 8.4–10.4)
Chloride: 106 mEq/L (ref 98–109)
EGFR: 46 mL/min/{1.73_m2} — ABNORMAL LOW (ref >90)
GLUCOSE: 91 mg/dL (ref 70–140)
POTASSIUM: 4 meq/L (ref 3.5–5.1)
Sodium: 140 mEq/L (ref 136–145)
Total Bilirubin: 0.62 mg/dL (ref 0.20–1.20)
Total Protein: 6.5 g/dL (ref 6.4–8.3)

## 2014-01-20 MED ORDER — IMATINIB MESYLATE 100 MG PO TABS: 300.0000 mg | ORAL_TABLET | Freq: Every day | ORAL | Status: DC

## 2014-01-20 MED ORDER — HYDROCODONE-ACETAMINOPHEN 5-325 MG PO TABS: 1.0000 | ORAL_TABLET | Freq: Four times a day (QID) | ORAL | Status: DC | PRN

## 2014-01-20 NOTE — Telephone Encounter (Signed)
Called pathology to have GIST mutation testing (exon 9 and exon 11) added to patients US Biopsy done on 01/11/14. Maudie Mercury states she will have it sent out.

## 2014-01-20 NOTE — Progress Notes (Addendum)
[facility] OFFICE PROGRESS NOTE   Diagnosis:  Gastrointestinal stromal tumor  INTERVAL HISTORY:   Monica [name] returns as scheduled. She was hospitalized 01/09/2014 with abdominal distention, nausea, vomiting and abdominal pain. She was found to be markedly anemic. CT of the abdomen/pelvis showed numerous soft tissue masses filling the abdomen and pelvis consistent with recurrent tumor. Soft tissue masses caused significant narrowing of the gastric lumen. Small and large bowel loops also appeared narrowed/compressed but did not appear to be obstructed. Findings suspicious for right hilar lymphadenopathy or tumor nodularity. Biopsy of an abdominal mass on 01/11/2014 showed sheets of spindle cells with myxoid differentiation. The morphology was similar to the patient's previous gastrointestinal stromal tumor. Gleevec was resumed. She was discharged home on 01/12/2014.  She reports she is currently taking Gleevec 200 mg daily and is tolerating it well. No significant nausea/vomiting. No diarrhea. Bowels have been moving regularly. She denies rectal bleeding. She has intermittent abdominal pain. No skin rash. She has mild dyspnea on exertion. No fever.  Objective:  Vital signs in last 24 hours:  Blood pressure 125/65, pulse 80, temperature 97.7 F (36.5 C), temperature source Oral, resp. rate 18, height 5' 8"  (1.727 m), weight 218 lb 1.6 oz (98.93 kg), last menstrual period 01/02/2014, SpO2 100 %.    HEENT: No thrush or ulcers. Resp: Lungs clear bilaterally. Cardio: Regular rate and rhythm. GI: Abdomen markedly distended with mild generalized tenderness. Vascular: Trace edema at the lower legs bilaterally.     Lab Results:  Lab Results  Component Value Date   WBC 4.1 01/20/2014   HGB 10.6* 01/20/2014   HCT 33.8* 01/20/2014   MCV 86.0 01/20/2014   PLT 374 01/20/2014   NEUTROABS 2.5 01/20/2014    Imaging:  No results found.  Medications: I have reviewed the  patient's current medications.  Assessment/Plan: 1. Gastrointestinal stromal tumor of the stomach, high grade, stage IIIB (T4 NX), ruptured tumor, status post partial gastrectomy 09/07/2012. She began adjuvant Gleevec 10/19/2012 with a three-year course planned.  She discontinued Gleevec 06/20/2013  CT 01/09/2014 with numerous soft tissue masses filling the abdomen and pelvis consistent with recurrent tumor. Soft tissue masses caused significant narrowing of the gastric lumen. Small and large bowel loops appeared narrowed/compressed but did not appear to be obstructed. Findings suspicious for right hilar lymphadenopathy or tumor nodularity.   Biopsy of an abdominal mass 01/11/2014 showed sheets of spindle cells with myxoid differentiation. The morphology was similar to the patient's previous gastrointestinal stromal tumor.  Gleevec was resumed 01/11/2014  Gleevec dose increased to 300 mg daily 01/20/2014 2. Multiple sclerosis followed by Dr. Tomi Likens 3. Anemia secondary to blood loss from #1. Improved. 4. Renal dysfunction. Improved.   Disposition: Monica [name] was recently found to have massive recurrence of the gastrointestinal stromal tumor involving the abdomen and pelvis. Gleevec has been resumed. She appears to be tolerating the [place] well. She will increase the dose to 300 mg daily. We will follow-up on the mutation testing. She will return for an office visit and labs in approximately 10 days.  She was given a prescription for Norco 5/325 one tablet every 6 hours as needed for abdominal pain.  Patient seen with Dr. Benay Spice. 25 minutes were spent face-to-face at today's visit with the majority of that time involved in counseling/coordination of care.   Ned Card ANP/GNP-BC   01/20/2014  3:40 PM   This was a shared visit with Ned Card. Monica [name] appears to be tolerating the [place] well.  We increased the dose to 300 mg daily. We are waiting on the c-kit mutation testing. She  will return for an office visit in 2 weeks.  Julieanne Manson, M.D.

## 2014-01-20 NOTE — Telephone Encounter (Signed)
S/w pt confirming labs/ov per 01/08 POF..... KJ

## 2014-01-20 NOTE — Telephone Encounter (Signed)
Late entry:  Monica [name], CSW pt had questions regarding SCAT forms to sign. Abby Potash states she was on her way down to see this pt.

## 2014-01-20 NOTE — Telephone Encounter (Signed)
[place] rx printed and signed, placed in Monica [name]'s box. Pt would like to switch pharmacy's to [facility] if Medicaid would allow due to transportation issues.

## 2014-01-20 NOTE — Progress Notes (Signed)
[place] Work  Clinical Social Work was referred by nurse for assessment of psychosocial needs and transportation assistance.  Clinical Social Worker met with patient at [facility] and completed SCAT application with pt. Pt denied other needs currently and was able to use [facility] transportation today. CSW completed application with pt and faxed it to SCAT intake. Pt very appreciative and is aware of how to reach out to CSW as needed.   Clinical Social Work interventions: Resource education and assistance  Loren Racer, [place] Worker [place]  [place] Phone: [phone] Fax: [phone]

## 2014-01-27 ENCOUNTER — Other Ambulatory Visit (HOSPITAL_COMMUNITY)
Admission: RE | Admit: 2014-01-27 | Discharge: 2014-01-27 | Disposition: A | Discharge status: Home / Self Care | Payer: Medicaid Other | Source: Ambulatory Visit | Attending: Oncology | Admitting: Oncology

## 2014-01-27 ENCOUNTER — Ambulatory Visit: Payer: Medicaid Other | Admitting: Neurology

## 2014-01-27 DIAGNOSIS — D481 Neoplasm of uncertain behavior of connective and other soft tissue: Secondary | ICD-10-CM | POA: Diagnosis not present

## 2014-02-02 ENCOUNTER — Ambulatory Visit (HOSPITAL_BASED_OUTPATIENT_CLINIC_OR_DEPARTMENT_OTHER): Payer: Medicaid Other | Admitting: Nurse Practitioner

## 2014-02-02 ENCOUNTER — Telehealth: Payer: Self-pay

## 2014-02-02 ENCOUNTER — Other Ambulatory Visit (HOSPITAL_BASED_OUTPATIENT_CLINIC_OR_DEPARTMENT_OTHER): Payer: Medicaid Other

## 2014-02-02 ENCOUNTER — Telehealth: Payer: Self-pay | Admitting: Nurse Practitioner

## 2014-02-02 VITALS — BP 119/69 | HR 83 | Temp 98.6°F | Resp 18 | Ht 68.0 in | Wt 204.6 lb

## 2014-02-02 DIAGNOSIS — D500 Iron deficiency anemia secondary to blood loss (chronic): Secondary | ICD-10-CM

## 2014-02-02 DIAGNOSIS — C49A0 Gastrointestinal stromal tumor, unspecified site: Secondary | ICD-10-CM

## 2014-02-02 DIAGNOSIS — C499 Malignant neoplasm of connective and soft tissue, unspecified: Secondary | ICD-10-CM

## 2014-02-02 DIAGNOSIS — G35 Multiple sclerosis: Secondary | ICD-10-CM

## 2014-02-02 LAB — CBC WITH DIFFERENTIAL/PLATELET
BASO%: 1.1 % (ref 0.0–2.0)
Basophils Absolute: 0 10*3/uL (ref 0.0–0.1)
EOS ABS: 0.1 10*3/uL (ref 0.0–0.5)
EOS%: 2.3 % (ref 0.0–7.0)
HEMATOCRIT: 34.7 % — AB (ref 34.8–46.6)
HEMOGLOBIN: 10.7 g/dL — AB (ref 11.6–15.9)
LYMPH#: 0.8 10*3/uL — AB (ref 0.9–3.3)
LYMPH%: 29.9 % (ref 14.0–49.7)
MCH: 26.6 pg (ref 25.1–34.0)
MCHC: 30.7 g/dL — AB (ref 31.5–36.0)
MCV: 86.7 fL (ref 79.5–101.0)
MONO#: 0.3 10*3/uL (ref 0.1–0.9)
MONO%: 9.4 % (ref 0.0–14.0)
NEUT%: 57.3 % (ref 38.4–76.8)
NEUTROS ABS: 1.5 10*3/uL (ref 1.5–6.5)
PLATELETS: 310 10*3/uL (ref 145–400)
RBC: 4 10*6/uL (ref 3.70–5.45)
RDW: 18.3 % — AB (ref 11.2–14.5)
WBC: 2.7 10*3/uL — AB (ref 3.9–10.3)

## 2014-02-02 LAB — COMPREHENSIVE METABOLIC PANEL (CC13)
ALT: <6 U/L (ref 0–55)
AST: 11 U/L (ref 5–34)
Albumin: 3.2 g/dL — ABNORMAL LOW (ref 3.5–5.0)
Alkaline Phosphatase: 83 U/L (ref 40–150)
Anion Gap: 8 mEq/L (ref 3–11)
BUN: 8.7 mg/dL (ref 7.0–26.0)
CO2: 24 mEq/L (ref 22–29)
Calcium: 8.5 mg/dL (ref 8.4–10.4)
Chloride: 108 mEq/L (ref 98–109)
Creatinine: 1.6 mg/dL — ABNORMAL HIGH (ref 0.6–1.1)
EGFR: 45 mL/min/{1.73_m2} — AB (ref >90)
GLUCOSE: 106 mg/dL (ref 70–140)
POTASSIUM: 3.5 meq/L (ref 3.5–5.1)
Sodium: 141 mEq/L (ref 136–145)
Total Bilirubin: 0.71 mg/dL (ref 0.20–1.20)
Total Protein: 6.4 g/dL (ref 6.4–8.3)

## 2014-02-02 NOTE — Telephone Encounter (Signed)
Pt confirmed labs/ov per 01/21 POF, gave pt AVS... KJ °

## 2014-02-02 NOTE — Telephone Encounter (Signed)
Called for GIST mutation results. Was informed by Maudie Mercury in pathology results were not back yet. Informed Marlynn Perking.

## 2014-02-02 NOTE — Progress Notes (Signed)
  [place] OFFICE PROGRESS NOTE   Diagnosis:  Gastrointestinal stromal tumor  INTERVAL HISTORY:   Ms. [name] returns as scheduled. The Gleevec dose was increased to 300 mg daily beginning 01/20/2014. She denies nausea/vomiting. She notes loose stools about every other day. At the most she has 2 loose stools a day. Abdomen is less distended. She denies any significant abdominal pain. No skin rash. No shortness of breath. No bleeding.  Objective:  Vital signs in last 24 hours:  Blood pressure 119/69, pulse 83, temperature 98.6 F (37 C), temperature source Oral, resp. rate 18, height $RemoveBe'5\' 8"'nEXydEXvR$  (1.727 m), weight 204 lb 9.6 oz (92.806 kg), last menstrual period 01/02/2014, SpO2 99 %.    HEENT: No thrush or ulcers. Resp: Breath sounds diminished at the bases. Lungs are clear. Cardio: Regular rate and rhythm. GI: Abdomen with mild distention, mild generalized tenderness. Vascular: No leg edema. Skin: Faint, fine rash at the upper back.    Lab Results:  Lab Results  Component Value Date   WBC 2.7* 02/02/2014   HGB 10.7* 02/02/2014   HCT 34.7* 02/02/2014   MCV 86.7 02/02/2014   PLT 310 02/02/2014   NEUTROABS 1.5 02/02/2014    Imaging:  No results found.  Medications: I have reviewed the patient's current medications.  Assessment/Plan: 1. Gastrointestinal stromal tumor of the stomach, high grade, stage IIIB (T4 NX), ruptured tumor, status post partial gastrectomy 09/07/2012. She began adjuvant Gleevec 10/19/2012 with a three-year course planned.  She discontinued Gleevec 06/20/2013  CT 01/09/2014 with numerous soft tissue masses filling the abdomen and pelvis consistent with recurrent tumor. Soft tissue masses caused significant narrowing of the gastric lumen. Small and large bowel loops appeared narrowed/compressed but did not appear to be obstructed. Findings suspicious for right hilar lymphadenopathy or tumor nodularity.   Biopsy of an abdominal mass  01/11/2014 showed sheets of spindle cells with myxoid differentiation. The morphology was similar to the patient's previous gastrointestinal stromal tumor.  Gleevec was resumed 01/11/2014  Gleevec dose increased to 300 mg daily 01/20/2014 2. Multiple sclerosis followed by Dr. Tomi Likens 3. Anemia secondary to blood loss from #1. Improved. 4. Renal dysfunction. Improved.   Disposition: Monica [name] appears improved. Abdominal distention and pain are significantly better. She will continue Gleevec 300 mg daily. She will return for a follow-up visit in approximately 3 weeks. She understands to contact the office in the interim with any problems.  We will contact pathology regarding the c-kit mutation testing.  Plan reviewed with Dr. Benay Spice.  Ned Card ANP/GNP-BC   02/02/2014  12:47 PM

## 2014-02-03 ENCOUNTER — Encounter (HOSPITAL_COMMUNITY): Payer: Self-pay

## 2014-02-10 ENCOUNTER — Other Ambulatory Visit: Payer: Self-pay | Admitting: Neurology

## 2014-02-14 ENCOUNTER — Other Ambulatory Visit: Payer: Self-pay

## 2014-02-14 DIAGNOSIS — C49A0 Gastrointestinal stromal tumor, unspecified site: Secondary | ICD-10-CM

## 2014-02-14 MED ORDER — IMATINIB MESYLATE 100 MG PO TABS: 300.0000 mg | ORAL_TABLET | Freq: Every day | ORAL | Status: DC

## 2014-02-24 ENCOUNTER — Ambulatory Visit (HOSPITAL_BASED_OUTPATIENT_CLINIC_OR_DEPARTMENT_OTHER): Payer: Medicaid Other | Admitting: Oncology

## 2014-02-24 ENCOUNTER — Other Ambulatory Visit (HOSPITAL_BASED_OUTPATIENT_CLINIC_OR_DEPARTMENT_OTHER): Payer: Medicaid Other

## 2014-02-24 ENCOUNTER — Telehealth: Payer: Self-pay | Admitting: *Deleted

## 2014-02-24 ENCOUNTER — Telehealth: Payer: Self-pay | Admitting: Oncology

## 2014-02-24 VITALS — BP 131/78 | HR 67 | Temp 98.1°F | Resp 20 | Ht 68.0 in | Wt 202.1 lb

## 2014-02-24 DIAGNOSIS — C49A0 Gastrointestinal stromal tumor, unspecified site: Secondary | ICD-10-CM

## 2014-02-24 DIAGNOSIS — C494 Malignant neoplasm of connective and soft tissue of abdomen: Secondary | ICD-10-CM

## 2014-02-24 DIAGNOSIS — E876 Hypokalemia: Secondary | ICD-10-CM

## 2014-02-24 DIAGNOSIS — D500 Iron deficiency anemia secondary to blood loss (chronic): Secondary | ICD-10-CM

## 2014-02-24 LAB — COMPREHENSIVE METABOLIC PANEL (CC13)
ALBUMIN: 3.3 g/dL — AB (ref 3.5–5.0)
ALT: <6 U/L (ref 0–55)
AST: 12 U/L (ref 5–34)
Alkaline Phosphatase: 78 U/L (ref 40–150)
Anion Gap: 8 mEq/L (ref 3–11)
BUN: 9 mg/dL (ref 7.0–26.0)
CO2: 24 meq/L (ref 22–29)
Calcium: 8.3 mg/dL — ABNORMAL LOW (ref 8.4–10.4)
Chloride: 110 mEq/L — ABNORMAL HIGH (ref 98–109)
Creatinine: 1.4 mg/dL — ABNORMAL HIGH (ref 0.6–1.1)
EGFR: 53 mL/min/{1.73_m2} — AB (ref >90)
GLUCOSE: 90 mg/dL (ref 70–140)
Potassium: 3.2 mEq/L — ABNORMAL LOW (ref 3.5–5.1)
Sodium: 142 mEq/L (ref 136–145)
TOTAL PROTEIN: 6 g/dL — AB (ref 6.4–8.3)
Total Bilirubin: 0.69 mg/dL (ref 0.20–1.20)

## 2014-02-24 LAB — CBC WITH DIFFERENTIAL/PLATELET
BASO%: 0.8 % (ref 0.0–2.0)
Basophils Absolute: 0 10*3/uL (ref 0.0–0.1)
EOS%: 3.7 % (ref 0.0–7.0)
Eosinophils Absolute: 0.1 10*3/uL (ref 0.0–0.5)
HCT: 30.3 % — ABNORMAL LOW (ref 34.8–46.6)
HGB: 9.5 g/dL — ABNORMAL LOW (ref 11.6–15.9)
LYMPH#: 0.8 10*3/uL — AB (ref 0.9–3.3)
LYMPH%: 30.3 % (ref 14.0–49.7)
MCH: 27.3 pg (ref 25.1–34.0)
MCHC: 31.3 g/dL — AB (ref 31.5–36.0)
MCV: 87.2 fL (ref 79.5–101.0)
MONO#: 0.2 10*3/uL (ref 0.1–0.9)
MONO%: 8.2 % (ref 0.0–14.0)
NEUT#: 1.5 10*3/uL (ref 1.5–6.5)
NEUT%: 57 % (ref 38.4–76.8)
Platelets: 243 10*3/uL (ref 145–400)
RBC: 3.48 10*6/uL — AB (ref 3.70–5.45)
RDW: 22.5 % — AB (ref 11.2–14.5)
WBC: 2.7 10*3/uL — ABNORMAL LOW (ref 3.9–10.3)

## 2014-02-24 MED ORDER — PROCHLORPERAZINE MALEATE 10 MG PO TABS
10.0000 mg | ORAL_TABLET | Freq: Four times a day (QID) | ORAL | Status: DC | PRN
Start: 2014-02-24 — End: 2014-05-15

## 2014-02-24 MED ORDER — POTASSIUM CHLORIDE CRYS ER 20 MEQ PO TBCR: 20.0000 meq | EXTENDED_RELEASE_TABLET | Freq: Every day | ORAL | Status: DC

## 2014-02-24 NOTE — Telephone Encounter (Signed)
Lft msg for pt confirming labs/ov per 02/12 POF, mailed sch to pt.Marland Kitchen KJ

## 2014-02-24 NOTE — Telephone Encounter (Signed)
-----   Message from Ladell Pier, MD sent at 02/24/2014  1:40 PM EST ----- Please call patient, start kcl 67meq daily, repeat cmet should be ordered for next month

## 2014-02-24 NOTE — Progress Notes (Signed)
  [place] OFFICE PROGRESS NOTE   Diagnosis: Gastrointestinal stromal tumor  INTERVAL HISTORY:   Ms. Aldaz returns as scheduled. She continues Gleevec. The leg swelling and abdominal distention has resolved. She reports a few episodes of vomiting. She has discomfort at the upper lateral abdomen bilaterally when she lies on her side. No diarrhea.  Objective:  Vital signs in last 24 hours:  Blood pressure 131/78, pulse 67, temperature 98.1 F (36.7 C), temperature source Oral, resp. rate 20, height 5\' 8"  (1.727 m), weight 202 lb 1.6 oz (91.672 kg).    HEENT: No thrush or ulcers Resp: Decreased breath sounds at the right lower chest, no respiratory distress Cardio: Regular rate and rhythm GI: No hepatomegaly, no apparent ascites, no mass. Vascular: No leg edema Musculoskeletal: There is swelling at the lateral upper abdominal wall bilaterally with mild pitting edema  Skin: Mild fine rash over the trunk    Lab Results:  Lab Results  Component Value Date   WBC 2.7* 02/24/2014   HGB 9.5* 02/24/2014   HCT 30.3* 02/24/2014   MCV 87.2 02/24/2014   PLT 243 02/24/2014   NEUTROABS 1.5 02/24/2014   potassium 3.2, creatinine 1.4   Medications: I have reviewed the patient's current medications.  Assessment/Plan: 1. Gastrointestinal stromal tumor of the stomach, high grade, stage IIIB (T4 NX), ruptured tumor, status post partial gastrectomy 09/07/2012. She began adjuvant Gleevec 10/19/2012 with a three-year course planned.  She discontinued Gleevec 06/20/2013  CT 01/09/2014 with numerous soft tissue masses filling the abdomen and pelvis consistent with recurrent tumor. Soft tissue masses caused significant narrowing of the gastric lumen. Small and large bowel loops appeared narrowed/compressed but did not appear to be obstructed. Findings suspicious for right hilar lymphadenopathy or tumor nodularity.   Biopsy of an abdominal mass 01/11/2014 showed sheets of  spindle cells with myxoid differentiation. The morphology was similar to the patient's previous gastrointestinal stromal tumor.  Gleevec was resumed 01/11/2014  Gleevec dose increased to 300 mg daily 01/20/2014 2. Multiple sclerosis followed by Dr. Tomi Likens 3. Anemia secondary to blood loss from #1. Improved. 4. Renal dysfunction. Improved. 5. Hypokalemia-she will begin a potassium supplement   Disposition:  She appears to be tolerating the [place] well. The abdominal distention is markedly improved. I suspect the discomfort at the upper lateral abdominal wall is related to resolving edema. She will continue Gleevec. Ms. Wunschel will return for an office visit in one month.  Betsy Coder, MD  02/24/2014  11:45 AM

## 2014-03-13 ENCOUNTER — Telehealth: Payer: Self-pay | Admitting: *Deleted

## 2014-03-13 NOTE — Telephone Encounter (Signed)
Message that she is having problems swallowing potassium 20 meq and would like a capsule.  This nurse called and she is not having trouble swallowing food.  reviewed potassium rich foods encouraging to eat more broccoli, bananas, yogurt, etc. But does not have a good appetite a this time.  Uses [facility] [phone].  Has medicaid and if it is not covered says she will keep trying the pill.  Will ask provider if she may change to K+ elixir.  Call back number [phone].

## 2014-03-14 NOTE — Telephone Encounter (Signed)
Contacted pt re: potassium; asked pt if she wanted office to order potassium elixir at this time or explained to pt that potassium pill can be dissolved in water/juice and she can take it that way.  Pt states she did not know it could be dissolved; "I will try that instead"  Pt denies any other c/o at this time; verbalized understanding of instruction re: potassium and confirmed appt for 03/24/14.

## 2014-03-16 ENCOUNTER — Other Ambulatory Visit: Payer: Self-pay | Admitting: *Deleted

## 2014-03-16 DIAGNOSIS — C49A0 Gastrointestinal stromal tumor, unspecified site: Secondary | ICD-10-CM

## 2014-03-16 MED ORDER — IMATINIB MESYLATE 100 MG PO TABS: 300.0000 mg | ORAL_TABLET | Freq: Every day | ORAL | Status: DC

## 2014-03-22 ENCOUNTER — Telehealth: Payer: Self-pay | Admitting: *Deleted

## 2014-03-22 MED ORDER — TRAMADOL HCL 50 MG PO TABS: 50.0000 mg | ORAL_TABLET | Freq: Four times a day (QID) | ORAL | Status: DC | PRN

## 2014-03-22 NOTE — Telephone Encounter (Signed)
Returned call to pt, she reports bowels are moving. No longer has Oxycodone or Hydrocodone at home. Reviewed with Dr. Benay Spice: Order received for Tramadol. Rx called to [facility]. Pt will keep her 3/11 office visit. She understands to call office for N/V or trouble moving her bowels.

## 2014-03-22 NOTE — Telephone Encounter (Signed)
Pt states she has been having pain on her right side and stomach for "several weeks". States it is a different pain in different area than when she saw Dr Benay Spice last month. Has not taken anything for pain during this time. Vomited last night ,denies N/V this am. Bowels moving without problems. "Dr Benay Spice offered me something for pain last month, but I did not take it". Has appt with Ned Card, NP on 03/24/14, but needs something now. Please advise

## 2014-03-24 ENCOUNTER — Other Ambulatory Visit (HOSPITAL_BASED_OUTPATIENT_CLINIC_OR_DEPARTMENT_OTHER): Payer: Medicaid Other

## 2014-03-24 ENCOUNTER — Ambulatory Visit (HOSPITAL_BASED_OUTPATIENT_CLINIC_OR_DEPARTMENT_OTHER): Payer: Medicaid Other | Admitting: Nurse Practitioner

## 2014-03-24 ENCOUNTER — Telehealth: Payer: Self-pay | Admitting: Oncology

## 2014-03-24 VITALS — BP 114/77 | HR 90 | Temp 99.1°F | Resp 18 | Ht 68.0 in | Wt 196.3 lb

## 2014-03-24 DIAGNOSIS — C49A0 Gastrointestinal stromal tumor, unspecified site: Secondary | ICD-10-CM

## 2014-03-24 DIAGNOSIS — C494 Malignant neoplasm of connective and soft tissue of abdomen: Secondary | ICD-10-CM

## 2014-03-24 DIAGNOSIS — R634 Abnormal weight loss: Secondary | ICD-10-CM

## 2014-03-24 DIAGNOSIS — R630 Anorexia: Secondary | ICD-10-CM

## 2014-03-24 DIAGNOSIS — R109 Unspecified abdominal pain: Secondary | ICD-10-CM

## 2014-03-24 LAB — COMPREHENSIVE METABOLIC PANEL (CC13)
ALBUMIN: 3.3 g/dL — AB (ref 3.5–5.0)
ALT: 7 U/L (ref 0–55)
ANION GAP: 10 meq/L (ref 3–11)
AST: 10 U/L (ref 5–34)
Alkaline Phosphatase: 75 U/L (ref 40–150)
BILIRUBIN TOTAL: 0.64 mg/dL (ref 0.20–1.20)
BUN: 11.9 mg/dL (ref 7.0–26.0)
CO2: 21 meq/L — AB (ref 22–29)
Calcium: 8.4 mg/dL (ref 8.4–10.4)
Chloride: 107 mEq/L (ref 98–109)
Creatinine: 1.3 mg/dL — ABNORMAL HIGH (ref 0.6–1.1)
EGFR: 57 mL/min/{1.73_m2} — ABNORMAL LOW (ref >90)
Glucose: 118 mg/dl (ref 70–140)
POTASSIUM: 3.5 meq/L (ref 3.5–5.1)
SODIUM: 137 meq/L (ref 136–145)
TOTAL PROTEIN: 6.4 g/dL (ref 6.4–8.3)

## 2014-03-24 LAB — CBC WITH DIFFERENTIAL/PLATELET
BASO%: 0.4 % (ref 0.0–2.0)
BASOS ABS: 0 10*3/uL (ref 0.0–0.1)
EOS%: 2.9 % (ref 0.0–7.0)
Eosinophils Absolute: 0.1 10*3/uL (ref 0.0–0.5)
HCT: 26.4 % — ABNORMAL LOW (ref 34.8–46.6)
HGB: 8.3 g/dL — ABNORMAL LOW (ref 11.6–15.9)
LYMPH%: 20.8 % (ref 14.0–49.7)
MCH: 28.4 pg (ref 25.1–34.0)
MCHC: 31.5 g/dL (ref 31.5–36.0)
MCV: 90.3 fL (ref 79.5–101.0)
MONO#: 0.3 10*3/uL (ref 0.1–0.9)
MONO%: 6.3 % (ref 0.0–14.0)
NEUT%: 69.6 % (ref 38.4–76.8)
NEUTROS ABS: 3.2 10*3/uL (ref 1.5–6.5)
Platelets: 324 10*3/uL (ref 145–400)
RBC: 2.93 10*6/uL — ABNORMAL LOW (ref 3.70–5.45)
RDW: 24.5 % — ABNORMAL HIGH (ref 11.2–14.5)
WBC: 4.5 10*3/uL (ref 3.9–10.3)
lymph#: 0.9 10*3/uL (ref 0.9–3.3)

## 2014-03-24 NOTE — Progress Notes (Addendum)
  [place] OFFICE PROGRESS NOTE   Diagnosis:  Gastrointestinal stromal tumor  INTERVAL HISTORY:   [name] returns as scheduled. She continues Gleevec 300 mg daily. Abdominal distention continues to be improved. 2 weeks ago she began experiencing right-sided abdominal pain. The pain is occurring intermittently. The pain is relieved with tramadol which she is taking every 6 hours. Bowels moving regularly. No diarrhea or constipation. No bloody or black stools. She occasionally vomits. She reports a stable rash over her back. She reports a poor appetite.  Objective:  Vital signs in last 24 hours:  Blood pressure 114/77, pulse 90, temperature 99.1 F (37.3 C), temperature source Oral, resp. rate 18, height 5\' 8"  (1.727 m), weight 196 lb 4.8 oz (89.041 kg).    HEENT: No thrush or ulcers. Resp: Lungs clear bilaterally. Breath sounds diminished at the bases. Cardio: Regular rate and rhythm. GI: Abdomen is soft. No hepatomegaly. Fullness at the low mid to left abdomen. Vascular: No leg edema. Neuro: Alert and oriented.  Skin: Faint fine rash over the back.    Lab Results:  Lab Results  Component Value Date   WBC 4.5 03/24/2014   HGB 8.3* 03/24/2014   HCT 26.4* 03/24/2014   MCV 90.3 03/24/2014   PLT 324 03/24/2014   NEUTROABS 3.2 03/24/2014    Imaging:  No results found.  Medications: I have reviewed the patient's current medications.  Assessment/Plan: 1. Gastrointestinal stromal tumor of the stomach, high grade, stage IIIB (T4 NX), ruptured tumor, status post partial gastrectomy 09/07/2012. She began adjuvant Gleevec 10/19/2012 with a three-year course planned.  She discontinued Gleevec 06/20/2013  CT 01/09/2014 with numerous soft tissue masses filling the abdomen and pelvis consistent with recurrent tumor. Soft tissue masses caused significant narrowing of the gastric lumen. Small and large bowel loops appeared narrowed/compressed but did not appear to be  obstructed. Findings suspicious for right hilar lymphadenopathy or tumor nodularity.   Biopsy of an abdominal mass 01/11/2014 showed sheets of spindle cells with myxoid differentiation. The morphology was similar to the patient's previous gastrointestinal stromal tumor.  Gleevec was resumed 01/11/2014  Gleevec dose increased to 300 mg daily 01/20/2014 2. Multiple sclerosis followed by Dr. Tomi Likens 3. History of Anemia secondary to blood loss from #1.  4. Renal dysfunction. Improved. 5. History of Hypokalemia-now taking a potassium supplement   Disposition: Monica [name] appears unchanged. The abdominal distention continues to be improved. She will continue Gleevec 300 mg daily.  She has been experiencing right low abdominal pain intermittently for the past 2 weeks and is now taking tramadol. We are referring her for a restaging CT scan of the abdomen/pelvis.  Her hemoglobin is lower. She denies any bleeding. We will repeat a CBC when she returns in 2 weeks.  She reports a poor appetite and is losing weight. We will monitor closely.  She will return for a follow-up visit on 04/07/2014. She will contact the office in the interim with increased pain, bleeding or other concerning symptoms.  Patient seen with Dr. Benay Spice.    Monica [name] ANP/GNP-BC   03/24/2014  11:41 AM  This was a shared visit with Monica [name]. Monica [name] was interviewed and examined. We will refer her for a restaging CT to evaluate the abdominal pain and fullness. She will contact us for symptoms of anemia.  Julieanne Manson, M.D.

## 2014-03-24 NOTE — Telephone Encounter (Signed)
gv and printed appt sched and avs for pt for march...gv pt barium

## 2014-03-31 ENCOUNTER — Encounter (HOSPITAL_COMMUNITY): Payer: Self-pay

## 2014-03-31 ENCOUNTER — Ambulatory Visit (HOSPITAL_COMMUNITY)
Admission: RE | Admit: 2014-03-31 | Discharge: 2014-03-31 | Disposition: A | Discharge status: Home / Self Care | Payer: Medicaid Other | Source: Ambulatory Visit | Attending: Nurse Practitioner | Admitting: Nurse Practitioner

## 2014-03-31 DIAGNOSIS — C49A0 Gastrointestinal stromal tumor, unspecified site: Secondary | ICD-10-CM

## 2014-03-31 DIAGNOSIS — C499 Malignant neoplasm of connective and soft tissue, unspecified: Secondary | ICD-10-CM | POA: Insufficient documentation

## 2014-03-31 DIAGNOSIS — Z08 Encounter for follow-up examination after completed treatment for malignant neoplasm: Secondary | ICD-10-CM | POA: Insufficient documentation

## 2014-03-31 DIAGNOSIS — R1032 Left lower quadrant pain: Secondary | ICD-10-CM | POA: Diagnosis not present

## 2014-03-31 DIAGNOSIS — K8020 Calculus of gallbladder without cholecystitis without obstruction: Secondary | ICD-10-CM | POA: Insufficient documentation

## 2014-04-04 ENCOUNTER — Other Ambulatory Visit: Payer: Self-pay | Admitting: Neurology

## 2014-04-07 ENCOUNTER — Other Ambulatory Visit: Payer: Medicaid Other

## 2014-04-07 ENCOUNTER — Ambulatory Visit: Payer: Medicaid Other | Admitting: Nurse Practitioner

## 2014-04-07 ENCOUNTER — Telehealth: Payer: Self-pay | Admitting: *Deleted

## 2014-04-07 MED ORDER — TRAMADOL HCL 50 MG PO TABS: 50.0000 mg | ORAL_TABLET | Freq: Four times a day (QID) | ORAL | Status: DC | PRN

## 2014-04-07 NOTE — Telephone Encounter (Signed)
Patient called to say she is having so much pain she cannot keep her appointment with Ned Card, and that she needs something stronger for pain.  She said the Tramadol is not working, although she has not taken it since 3:00 am.  Attempted to do a pain assessment and encourage her to come in if she is late, but she would not discuss anything.  She said she wanted the message sent to Dr. Gearldine Shown nurse that she is not coming in and that she needs something stronger for pain.

## 2014-04-07 NOTE — Telephone Encounter (Signed)
Returned call to pt, informed her any stronger pain Rx will have to be picked up in person. Pt reports being awakened with sharp abdominal pain at 0300. Tramadol did not help. Has lower abdominal/ pelvic pain with ambulation. Just took another dose of Tramadol. Bowels are moving. She reports one episode of emesis yesterday. Pt states she can not come in to the office today. Requests Tramadol refill.  Refill called to pharmacy, per Dr. Benay Spice.

## 2014-04-12 ENCOUNTER — Encounter: Payer: Self-pay | Admitting: Skilled Nursing Facility1

## 2014-04-12 NOTE — Progress Notes (Signed)
Subjective:     Patient ID: Monica [name], female   DOB: [birth year], 49 y.o.   MRN: [id number]  HPI   Review of Systems     Objective:   Physical Exam To identify the pts need for dietetic services in relation to wt loss.     Assessment:     Pt identified as being malnourished.    Plan:     Dietitian contacted the pt via telephone in order to assess the pts needs for weight regulation. Pts telephone number [phone]) was for a [facility] with their operating business hours not during the time of the call.

## 2014-04-18 ENCOUNTER — Other Ambulatory Visit: Payer: Medicaid Other

## 2014-04-18 ENCOUNTER — Ambulatory Visit: Payer: Medicaid Other | Admitting: Oncology

## 2014-04-18 ENCOUNTER — Telehealth: Payer: Self-pay | Admitting: Oncology

## 2014-04-18 NOTE — Telephone Encounter (Signed)
Pt called lft msg she had a stomach virus, r/s pt, mailed out schedule.... KJ

## 2014-04-21 ENCOUNTER — Telehealth: Payer: Self-pay | Admitting: *Deleted

## 2014-04-21 NOTE — Telephone Encounter (Signed)
PER 03/24/14 DICTATION INFORMED PT. SHE HAS GASTROINTESTINAL STROMA TUMOR.

## 2014-04-26 ENCOUNTER — Other Ambulatory Visit: Payer: Self-pay | Admitting: *Deleted

## 2014-04-26 ENCOUNTER — Telehealth: Payer: Self-pay | Admitting: *Deleted

## 2014-04-26 DIAGNOSIS — C49A0 Gastrointestinal stromal tumor, unspecified site: Secondary | ICD-10-CM

## 2014-04-26 MED ORDER — IMATINIB MESYLATE 100 MG PO TABS: 300.0000 mg | ORAL_TABLET | Freq: Every day | ORAL | Status: DC

## 2014-04-26 NOTE — Telephone Encounter (Signed)
Opened in error

## 2014-05-15 ENCOUNTER — Other Ambulatory Visit (HOSPITAL_BASED_OUTPATIENT_CLINIC_OR_DEPARTMENT_OTHER): Payer: Medicaid Other

## 2014-05-15 ENCOUNTER — Ambulatory Visit (HOSPITAL_BASED_OUTPATIENT_CLINIC_OR_DEPARTMENT_OTHER): Payer: Medicaid Other | Admitting: Oncology

## 2014-05-15 ENCOUNTER — Telehealth: Payer: Self-pay | Admitting: *Deleted

## 2014-05-15 VITALS — BP 114/65 | HR 97 | Temp 98.4°F | Resp 20 | Ht 68.0 in | Wt 184.8 lb

## 2014-05-15 DIAGNOSIS — C494 Malignant neoplasm of connective and soft tissue of abdomen: Secondary | ICD-10-CM

## 2014-05-15 DIAGNOSIS — C49A0 Gastrointestinal stromal tumor, unspecified site: Secondary | ICD-10-CM

## 2014-05-15 DIAGNOSIS — D500 Iron deficiency anemia secondary to blood loss (chronic): Secondary | ICD-10-CM

## 2014-05-15 LAB — CBC WITH DIFFERENTIAL/PLATELET
BASO%: 0.8 % (ref 0.0–2.0)
Basophils Absolute: 0 10*3/uL (ref 0.0–0.1)
EOS ABS: 0.1 10*3/uL (ref 0.0–0.5)
EOS%: 3.8 % (ref 0.0–7.0)
HCT: 24.5 % — ABNORMAL LOW (ref 34.8–46.6)
HEMOGLOBIN: 7.9 g/dL — AB (ref 11.6–15.9)
LYMPH#: 0.7 10*3/uL — AB (ref 0.9–3.3)
LYMPH%: 21.1 % (ref 14.0–49.7)
MCH: 31.1 pg (ref 25.1–34.0)
MCHC: 32.2 g/dL (ref 31.5–36.0)
MCV: 96.4 fL (ref 79.5–101.0)
MONO#: 0.3 10*3/uL (ref 0.1–0.9)
MONO%: 8.1 % (ref 0.0–14.0)
NEUT#: 2.1 10*3/uL (ref 1.5–6.5)
NEUT%: 66.2 % (ref 38.4–76.8)
Platelets: 303 10*3/uL (ref 145–400)
RBC: 2.54 10*6/uL — AB (ref 3.70–5.45)
RDW: 14.8 % — ABNORMAL HIGH (ref 11.2–14.5)
WBC: 3.2 10*3/uL — AB (ref 3.9–10.3)

## 2014-05-15 MED ORDER — PROCHLORPERAZINE MALEATE 10 MG PO TABS: 10.0000 mg | ORAL_TABLET | Freq: Four times a day (QID) | ORAL | Status: AC | PRN

## 2014-05-15 MED ORDER — HYDROCODONE-ACETAMINOPHEN 5-325 MG PO TABS: 1.0000 | ORAL_TABLET | ORAL | Status: DC | PRN

## 2014-05-15 NOTE — Progress Notes (Signed)
  [place] OFFICE PROGRESS NOTE   Diagnosis: Gastrointestinal stromal tumor  INTERVAL HISTORY:   Ms. Bartling returns as scheduled. She continues Gleevec at a dose of 300 mg daily. No diarrhea. She complains of insomnia. Her chief complaint is pain in the right abdomen that is constant. The pain is not relieved with tramadol. She is losing weight. She reports a fear of becoming "distended "when she eats. No bleeding.  Objective:  Vital signs in last 24 hours:  Blood pressure 114/65, pulse 97, temperature 98.4 F (36.9 C), temperature source Oral, resp. rate 20, height 5\' 8"  (1.727 m), weight 184 lb 12.8 oz (83.825 kg), SpO2 99 %.    HEENT: No thrush or ulcers Resp: Decreased breath sounds at the right lower chest, no respiratory distress Cardio: Regular rate and rhythm GI: Firm fullness in the right abdomen with associated tenderness. No apparent ascites. The left abdomen also has an area of firm fullness, smaller than on the right side. Vascular: No leg edema  Skin: No rash   Portacath/PICC-without erythema  Lab Results:  Lab Results  Component Value Date   WBC 3.2* 05/15/2014   HGB 7.9* 05/15/2014   HCT 24.5* 05/15/2014   MCV 96.4 05/15/2014   PLT 303 05/15/2014   NEUTROABS 2.1 05/15/2014     Imaging:  CT images from 03/31/2014 reviewed with Ms. Moultrie  Medications: I have reviewed the patient's current medications.  Assessment/Plan: 1. Gastrointestinal stromal tumor of the stomach, high grade, stage IIIB (T4 NX), ruptured tumor, status post partial gastrectomy 09/07/2012. She began adjuvant Gleevec 10/19/2012 with a three-year course planned.  She discontinued Gleevec 06/20/2013  CT 01/09/2014 with numerous soft tissue masses filling the abdomen and pelvis consistent with recurrent tumor. Soft tissue masses caused significant narrowing of the gastric lumen. Small and large bowel loops appeared narrowed/compressed but did not appear to be obstructed.  Findings suspicious for right hilar lymphadenopathy or tumor nodularity.   Biopsy of an abdominal mass 01/11/2014 showed sheets of spindle cells with myxoid differentiation. The morphology was similar to the patient's previous gastrointestinal stromal tumor.  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 2. Multiple sclerosis followed by Dr. Tomi Likens 3. History of Anemia secondary to blood loss from #1. Persistent severe anemia. 4. Renal dysfunction. Improved. 5. History of Hypokalemia-now taking a potassium supplement 6. Pain secondary to the large right abdominal mass with displacement of the right kidney 7. 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 The CT on 03/31/2014 revealed a slight decrease in the dominant cystic abdominal masses and a decrease in the anterior peritoneal nodularity. She continues to have tumor-related pain. She will increase the Gleevec to 400 mg daily beginning with the refill next week. She will try hydrocodone for pain.  Ms. Willingham will return stool Hemoccult cards. I suspect the anemia secondary to Gleevec, renal insufficiency, chronic disease, and potentially GI bleeding. She will contact us for symptoms of anemia.  She will be scheduled for an office visit in 2 weeks.  Betsy Coder, MD  05/15/2014  12:49 PM

## 2014-05-15 NOTE — Telephone Encounter (Signed)
PT.'S APPOINTMENT IS TODAY AT 12:00NOON FOR LAB AND 12:30PM TO SEE DR.SHERRILL.

## 2014-05-16 ENCOUNTER — Telehealth: Payer: Self-pay | Admitting: Oncology

## 2014-05-16 NOTE — Telephone Encounter (Signed)
S/w pt confirming labs/ov per 05/02 POF... Monica [name]

## 2014-05-19 ENCOUNTER — Other Ambulatory Visit: Payer: Self-pay | Admitting: *Deleted

## 2014-05-19 DIAGNOSIS — C49A0 Gastrointestinal stromal tumor, unspecified site: Secondary | ICD-10-CM

## 2014-05-19 MED ORDER — IMATINIB MESYLATE 100 MG PO TABS: 400.0000 mg | ORAL_TABLET | Freq: Every day | ORAL | Status: DC

## 2014-05-19 NOTE — Telephone Encounter (Signed)
Spoke with pt, she will continue to use [employer] for [employer]. She understands to increase dose to 400 mg daily.

## 2014-05-23 ENCOUNTER — Telehealth: Payer: Self-pay | Admitting: *Deleted

## 2014-05-23 DIAGNOSIS — C49A0 Gastrointestinal stromal tumor, unspecified site: Secondary | ICD-10-CM

## 2014-05-23 NOTE — Telephone Encounter (Signed)
TC from Lorne Skeens @ [place] [facility] regarding this patient and recent labs from 05/20/14. HGB was 7.7 and HCT was 24. She wanted to make sure Dr. Benay Spice was aware of these recent labs.

## 2014-05-23 NOTE — Addendum Note (Signed)
Addended by: Domenic Schwab on: 05/23/2014 02:32 PM   Modules accepted: Orders

## 2014-05-23 NOTE — Telephone Encounter (Signed)
Was 7.9 here, add type and hold next visit here

## 2014-05-30 ENCOUNTER — Other Ambulatory Visit: Payer: Self-pay | Admitting: *Deleted

## 2014-05-30 ENCOUNTER — Ambulatory Visit (HOSPITAL_COMMUNITY)
Admission: RE | Admit: 2014-05-30 | Discharge: 2014-05-30 | Disposition: A | Discharge status: Home / Self Care | Payer: Medicaid Other | Source: Ambulatory Visit | Attending: Oncology | Admitting: Oncology

## 2014-05-30 ENCOUNTER — Ambulatory Visit (HOSPITAL_BASED_OUTPATIENT_CLINIC_OR_DEPARTMENT_OTHER): Payer: Medicaid Other | Admitting: Nurse Practitioner

## 2014-05-30 ENCOUNTER — Other Ambulatory Visit (HOSPITAL_BASED_OUTPATIENT_CLINIC_OR_DEPARTMENT_OTHER): Payer: Medicaid Other

## 2014-05-30 ENCOUNTER — Telehealth: Payer: Self-pay | Admitting: Nurse Practitioner

## 2014-05-30 VITALS — BP 118/68 | HR 78 | Temp 97.8°F | Resp 19 | Ht 68.0 in | Wt 195.6 lb

## 2014-05-30 DIAGNOSIS — E876 Hypokalemia: Secondary | ICD-10-CM

## 2014-05-30 DIAGNOSIS — C494 Malignant neoplasm of connective and soft tissue of abdomen: Secondary | ICD-10-CM

## 2014-05-30 DIAGNOSIS — C49A0 Gastrointestinal stromal tumor, unspecified site: Secondary | ICD-10-CM

## 2014-05-30 DIAGNOSIS — C499 Malignant neoplasm of connective and soft tissue, unspecified: Secondary | ICD-10-CM | POA: Insufficient documentation

## 2014-05-30 DIAGNOSIS — D649 Anemia, unspecified: Secondary | ICD-10-CM

## 2014-05-30 LAB — COMPREHENSIVE METABOLIC PANEL (CC13)
ALBUMIN: 3.1 g/dL — AB (ref 3.5–5.0)
ALK PHOS: 85 U/L (ref 40–150)
ALT: <6 U/L (ref 0–55)
AST: 15 U/L (ref 5–34)
Anion Gap: 12 mEq/L — ABNORMAL HIGH (ref 3–11)
BUN: 11.9 mg/dL (ref 7.0–26.0)
CALCIUM: 8.5 mg/dL (ref 8.4–10.4)
CO2: 20 mEq/L — ABNORMAL LOW (ref 22–29)
Chloride: 107 mEq/L (ref 98–109)
Creatinine: 1.3 mg/dL — ABNORMAL HIGH (ref 0.6–1.1)
EGFR: 55 mL/min/{1.73_m2} — ABNORMAL LOW (ref >90)
GLUCOSE: 88 mg/dL (ref 70–140)
POTASSIUM: 4.1 meq/L (ref 3.5–5.1)
SODIUM: 140 meq/L (ref 136–145)
TOTAL PROTEIN: 6.5 g/dL (ref 6.4–8.3)
Total Bilirubin: 0.59 mg/dL (ref 0.20–1.20)

## 2014-05-30 LAB — CBC WITH DIFFERENTIAL/PLATELET
BASO%: 0.7 % (ref 0.0–2.0)
Basophils Absolute: 0 10*3/uL (ref 0.0–0.1)
EOS%: 2.1 % (ref 0.0–7.0)
Eosinophils Absolute: 0.1 10*3/uL (ref 0.0–0.5)
HCT: 23.7 % — ABNORMAL LOW (ref 34.8–46.6)
HGB: 7.5 g/dL — ABNORMAL LOW (ref 11.6–15.9)
LYMPH#: 0.9 10*3/uL (ref 0.9–3.3)
LYMPH%: 30.9 % (ref 14.0–49.7)
MCH: 30.7 pg (ref 25.1–34.0)
MCHC: 31.9 g/dL (ref 31.5–36.0)
MCV: 96.3 fL (ref 79.5–101.0)
MONO#: 0.2 10*3/uL (ref 0.1–0.9)
MONO%: 8.6 % (ref 0.0–14.0)
NEUT%: 57.7 % (ref 38.4–76.8)
NEUTROS ABS: 1.6 10*3/uL (ref 1.5–6.5)
Platelets: 324 10*3/uL (ref 145–400)
RBC: 2.46 10*6/uL — AB (ref 3.70–5.45)
RDW: 15.3 % — ABNORMAL HIGH (ref 11.2–14.5)
WBC: 2.8 10*3/uL — AB (ref 3.9–10.3)

## 2014-05-30 LAB — FECAL OCCULT BLOOD, GUAIAC: Occult Blood: NEGATIVE

## 2014-05-30 LAB — HOLD TUBE, BLOOD BANK

## 2014-05-30 LAB — PREPARE RBC (CROSSMATCH)

## 2014-05-30 NOTE — Progress Notes (Signed)
  [place] OFFICE PROGRESS NOTE   Diagnosis:  Gastrointestinal stromal tumor  INTERVAL HISTORY:   Monica [name] returns as scheduled. She began Gleevec 400 mg daily following her last visit 05/15/2014. She had a single episode of nausea/vomiting last night. Otherwise no significant nausea. No diarrhea. No rash. She overall is feeling better. Appetite is better. She is gaining weight. She continues to have right-sided abdominal pain but notes some improvement. She is taking 1 Vicodin every 4 hours. She denies shortness of breath. She has noted mild ankle edema. She denies any bleeding.  Objective:  Vital signs in last 24 hours:  Blood pressure 118/68, pulse 78, temperature 97.8 F (36.6 C), temperature source Oral, resp. rate 19, height 5\' 8"  (1.727 m), weight 195 lb 9.6 oz (88.724 kg), SpO2 100 %.    HEENT: No thrush or ulcers. Resp: Lungs clear bilaterally. Cardio: Regular rate and rhythm. GI: Firm fullness in the right abdomen with associated tenderness. Smaller area of firm fullness at the left abdomen as well. Vascular: Trace bilateral ankle edema.  Skin: Faint fine rash at the upper back and upper arms.    Lab Results:  Lab Results  Component Value Date   WBC 2.8* 05/30/2014   HGB 7.5* 05/30/2014   HCT 23.7* 05/30/2014   MCV 96.3 05/30/2014   PLT 324 05/30/2014   NEUTROABS 1.6 05/30/2014    Imaging:  No results found.  Medications: I have reviewed the patient's current medications.  Assessment/Plan: 1. Gastrointestinal stromal tumor of the stomach, high grade, stage IIIB (T4 NX), ruptured tumor, status post partial gastrectomy 09/07/2012. She began adjuvant Gleevec 10/19/2012 with a three-year course planned.  She discontinued Gleevec 06/20/2013  CT 01/09/2014 with numerous soft tissue masses filling the abdomen and pelvis consistent with recurrent tumor. Soft tissue masses caused significant narrowing of the gastric lumen. Small and large bowel  loops appeared narrowed/compressed but did not appear to be obstructed. Findings suspicious for right hilar lymphadenopathy or tumor nodularity.   Biopsy of an abdominal mass 01/11/2014 showed sheets of spindle cells with myxoid differentiation. The morphology was similar to the patient's previous gastrointestinal stromal tumor.  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  Gleevec dose increased to 400 mg daily 05/15/2014 2. Multiple sclerosis followed by Dr. Tomi Likens 3. History of Anemia secondary to blood loss from #1. Persistent severe anemia. 4. Renal dysfunction. Improved. 5. History of Hypokalemia-now taking a potassium supplement 6. Pain secondary to the large right abdominal mass with displacement of the right kidney 7. Weight loss   Disposition: [name] appears stable. Appetite and pain are somewhat better. She will continue Gleevec 400 mg daily. She has progressive anemia which is likely multifactorial secondary to Gleevec, renal insufficiency, chronic disease and potentially GI bleeding. She turned in stool cards today. We are arranging for a blood transfusion later this week. She will return for a follow-up visit in approximately 2 weeks. She will call the office in the interim with any problems.  Plan reviewed with Dr. Benay Spice.    Ned Card ANP/GNP-BC   05/30/2014  12:37 PM

## 2014-05-30 NOTE — Telephone Encounter (Signed)
Pt confirmed labs/ov per 05/17 POF, gave pt AVS and Calendar.... KJ, scheduled pt for 2 units of blood w/SD help for Friday 05/20.Marland KitchenMarland KitchenMarland Kitchen

## 2014-06-02 ENCOUNTER — Ambulatory Visit: Payer: Medicaid Other | Admitting: Nutrition

## 2014-06-02 ENCOUNTER — Ambulatory Visit: Payer: Medicaid Other

## 2014-06-02 ENCOUNTER — Ambulatory Visit (HOSPITAL_BASED_OUTPATIENT_CLINIC_OR_DEPARTMENT_OTHER): Payer: Medicaid Other

## 2014-06-02 VITALS — BP 128/79 | HR 73 | Temp 98.5°F | Resp 18

## 2014-06-02 DIAGNOSIS — C49A0 Gastrointestinal stromal tumor, unspecified site: Secondary | ICD-10-CM

## 2014-06-02 DIAGNOSIS — D649 Anemia, unspecified: Secondary | ICD-10-CM | POA: Diagnosis present

## 2014-06-02 DIAGNOSIS — C499 Malignant neoplasm of connective and soft tissue, unspecified: Secondary | ICD-10-CM | POA: Diagnosis not present

## 2014-06-02 DIAGNOSIS — N946 Dysmenorrhea, unspecified: Secondary | ICD-10-CM

## 2014-06-02 LAB — TYPE AND SCREEN
ABO/RH(D): O POS
Antibody Screen: NEGATIVE
Unit division: 0
Unit division: 0

## 2014-06-02 MED ORDER — IBUPROFEN 200 MG PO TABS
400.0000 mg | ORAL_TABLET | Freq: Once | ORAL | Status: AC
  Administered 2014-06-02: 400 mg via ORAL

## 2014-06-02 MED ORDER — IBUPROFEN 200 MG PO TABS
ORAL_TABLET | ORAL | Status: AC
  Filled 2014-06-02: qty 2

## 2014-06-02 MED ORDER — SODIUM CHLORIDE 0.9 % IV SOLN
250.0000 mL | Freq: Once | INTRAVENOUS | Status: AC
  Administered 2014-06-02: 250 mL via INTRAVENOUS

## 2014-06-02 NOTE — Patient Instructions (Signed)

## 2014-06-02 NOTE — Progress Notes (Signed)
Patient was identified to be at risk for malnutrition on the MST secondary to weight loss and poor appetite.  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  She is a patient of Dr. Benay Spice.  Past medical history includes hypertension, hyperlipidemia, MS, dyslipidemia, vitamin D deficiency.  Medications include Gleevec, K-Dur, Compazine, and Zocor.  Labs were reviewed.  Height: 68 inches. Weight: 195.6 pounds. Usual body weight: 218 pounds January 2016. BMI: 29.75.  Patient reports her appetite and oral intake has improved. Patient still wants to gain 10-15 pounds. She struggles with nausea and vomiting but has nausea medication which she takes. Denies difficulty with constipation or diarrhea. Enjoys drinking Ensure Plus or boost plus.  Nutrition diagnosis: Food and nutrition related knowledge deficit related to diagnosis of Gist as evidenced by no prior need for nutrition related information.  Intervention: Educated patient to consume adequate calories and protein in small frequent meals and snacks.   Encouraged weight maintenance. Reviewed strategies for eating if she develops nausea and vomiting. Encourage medication compliance. Provided coupons for ensure and boost. Fact sheets were provided.  Questions were answered.  Teach back method used.  Contact information was given.  Monitoring, evaluation, goals: Patient will tolerate adequate calories and protein to minimize weight loss.  Next visit: Patient will contact me if needed.  There is no follow-up scheduled.  **Disclaimer: This note was dictated with voice recognition software. Similar sounding words can inadvertently be transcribed and this note may contain transcription errors which may not have been corrected upon publication of note.**

## 2014-06-03 LAB — TYPE AND SCREEN
ABO/RH(D): O POS
Antibody Screen: NEGATIVE
UNIT DIVISION: 0
Unit division: 0

## 2014-06-07 ENCOUNTER — Other Ambulatory Visit: Payer: Self-pay

## 2014-06-07 DIAGNOSIS — Z1231 Encounter for screening mammogram for malignant neoplasm of breast: Secondary | ICD-10-CM

## 2014-06-13 ENCOUNTER — Telehealth: Payer: Self-pay | Admitting: *Deleted

## 2014-06-13 MED ORDER — FUROSEMIDE 20 MG PO TABS: 10.0000 mg | ORAL_TABLET | Freq: Every day | ORAL | Status: DC

## 2014-06-13 NOTE — Telephone Encounter (Signed)
PT. REQUESTED A MESSAGE BE SENT TO DR.SHERRILL CONCERNING THE PAIN AND SWELLING IN HER FEET AND ANKLES. IT IS VERY PAINFUL TO WALK. SHE IS ALSO HAVING SOME DIARRHEA, NAUSEA, AND VOMITING WHICH IS CONTROLLED WITH MEDICATION. PT. WOULD LIKE THE GLEEVEC DOSE DECREASED OR BE CHANGED TO A DIFFERENT MEDICATION. PT. HAS LAB AND SEES LISA THOMAS,NP ON 06/15/14.

## 2014-06-13 NOTE — Telephone Encounter (Signed)
Returned call to pt, she denies any redness or pain in legs. Feet and ankles are swollen and feet hurt when she walks. Denies any skin breakdown on feet. She reports she stopped Diclofenac (for back pain) per PCP due to risk for bleeding. Pt reports N/V and diarrhea has resolved.  Reviewed above with Dr. Benay Spice: Order received for Lasix.  Called pt with instructions to continue Gleevec 400 mg daily. Start Lasix for edema. Pt is hesitant to continue same dose of Gleevec. Rationale explained. She stated she will try the lasix.

## 2014-06-15 ENCOUNTER — Ambulatory Visit (HOSPITAL_BASED_OUTPATIENT_CLINIC_OR_DEPARTMENT_OTHER): Payer: Medicaid Other | Admitting: Nurse Practitioner

## 2014-06-15 ENCOUNTER — Other Ambulatory Visit (HOSPITAL_BASED_OUTPATIENT_CLINIC_OR_DEPARTMENT_OTHER): Payer: Medicaid Other

## 2014-06-15 VITALS — BP 119/80 | HR 79 | Temp 98.5°F | Resp 20 | Ht 68.0 in | Wt 193.5 lb

## 2014-06-15 DIAGNOSIS — N289 Disorder of kidney and ureter, unspecified: Secondary | ICD-10-CM | POA: Diagnosis not present

## 2014-06-15 DIAGNOSIS — C494 Malignant neoplasm of connective and soft tissue of abdomen: Secondary | ICD-10-CM

## 2014-06-15 DIAGNOSIS — E876 Hypokalemia: Secondary | ICD-10-CM

## 2014-06-15 DIAGNOSIS — C49A0 Gastrointestinal stromal tumor, unspecified site: Secondary | ICD-10-CM

## 2014-06-15 DIAGNOSIS — D500 Iron deficiency anemia secondary to blood loss (chronic): Secondary | ICD-10-CM

## 2014-06-15 DIAGNOSIS — R600 Localized edema: Secondary | ICD-10-CM

## 2014-06-15 LAB — COMPREHENSIVE METABOLIC PANEL (CC13)
ALK PHOS: 91 U/L (ref 40–150)
ALT: <6 U/L (ref 0–55)
ANION GAP: 8 meq/L (ref 3–11)
AST: 11 U/L (ref 5–34)
Albumin: 3.1 g/dL — ABNORMAL LOW (ref 3.5–5.0)
BILIRUBIN TOTAL: 0.55 mg/dL (ref 0.20–1.20)
BUN: 11.6 mg/dL (ref 7.0–26.0)
CO2: 23 meq/L (ref 22–29)
Calcium: 8.3 mg/dL — ABNORMAL LOW (ref 8.4–10.4)
Chloride: 107 mEq/L (ref 98–109)
Creatinine: 1.4 mg/dL — ABNORMAL HIGH (ref 0.6–1.1)
EGFR: 51 mL/min/{1.73_m2} — ABNORMAL LOW (ref >90)
Glucose: 93 mg/dl (ref 70–140)
Potassium: 3.5 mEq/L (ref 3.5–5.1)
Sodium: 139 mEq/L (ref 136–145)
Total Protein: 6 g/dL — ABNORMAL LOW (ref 6.4–8.3)

## 2014-06-15 LAB — CBC WITH DIFFERENTIAL/PLATELET
BASO%: 0.3 % (ref 0.0–2.0)
Basophils Absolute: 0 10*3/uL (ref 0.0–0.1)
EOS%: 2.4 % (ref 0.0–7.0)
Eosinophils Absolute: 0.1 10*3/uL (ref 0.0–0.5)
HEMATOCRIT: 26.3 % — AB (ref 34.8–46.6)
HGB: 8.6 g/dL — ABNORMAL LOW (ref 11.6–15.9)
LYMPH#: 0.9 10*3/uL (ref 0.9–3.3)
LYMPH%: 25.8 % (ref 14.0–49.7)
MCH: 30.8 pg (ref 25.1–34.0)
MCHC: 32.7 g/dL (ref 31.5–36.0)
MCV: 94.3 fL (ref 79.5–101.0)
MONO#: 0.3 10*3/uL (ref 0.1–0.9)
MONO%: 9.7 % (ref 0.0–14.0)
NEUT#: 2 10*3/uL (ref 1.5–6.5)
NEUT%: 61.8 % (ref 38.4–76.8)
Platelets: 247 10*3/uL (ref 145–400)
RBC: 2.79 10*6/uL — AB (ref 3.70–5.45)
RDW: 15.1 % — ABNORMAL HIGH (ref 11.2–14.5)
WBC: 3.3 10*3/uL — ABNORMAL LOW (ref 3.9–10.3)

## 2014-06-15 LAB — HOLD TUBE, BLOOD BANK

## 2014-06-15 NOTE — Progress Notes (Addendum)
[place] OFFICE PROGRESS NOTE   Diagnosis:  Gastrointestinal stromal tumor  INTERVAL HISTORY:   Monica [name] returns as scheduled. She contacted the office earlier this week to report leg swelling. She was started on Lasix. She has noted no change in the leg swelling. She has pain in her feet with ambulation. She denies any periorbital edema. Abdominal pain is better. She is taking less Vicodin. She notes more nausea/vomiting and loose stools since increasing the dose of Gleevec in early May. The diarrhea is controlled with Imodium.  Objective:  Vital signs in last 24 hours:  Blood pressure 119/80, pulse 79, temperature 98.5 F (36.9 C), temperature source Oral, resp. rate 20, height 5\' 8"  (1.727 m), weight 193 lb 8 oz (87.771 kg), SpO2 100 %.    HEENT: No thrush or ulcers. No periorbital edema. Resp: Lungs clear. Cardio: Regular rate and rhythm. GI: Firm fullness right mid to upper abdomen with associated tenderness. Vascular: Pitting edema at the lower leg/feet bilaterally.   Lab Results:  Lab Results  Component Value Date   WBC 3.3* 06/15/2014   HGB 8.6* 06/15/2014   HCT 26.3* 06/15/2014   MCV 94.3 06/15/2014   PLT 247 06/15/2014   NEUTROABS 2.0 06/15/2014    Imaging:  No results found.  Medications: I have reviewed the patient's current medications.  Assessment/Plan: 1. Gastrointestinal stromal tumor of the stomach, high grade, stage IIIB (T4 NX), ruptured tumor, status post partial gastrectomy 09/07/2012. She began adjuvant Gleevec 10/19/2012 with a three-year course planned.  She discontinued Gleevec 06/20/2013  CT 01/09/2014 with numerous soft tissue masses filling the abdomen and pelvis consistent with recurrent tumor. Soft tissue masses caused significant narrowing of the gastric lumen. Small and large bowel loops appeared narrowed/compressed but did not appear to be obstructed. Findings suspicious for right hilar lymphadenopathy or tumor  nodularity.   Biopsy of an abdominal mass 01/11/2014 showed sheets of spindle cells with myxoid differentiation. The morphology was similar to the patient's previous gastrointestinal stromal tumor.  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  Gleevec dose increased to 400 mg daily 05/15/2014 2. Multiple sclerosis followed by Dr. Tomi Likens 3. History of anemia secondary to blood loss from #1. Persistent severe anemia. Stool negative for occult blood 05/30/2014. She was transfused 2 units of blood on 06/02/2014.  4. Renal dysfunction. Improved. 5. History of Hypokalemia-now taking a potassium supplement 6. Pain secondary to the large right abdominal mass with displacement of the right kidney 7. Weight loss 8. Lower extremity edema. Question related to [place]   Disposition: Monica [name] is currently on Gleevec at a dose of 400 mg daily. Abdominal pain is some better. She continues to have palpable fullness at the right abdomen. She recently developed lower extremity edema which may be related to the [place]. She is also experiencing more nausea and diarrhea also possibly related to the [place].  Dr. Benay Spice recommends changing treatment to sunitinib 37.5 mg daily. She understands to continue [place] until she receives the sunitinib. We reviewed potential toxicities associated with sunitinib including myelosuppression, nausea, mouth sores, rash, hand-foot syndrome, diarrhea, hypertension, blood clots.  We are referring her for restaging CT scans.  She will return for a follow-up visit in approximately 3 weeks. She will contact the office in the interim with any problems.  Patient  seen with Dr. Benay Spice. 25 minutes were spent face-to-face at today's visit with the majority of  that time involved in counseling/coordination of care.   Ned Card ANP/GNP-BC   06/15/2014  4:05 PM  This was a shared visit with Ned Card. Monica [name] would like to switch to a different treatment. I suspect the leg edema is related to the abdominal tumor burden. There is persistent masslike fullness throughout the abdomen. We decided to make a change to sunitinib. I reviewed the potential toxicities associated with this agent and she agrees to proceed. She will return for a baseline CT within the next one week.  Julieanne Manson, M.D.

## 2014-06-16 ENCOUNTER — Telehealth: Payer: Self-pay | Admitting: Nurse Practitioner

## 2014-06-16 NOTE — Telephone Encounter (Signed)
per pof to sch pt appt-cld & spoke to pt to adv of appt time & date

## 2014-06-19 ENCOUNTER — Telehealth: Payer: Self-pay | Admitting: *Deleted

## 2014-06-19 ENCOUNTER — Ambulatory Visit
Admission: RE | Admit: 2014-06-19 | Discharge: 2014-06-19 | Disposition: A | Discharge status: Home / Self Care | Payer: Medicaid Other | Source: Ambulatory Visit

## 2014-06-19 DIAGNOSIS — Z1231 Encounter for screening mammogram for malignant neoplasm of breast: Secondary | ICD-10-CM

## 2014-06-19 NOTE — Telephone Encounter (Signed)
ALSO PT.'S CHEMOTHERAPY MEDICATION NEEDS TO BE FILLED BY Friday. [facility] DOES NOT DELIVER ON THE WEEKEND.

## 2014-06-20 ENCOUNTER — Other Ambulatory Visit: Payer: Self-pay | Admitting: *Deleted

## 2014-06-20 MED ORDER — SUNITINIB MALATE 37.5 MG PO CAPS: 37.5000 mg | ORAL_CAPSULE | Freq: Every day | ORAL | Status: DC

## 2014-06-22 ENCOUNTER — Telehealth: Payer: Self-pay | Admitting: *Deleted

## 2014-06-22 ENCOUNTER — Encounter: Payer: Self-pay | Admitting: Nurse Practitioner

## 2014-06-22 ENCOUNTER — Other Ambulatory Visit: Payer: Self-pay | Admitting: *Deleted

## 2014-06-22 DIAGNOSIS — C49A0 Gastrointestinal stromal tumor, unspecified site: Secondary | ICD-10-CM

## 2014-06-22 MED ORDER — HYDROCODONE-ACETAMINOPHEN 5-325 MG PO TABS: 1.0000 | ORAL_TABLET | ORAL | Status: DC | PRN

## 2014-06-22 NOTE — Progress Notes (Unsigned)
This patient's scan had to be rescheduled because the insurance company DENIED them.  I informed the MD and had them rescheduled a week out a to allow for the medical reconsideration review process. Benedetto Goad

## 2014-06-22 NOTE — Telephone Encounter (Signed)
PT. WOULD LIKE TO PICK UP THE PRESCRIPTION TOMORROW BEFORE HER CT SCAN AT 1:30PM. PLEASE CALL PT. WHEN PRESCRIPTION IS READY.

## 2014-06-22 NOTE — Telephone Encounter (Signed)
Notified pt that re-fill request for Vicodin is ready for p/u.  Pt verbalized understanding.

## 2014-06-23 ENCOUNTER — Ambulatory Visit (HOSPITAL_COMMUNITY): Payer: Medicaid Other

## 2014-06-26 ENCOUNTER — Telehealth: Payer: Self-pay | Admitting: *Deleted

## 2014-06-26 NOTE — Telephone Encounter (Signed)
Oncology Nurse Navigator Documentation  Oncology Nurse Navigator Flowsheets 06/26/2014  Navigator Encounter Type Telephone  Treatment Phase Treatment change-Sutent  Barriers/Navigation Needs Concern over precert for CT scan  Time Spent with Patient 5  Patient reports she picked up her medication on Friday and started it on Saturday. Asking if her Medicaid has approved her CT scan yet? Knows nothing about the reschedule.

## 2014-06-27 ENCOUNTER — Telehealth: Payer: Self-pay | Admitting: *Deleted

## 2014-06-27 ENCOUNTER — Ambulatory Visit (INDEPENDENT_AMBULATORY_CARE_PROVIDER_SITE_OTHER): Payer: Medicaid Other | Admitting: Neurology

## 2014-06-27 ENCOUNTER — Encounter: Payer: Self-pay | Admitting: Neurology

## 2014-06-27 VITALS — BP 120/68 | HR 66 | Resp 16 | Ht 68.0 in | Wt 187.0 lb

## 2014-06-27 DIAGNOSIS — G35 Multiple sclerosis: Secondary | ICD-10-CM | POA: Diagnosis not present

## 2014-06-27 NOTE — Patient Instructions (Signed)
Continue Copaxone Follow up in 6 months.

## 2014-06-27 NOTE — Progress Notes (Signed)
NEUROLOGY FOLLOW UP OFFICE NOTE  Monica [name] [id number]  HISTORY OF PRESENT ILLNESS: Monica [name] is a 49 year old right-handed AA woman with history of gastrointestinal stromal tumor of the stomach status post partial gastrectomy and previously on Gleevac, MS, hypertension, hyperlipidemia, low back pain, mass and vitamin D deficiency who follows up for multiple sclerosis.  Records, last MRI of brain and labs reviewed.  UPDATE:  I have not seen her since July of last year.  She said she was unable to keep any appointments due to her malignant GIT.  She was hospitalized in December and found to have numerous recurrent tumors in the abdomen and pelvis.  She resumed Gleevac, which was subsequently switched to sunitinib malate.  She has suffered from leg edema and anemia, requiring blood transfusion.  She has been feeling fatigued but denies any new focal neurologic deficits.  Last MRI of the brain with and without contrast was from 08/22/13, which showed stable diffuse periventricular white matter disease.  Vitamin D level last July was 34.  Labs from 06/15/14 include WBC 3.3, with normal differential, and Hgb/Hct of 8.6/26.3.  She had a transfusion for the anemia.  LFTs were normal.    HISTORY: In 2008, she had optic neuritis of the left eye, presenting as vision loss, as well as nausea, gait instability and stiffness of the right hand.  She was admitted to the hospital at [facility], where she had MRI and underwent LP.  She was treated with IV steroids and started on Copaxone.  She has done well since that time.  She has no residual deficits.  She has had no other flare-ups.  She urinates frequently, but it seems to be due to keeping hydrated rather than an overactive bladder.  She feels tired at times, but no extreme fatigue.  She has right lumbar radicular pain, which is well-controlled.  She reports some memory problems, namely misplacing items such as her phone, or needing to write down appointments,  but she has no signs suggesting cognitive impairment.  She does not drive.  She is currently unemployed.  Current medications:  Copaxone, diclofenac sodium 75mg  for back and leg pain, nortriptyline 10mg  (takes infrequently)  Past medications:  Gabapentin (she is not aware she was on this).  07/22/11 MRI BRAIN W WO:  multiple supratentorial and infratentorial hyperintensities with no abnormal post-contrast enhancement.  PAST MEDICAL HISTORY: Past Medical History  Diagnosis Date  . Hypertension   . Hyperlipidemia   . MS (multiple sclerosis)   . Low back pain   . Dyslipidemia   . Vitamin D deficiency   . Cancer     MEDICATIONS: Current Outpatient Prescriptions on File Prior to Visit  Medication Sig Dispense Refill  . SUNItinib Malate 37.5 MG CAPS Take 37.5 mg by mouth daily. 30 capsule 0  . COPAXONE 20 MG/ML SOSY injection INJECT 1 ML UNDER THE SKIN DAILY 30 each 6  . ferrous sulfate (FERROUSUL) 325 (65 FE) MG tablet Take 1 tablet (325 mg total) by mouth daily with breakfast. 30 tablet 0  . furosemide (LASIX) 20 MG tablet Take 0.5 tablets (10 mg total) by mouth daily. 10 tablet 0  . HYDROcodone-acetaminophen (NORCO/VICODIN) 5-325 MG per tablet Take 1-2 tablets by mouth every 4 (four) hours as needed for moderate pain. 75 tablet 0  . lisinopril (PRINIVIL,ZESTRIL) 10 MG tablet Take 10 mg by mouth daily.    . potassium chloride SA (K-DUR,KLOR-CON) 20 MEQ tablet Take 1 tablet (20 mEq total) by mouth  daily. (Patient not taking: Reported on 06/27/2014) 30 tablet 1  . prochlorperazine (COMPAZINE) 10 MG tablet Take 1 tablet (10 mg total) by mouth every 6 (six) hours as needed for nausea or vomiting. 30 tablet 1  . simvastatin (ZOCOR) 10 MG tablet Take 10 mg by mouth at bedtime.      No current facility-administered medications on file prior to visit.    ALLERGIES: No Known Allergies  FAMILY HISTORY: Family History  Problem Relation Age of Onset  . Stroke Mother   . Stroke Father   .  Schizophrenia Brother   . Hypertension Brother   . Hypertension Brother     SOCIAL HISTORY: History   Social History  . Marital Status: Single    Spouse Name: N/A  . Number of Children: 1  . Years of Education: hs   Occupational History  . Not on file.   Social History Main Topics  . Smoking status: Former Smoker    Quit date: 01/09/2002  . Smokeless tobacco: Never Used  . Alcohol Use: No  . Drug Use: No  . Sexual Activity: Not Currently   Other Topics Concern  . Not on file   Social History Narrative   Patient is single and lives with son.   Patient son has educational needs.    Patient does not work.    Patient has a high school education    Patient quit smoking in 2005.   Patient also quit drinking.            REVIEW OF SYSTEMS: Constitutional: Fatigue Eyes: No visual changes, double vision, eye pain Ear, nose and throat: No hearing loss, ear pain, nasal congestion, sore throat Cardiovascular: No chest pain, palpitations Respiratory:  No shortness of breath at rest or with exertion, wheezes GastrointestinaI: No nausea, vomiting, diarrhea, abdominal pain, fecal incontinence Genitourinary:  No dysuria, urinary retention or frequency Musculoskeletal:  No neck pain, back pain Integumentary: No rash, pruritus, skin lesions Neurological: as above Psychiatric: No depression, insomnia, anxiety Endocrine: No palpitations, fatigue, diaphoresis, mood swings, change in appetite, change in weight, increased thirst Hematologic/Lymphatic:  Anemia.  Swelling in legs Allergic/Immunologic: no itchy/runny eyes, nasal congestion, recent allergic reactions, rashes  PHYSICAL EXAM: Filed Vitals:   06/27/14 1106  BP: 120/68  Pulse: 66  Resp: 16   General: No acute distress Head:  Normocephalic/atraumatic Eyes:  Fundoscopic exam unremarkable without vessel changes, exudates, hemorrhages or papilledema. Neck: supple, no paraspinal tenderness, full range of motion Heart:   Regular rate and rhythm Lungs:  Clear to auscultation bilaterally Back: No paraspinal tenderness Neurological Exam: alert and oriented to person, place, and time, recent and remote memory intact, fund of knowledge intact, attention and concentration intact, speech fluent and not dysarthric, language intact.  CN II-XII intact.  Bulk and tone normal.  Muscle strength 5/5 throughout.  Pinprick and vibration sensation intact.  3+ right patellar reflex.  Otherwise, 2+ throughout.  Toes downgoing.  Finger to nose and heel to shin intact.  Normal gait.  Timed 25 foot walk 4.81 seconds.  Able to tandem walk.  Romberg negative.  IMPRESSION: Relapsing-remitting MS, stable  PLAN: Continue Copaxone Follow up in 6 months.  Metta Clines, DO  CC: Lucianne Lei, MD

## 2014-06-27 NOTE — Telephone Encounter (Signed)
Oncology Nurse Navigator Documentation  Oncology Nurse Navigator Flowsheets 06/27/2014  Navigator Encounter Type Telephone  Treatment Phase Treatment  Barriers/Navigation Needs Concerns IR:[id number] checking w/authorization specialist made her aware that PA has been completed for her CT scan. Reviewed appointment date/time/prep with her.  Time Spent with Patient 15

## 2014-06-28 ENCOUNTER — Other Ambulatory Visit: Payer: Self-pay | Admitting: *Deleted

## 2014-06-28 MED ORDER — GLATIRAMER ACETATE 20 MG/ML ~~LOC~~ SOSY: 20.0000 mg | PREFILLED_SYRINGE | Freq: Every day | SUBCUTANEOUS | Status: DC

## 2014-06-29 ENCOUNTER — Ambulatory Visit (HOSPITAL_COMMUNITY)
Admission: RE | Admit: 2014-06-29 | Discharge: 2014-06-29 | Disposition: A | Discharge status: Home / Self Care | Payer: Medicaid Other | Source: Ambulatory Visit | Attending: Nurse Practitioner | Admitting: Nurse Practitioner

## 2014-06-29 ENCOUNTER — Encounter (HOSPITAL_COMMUNITY): Payer: Self-pay

## 2014-06-29 DIAGNOSIS — C494 Malignant neoplasm of connective and soft tissue of abdomen: Secondary | ICD-10-CM | POA: Insufficient documentation

## 2014-06-29 DIAGNOSIS — J9811 Atelectasis: Secondary | ICD-10-CM | POA: Diagnosis not present

## 2014-06-29 DIAGNOSIS — C49A0 Gastrointestinal stromal tumor, unspecified site: Secondary | ICD-10-CM

## 2014-07-03 ENCOUNTER — Encounter: Payer: Self-pay | Admitting: *Deleted

## 2014-07-03 ENCOUNTER — Telehealth: Payer: Self-pay

## 2014-07-03 NOTE — Progress Notes (Signed)
Received refill request from Triage regarding Imatinib; treatment has been changed to sunitinib as of 06/20/14. No refill for Imatinib needed.

## 2014-07-03 NOTE — Telephone Encounter (Signed)
Imatinib refill request from [facility] to Dr [facility]

## 2014-07-06 ENCOUNTER — Ambulatory Visit (HOSPITAL_BASED_OUTPATIENT_CLINIC_OR_DEPARTMENT_OTHER): Payer: Medicaid Other | Admitting: Nurse Practitioner

## 2014-07-06 ENCOUNTER — Other Ambulatory Visit (HOSPITAL_BASED_OUTPATIENT_CLINIC_OR_DEPARTMENT_OTHER): Payer: Medicaid Other

## 2014-07-06 ENCOUNTER — Telehealth: Payer: Self-pay | Admitting: Oncology

## 2014-07-06 VITALS — BP 161/96 | HR 72 | Temp 98.4°F | Resp 19 | Ht 68.0 in | Wt 177.2 lb

## 2014-07-06 DIAGNOSIS — D500 Iron deficiency anemia secondary to blood loss (chronic): Secondary | ICD-10-CM | POA: Diagnosis not present

## 2014-07-06 DIAGNOSIS — N289 Disorder of kidney and ureter, unspecified: Secondary | ICD-10-CM

## 2014-07-06 DIAGNOSIS — R600 Localized edema: Secondary | ICD-10-CM | POA: Diagnosis not present

## 2014-07-06 DIAGNOSIS — C494 Malignant neoplasm of connective and soft tissue of abdomen: Secondary | ICD-10-CM | POA: Diagnosis not present

## 2014-07-06 DIAGNOSIS — R634 Abnormal weight loss: Secondary | ICD-10-CM

## 2014-07-06 DIAGNOSIS — E876 Hypokalemia: Secondary | ICD-10-CM

## 2014-07-06 DIAGNOSIS — C49A0 Gastrointestinal stromal tumor, unspecified site: Secondary | ICD-10-CM

## 2014-07-06 LAB — CBC WITH DIFFERENTIAL/PLATELET
BASO%: 0.3 % (ref 0.0–2.0)
BASOS ABS: 0 10*3/uL (ref 0.0–0.1)
EOS%: 2.3 % (ref 0.0–7.0)
Eosinophils Absolute: 0.1 10*3/uL (ref 0.0–0.5)
HEMATOCRIT: 30.2 % — AB (ref 34.8–46.6)
HGB: 9.9 g/dL — ABNORMAL LOW (ref 11.6–15.9)
LYMPH%: 28.2 % (ref 14.0–49.7)
MCH: 30.6 pg (ref 25.1–34.0)
MCHC: 32.8 g/dL (ref 31.5–36.0)
MCV: 93.2 fL (ref 79.5–101.0)
MONO#: 0.3 10*3/uL (ref 0.1–0.9)
MONO%: 8.1 % (ref 0.0–14.0)
NEUT#: 2.4 10*3/uL (ref 1.5–6.5)
NEUT%: 61.1 % (ref 38.4–76.8)
PLATELETS: 305 10*3/uL (ref 145–400)
RBC: 3.24 10*6/uL — AB (ref 3.70–5.45)
RDW: 15.7 % — ABNORMAL HIGH (ref 11.2–14.5)
WBC: 4 10*3/uL (ref 3.9–10.3)
lymph#: 1.1 10*3/uL (ref 0.9–3.3)

## 2014-07-06 LAB — COMPREHENSIVE METABOLIC PANEL (CC13)
ALBUMIN: 3.2 g/dL — AB (ref 3.5–5.0)
ALK PHOS: 155 U/L — AB (ref 40–150)
ALT: 9 U/L (ref 0–55)
AST: 21 U/L (ref 5–34)
Anion Gap: 8 mEq/L (ref 3–11)
BUN: 10.8 mg/dL (ref 7.0–26.0)
CO2: 25 mEq/L (ref 22–29)
Calcium: 9.1 mg/dL (ref 8.4–10.4)
Chloride: 107 mEq/L (ref 98–109)
Creatinine: 1.4 mg/dL — ABNORMAL HIGH (ref 0.6–1.1)
EGFR: 54 mL/min/{1.73_m2} — ABNORMAL LOW (ref >90)
Glucose: 83 mg/dl (ref 70–140)
POTASSIUM: 3.8 meq/L (ref 3.5–5.1)
Sodium: 140 mEq/L (ref 136–145)
TOTAL PROTEIN: 6.5 g/dL (ref 6.4–8.3)
Total Bilirubin: 0.66 mg/dL (ref 0.20–1.20)

## 2014-07-06 MED ORDER — MAGIC MOUTHWASH: 5.0000 mL | Freq: Three times a day (TID) | ORAL | Status: DC

## 2014-07-06 MED ORDER — MEGESTROL ACETATE 40 MG/ML PO SUSP: 200.0000 mg | Freq: Two times a day (BID) | ORAL | Status: DC

## 2014-07-06 NOTE — Progress Notes (Addendum)
[place] OFFICE PROGRESS NOTE   Diagnosis:  Gastrointestinal stromal tumor  INTERVAL HISTORY:   Ms. Czerwinski returns as scheduled. She discontinued Gleevec and began sunitinib about 3 weeks ago. One week ago her mouth became painful. She is having difficulty eating due to the mouth discomfort. She also reports a poor appetite. She is losing weight. No nausea or vomiting. She is having 2 loose stools a day. No skin rash. Leg swelling is better. Abdominal pain is also better.  Objective:  Vital signs in last 24 hours:  Blood pressure 161/96, pulse 72, temperature 98.4 F (36.9 C), temperature source Oral, resp. rate 19, height 5\' 8"  (1.727 m), weight 177 lb 3.2 oz (80.377 kg), last menstrual period 06/02/2014, SpO2 100 %.    HEENT: No thrush or ulcers. Mucous membranes are moist. Resp: Lungs clear bilaterally. Breath sounds are diminished at the bases right greater than left. Cardio: Regular rate and rhythm. GI: Firm fullness right mid to upper abdomen. Vascular: Trace pedal edema.     Lab Results:  Lab Results  Component Value Date   WBC 4.0 07/06/2014   HGB 9.9* 07/06/2014   HCT 30.2* 07/06/2014   MCV 93.2 07/06/2014   PLT 305 07/06/2014   NEUTROABS 2.4 07/06/2014    Imaging:  No results found.  Medications: I have reviewed the patient's current medications.  Assessment/Plan: 1. Gastrointestinal stromal tumor of the stomach, high grade, stage IIIB (T4 NX), ruptured tumor, status post partial gastrectomy 09/07/2012. She began adjuvant Gleevec 10/19/2012 with a three-year course planned.  She discontinued Gleevec 06/20/2013  CT 01/09/2014 with numerous soft tissue masses filling the abdomen and pelvis consistent with recurrent tumor. Soft tissue masses caused significant narrowing of the gastric lumen. Small and large bowel loops appeared narrowed/compressed but did not appear to be obstructed. Findings suspicious for right hilar lymphadenopathy or tumor  nodularity.   Biopsy of an abdominal mass 01/11/2014 showed sheets of spindle cells with myxoid differentiation. The morphology was similar to the patient's previous gastrointestinal stromal tumor.  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  Gleevec dose increased to 400 mg daily 05/15/2014  CT scan 06/29/2014 with progressive large abdominal masses with significant mass effect on the solid abdominal organs and bowel. No bowel obstruction.  Gleevec discontinued and sunitinib initiated 37.5 mg daily following office visit 06/15/2014. 2. Multiple sclerosis followed by Dr. Tomi Likens 3. History of anemia secondary to blood loss from #1. Persistent severe anemia. Stool negative for occult blood 05/30/2014. She was transfused 2 units of blood on 06/02/2014. Improved 07/06/2014. 4. Renal dysfunction. Improved. 5. History of Hypokalemia-now taking a potassium supplement 6. Pain secondary to the large right abdominal mass with displacement of the right kidney 7. Weight loss 8. Lower extremity edema. Question related to [place]. Improved.     Disposition: The recent restaging CT scan showed evidence of progression. Gleevec has been discontinued and she is now on sunitinib. She is experiencing mouth pain likely related to the sunitinib. We sent a perception to her pharmacy for Magic mouthwash. She continues to lose weight secondary to the mouth pain as well as anorexia. She will begin a trial of Megace. She will return for a follow-up visit in 2-3 weeks. She understands to contact the office if the mouth pain worsens. We made a referral  to the [facility] dietitian.  Patient seen with Dr. Benay Spice.  Ned Card ANP/GNP-BC   07/06/2014  1:50 PM   This was a shared visit  with Ned Card. Ms. Kropp was interviewed and examined.   She will continue Sutent and contact us for increased mouth pain. She will begin a trial of Megace as an appetite stimulant. We discussed the potential toxicities associated with Megace. Hopefully the decreased abdominal pain indicates a response to the Sutent. We will be sure she has a repeat blood pressure check within the next few days.  Julieanne Manson, M.D.

## 2014-07-06 NOTE — Telephone Encounter (Signed)
s.w. pt and advised on Julty appt....pt wanted nut appt same day.Marland KitchenMarland KitchenMarland Kitchen

## 2014-07-07 ENCOUNTER — Telehealth: Payer: Self-pay | Admitting: *Deleted

## 2014-07-07 NOTE — Telephone Encounter (Signed)
Called patient to see if she would be able to come back for a blood pressure check today or early next week.  Patient stated that she will not be able to, she has been her 3-4 times this month and was not coming back to have blood pressure checked.  Patient also stated that her BP has been normal.  Informed patient that BP was elevated in office yesterday and needs to be checked again.  Asked patient if she could go to a pharmacy and have BP checked. Patient stated she is not going anywhere to have BP checked.  Informed Elby Showers. Marcello Moores, NP.

## 2014-07-07 NOTE — Telephone Encounter (Signed)
-----   Message from Owens Shark, NP sent at 07/07/2014  9:05 AM EDT ----- With you please call her and see if she can come back for a blood pressure check today or early next week

## 2014-07-18 ENCOUNTER — Telehealth: Payer: Self-pay | Admitting: *Deleted

## 2014-07-18 NOTE — Telephone Encounter (Signed)
VM message received from patient @10 :25 am. TC back to patient. She states she is having problems with new medication Sunitinib-her mouth sores are worse and she is breaking out on her face, head neck, around her ears. She feels miserable. Her appetite feels better but it is hard for her to eat d/t mouth sores. She is using magic mouthwash though she states it doesn't help much. She is also using warm salt water rinses frequently.  She has 3 pills left of the sunitinib. She wants to take a break from it and if she resumes it-she wants it at a lower dosage. Currently she is on 37.5 mg. She is asking if there is a 25 mg tablet.  Overall, she is feeling miserable.

## 2014-07-18 NOTE — Telephone Encounter (Signed)
Attempted to call pt back but no answer. Per Dr. Benay Spice, stop Sunitinib until next appt. Encourage pt to increase fluid intake, and if she needs to be seen before 7/11 we can work her in within the next day or two. Left non-descriptive VM for call back.

## 2014-07-18 NOTE — Telephone Encounter (Signed)
Called patient back and let her know of the instructions below. Pt voices understanding of instructions and knows to call us with any concerns or if she needs IVFs. Is aware of d/t of upcoming appts.

## 2014-07-24 ENCOUNTER — Other Ambulatory Visit (HOSPITAL_BASED_OUTPATIENT_CLINIC_OR_DEPARTMENT_OTHER): Payer: Medicaid Other

## 2014-07-24 ENCOUNTER — Ambulatory Visit (HOSPITAL_BASED_OUTPATIENT_CLINIC_OR_DEPARTMENT_OTHER): Payer: Medicaid Other | Admitting: Nurse Practitioner

## 2014-07-24 ENCOUNTER — Encounter: Payer: Self-pay | Admitting: Nutrition

## 2014-07-24 ENCOUNTER — Encounter: Payer: Medicaid Other | Admitting: Nutrition

## 2014-07-24 VITALS — BP 122/76 | HR 87 | Temp 98.0°F | Resp 18 | Ht 68.0 in | Wt 178.0 lb

## 2014-07-24 DIAGNOSIS — C494 Malignant neoplasm of connective and soft tissue of abdomen: Secondary | ICD-10-CM | POA: Diagnosis not present

## 2014-07-24 DIAGNOSIS — E876 Hypokalemia: Secondary | ICD-10-CM

## 2014-07-24 DIAGNOSIS — R600 Localized edema: Secondary | ICD-10-CM | POA: Diagnosis not present

## 2014-07-24 DIAGNOSIS — N289 Disorder of kidney and ureter, unspecified: Secondary | ICD-10-CM

## 2014-07-24 DIAGNOSIS — C49A0 Gastrointestinal stromal tumor, unspecified site: Secondary | ICD-10-CM

## 2014-07-24 DIAGNOSIS — R634 Abnormal weight loss: Secondary | ICD-10-CM

## 2014-07-24 LAB — COMPREHENSIVE METABOLIC PANEL (CC13)
ALK PHOS: 87 U/L (ref 40–150)
ALT: 7 U/L (ref 0–55)
ANION GAP: 9 meq/L (ref 3–11)
AST: 13 U/L (ref 5–34)
Albumin: 3 g/dL — ABNORMAL LOW (ref 3.5–5.0)
BUN: 15 mg/dL (ref 7.0–26.0)
CO2: 22 mEq/L (ref 22–29)
CREATININE: 1.3 mg/dL — AB (ref 0.6–1.1)
Calcium: 9.2 mg/dL (ref 8.4–10.4)
Chloride: 109 mEq/L (ref 98–109)
EGFR: 57 mL/min/{1.73_m2} — AB (ref >90)
Glucose: 77 mg/dl (ref 70–140)
Potassium: 3.4 mEq/L — ABNORMAL LOW (ref 3.5–5.1)
Sodium: 140 mEq/L (ref 136–145)
Total Bilirubin: 1.09 mg/dL (ref 0.20–1.20)
Total Protein: 6.3 g/dL — ABNORMAL LOW (ref 6.4–8.3)

## 2014-07-24 LAB — CBC WITH DIFFERENTIAL/PLATELET
BASO%: 0.5 % (ref 0.0–2.0)
BASOS ABS: 0 10*3/uL (ref 0.0–0.1)
EOS%: 2.6 % (ref 0.0–7.0)
Eosinophils Absolute: 0.1 10*3/uL (ref 0.0–0.5)
HCT: 28.6 % — ABNORMAL LOW (ref 34.8–46.6)
HGB: 9.4 g/dL — ABNORMAL LOW (ref 11.6–15.9)
LYMPH%: 25.5 % (ref 14.0–49.7)
MCH: 31.4 pg (ref 25.1–34.0)
MCHC: 32.8 g/dL (ref 31.5–36.0)
MCV: 95.8 fL (ref 79.5–101.0)
MONO#: 0.2 10*3/uL (ref 0.1–0.9)
MONO%: 5.6 % (ref 0.0–14.0)
NEUT%: 65.8 % (ref 38.4–76.8)
NEUTROS ABS: 2.2 10*3/uL (ref 1.5–6.5)
Platelets: 239 10*3/uL (ref 145–400)
RBC: 2.99 10*6/uL — ABNORMAL LOW (ref 3.70–5.45)
RDW: 19 % — AB (ref 11.2–14.5)
WBC: 3.4 10*3/uL — ABNORMAL LOW (ref 3.9–10.3)
lymph#: 0.9 10*3/uL (ref 0.9–3.3)

## 2014-07-24 NOTE — Progress Notes (Signed)
Patient decided to cancel nutrition appointment today.   She does not think she needs it.

## 2014-07-24 NOTE — Progress Notes (Signed)
[facility] OFFICE PROGRESS NOTE   Diagnosis:  Gastrointestinal stromal tumor  INTERVAL HISTORY:   Ms. Spake returns as scheduled. She contacted the office on 07/18/2014 to report increased mouth sores and a rash. The sunitinib was placed on hold. The mouth soreness is better. She has not noticed any ulcers. She developed a rash over her chest, face and scalp. Since discontinuing sunitinib the facial and scalp rash has improved. Appetite is better since beginning Megace. No diarrhea. She denies abdominal pain.  Objective:  Vital signs in last 24 hours:  Blood pressure 122/76, pulse 87, temperature 98 F (36.7 C), temperature source Oral, resp. rate 18, height 5\' 8"  (1.727 m), weight 178 lb (80.74 kg), SpO2 100 %.    HEENT: No thrush or ulcers. Mucous membranes are moist. Resp: Lungs clear bilaterally. Breath sounds are diminished at the right lung base. No respiratory distress. Cardio: Regular rate and rhythm. GI: Resistant firm fullness right mid to upper abdomen. Vascular: No leg edema. Skin: Mild acne-like rash scattered over the upper forehead. No pustules. Fine dry appearing scabbed rash scattered over the upper chest and upper back.    Lab Results:  Lab Results  Component Value Date   WBC 3.4* 07/24/2014   HGB 9.4* 07/24/2014   HCT 28.6* 07/24/2014   MCV 95.8 07/24/2014   PLT 239 07/24/2014   NEUTROABS 2.2 07/24/2014    Imaging:  No results found.  Medications: I have reviewed the patient's current medications.  Assessment/Plan: 1. Gastrointestinal stromal tumor of the stomach, high grade, stage IIIB (T4 NX), ruptured tumor, status post partial gastrectomy 09/07/2012. She began adjuvant Gleevec 10/19/2012 with a three-year course planned.  She discontinued Gleevec 06/20/2013  CT 01/09/2014 with numerous soft tissue masses filling the abdomen and pelvis consistent with recurrent tumor. Soft tissue masses caused significant narrowing of the gastric  lumen. Small and large bowel loops appeared narrowed/compressed but did not appear to be obstructed. Findings suspicious for right hilar lymphadenopathy or tumor nodularity.   Biopsy of an abdominal mass 01/11/2014 showed sheets of spindle cells with myxoid differentiation. The morphology was similar to the patient's previous gastrointestinal stromal tumor.  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  Gleevec dose increased to 400 mg daily 05/15/2014  CT scan 06/29/2014 with progressive large abdominal masses with significant mass effect on the solid abdominal organs and bowel. No bowel obstruction.  Gleevec discontinued and sunitinib initiated 37.5 mg daily following office visit 06/15/2014.  Sunitinib placed on hold 07/18/2014 due to mouth soreness and skin rash. 2. Multiple sclerosis followed by Dr. Tomi Likens 3. History of anemia secondary to blood loss from #1. Persistent severe anemia. Stool negative for occult blood 05/30/2014. She was transfused 2 units of blood on 06/02/2014. Improved 07/06/2014. 4. Renal dysfunction. Improved. 5. History of hypokalemia-now taking a potassium supplement 6. Pain secondary to the large right abdominal mass with displacement of the right kidney 7. Weight loss. Improved with Megace. 8. Lower extremity edema. Question related to [place]. Improved.   Disposition: Ms. Politano appears stable. She developed progressive mouth soreness and a skin rash on sunitinib. Both have improved since the sunitinib was placed on hold. She is agreeable to resuming sunitinib with a dose reduction. She is comfortable resuming sunitinib at a dose of 25 mg daily. We will send  a new prescription to her pharmacy.  She will return for a follow-up visit in 3  weeks. She understands to contact the office if the mouth soreness and/or rash worsen or she develops any new problems.  Plan reviewed with Dr. Benay Spice.    Ned Card ANP/GNP-BC   07/24/2014  4:41 PM

## 2014-07-25 ENCOUNTER — Telehealth: Payer: Self-pay | Admitting: Oncology

## 2014-07-25 NOTE — Telephone Encounter (Signed)
Pt couldn't hear me on the phone ask if I could mail schedule, mailed schedule to pt... KJ

## 2014-07-26 ENCOUNTER — Other Ambulatory Visit: Payer: Self-pay | Admitting: *Deleted

## 2014-07-26 DIAGNOSIS — C49A0 Gastrointestinal stromal tumor, unspecified site: Secondary | ICD-10-CM

## 2014-07-26 MED ORDER — SUNITINIB MALATE 25 MG PO CAPS: 25.0000 mg | ORAL_CAPSULE | Freq: Every day | ORAL | Status: DC

## 2014-08-15 ENCOUNTER — Telehealth: Payer: Self-pay | Admitting: Oncology

## 2014-08-15 ENCOUNTER — Other Ambulatory Visit (HOSPITAL_BASED_OUTPATIENT_CLINIC_OR_DEPARTMENT_OTHER): Payer: Medicaid Other

## 2014-08-15 ENCOUNTER — Ambulatory Visit (HOSPITAL_BASED_OUTPATIENT_CLINIC_OR_DEPARTMENT_OTHER): Payer: Medicaid Other | Admitting: Oncology

## 2014-08-15 VITALS — BP 139/77 | HR 68 | Temp 98.3°F | Resp 18 | Ht 68.0 in | Wt 165.9 lb

## 2014-08-15 DIAGNOSIS — E876 Hypokalemia: Secondary | ICD-10-CM

## 2014-08-15 DIAGNOSIS — N289 Disorder of kidney and ureter, unspecified: Secondary | ICD-10-CM

## 2014-08-15 DIAGNOSIS — C494 Malignant neoplasm of connective and soft tissue of abdomen: Secondary | ICD-10-CM

## 2014-08-15 DIAGNOSIS — C49A0 Gastrointestinal stromal tumor, unspecified site: Secondary | ICD-10-CM

## 2014-08-15 DIAGNOSIS — D6481 Anemia due to antineoplastic chemotherapy: Secondary | ICD-10-CM | POA: Diagnosis not present

## 2014-08-15 DIAGNOSIS — R600 Localized edema: Secondary | ICD-10-CM | POA: Diagnosis not present

## 2014-08-15 DIAGNOSIS — D500 Iron deficiency anemia secondary to blood loss (chronic): Secondary | ICD-10-CM | POA: Diagnosis not present

## 2014-08-15 LAB — CBC WITH DIFFERENTIAL/PLATELET
BASO%: 0.4 % (ref 0.0–2.0)
Basophils Absolute: 0 10*3/uL (ref 0.0–0.1)
EOS ABS: 0 10*3/uL (ref 0.0–0.5)
EOS%: 1.1 % (ref 0.0–7.0)
HCT: 26.6 % — ABNORMAL LOW (ref 34.8–46.6)
HGB: 8.6 g/dL — ABNORMAL LOW (ref 11.6–15.9)
LYMPH#: 0.8 10*3/uL — AB (ref 0.9–3.3)
LYMPH%: 27.9 % (ref 14.0–49.7)
MCH: 31.3 pg (ref 25.1–34.0)
MCHC: 32.3 g/dL (ref 31.5–36.0)
MCV: 96.9 fL (ref 79.5–101.0)
MONO#: 0.2 10*3/uL (ref 0.1–0.9)
MONO%: 6.6 % (ref 0.0–14.0)
NEUT%: 64 % (ref 38.4–76.8)
NEUTROS ABS: 1.9 10*3/uL (ref 1.5–6.5)
Platelets: 445 10*3/uL — ABNORMAL HIGH (ref 145–400)
RBC: 2.75 10*6/uL — AB (ref 3.70–5.45)
RDW: 18 % — ABNORMAL HIGH (ref 11.2–14.5)
WBC: 3 10*3/uL — AB (ref 3.9–10.3)

## 2014-08-15 LAB — COMPREHENSIVE METABOLIC PANEL (CC13)
ALT: 12 U/L (ref 0–55)
ANION GAP: 9 meq/L (ref 3–11)
AST: 17 U/L (ref 5–34)
Albumin: 3.3 g/dL — ABNORMAL LOW (ref 3.5–5.0)
Alkaline Phosphatase: 85 U/L (ref 40–150)
BILIRUBIN TOTAL: 0.67 mg/dL (ref 0.20–1.20)
BUN: 15.8 mg/dL (ref 7.0–26.0)
CO2: 22 mEq/L (ref 22–29)
CREATININE: 1.4 mg/dL — AB (ref 0.6–1.1)
Calcium: 9.4 mg/dL (ref 8.4–10.4)
Chloride: 110 mEq/L — ABNORMAL HIGH (ref 98–109)
EGFR: 50 mL/min/{1.73_m2} — AB (ref >90)
Glucose: 67 mg/dl — ABNORMAL LOW (ref 70–140)
POTASSIUM: 3.6 meq/L (ref 3.5–5.1)
SODIUM: 141 meq/L (ref 136–145)
Total Protein: 6.9 g/dL (ref 6.4–8.3)

## 2014-08-15 NOTE — Telephone Encounter (Signed)
Gave and printed appt sched and avs for pt for Aug °

## 2014-08-15 NOTE — Progress Notes (Signed)
[facility] OFFICE PROGRESS NOTE   Diagnosis: Gastrointestinal stromal tumor  INTERVAL HISTORY:   Monica [name] returns as scheduled. She resume sunitinib at a dose of 25 mg daily on 07/31/2014. She reports tolerating the sunitinib well. No rash, mouth sores, or diarrhea. No hand or foot pain. Her appetite has declined since she ran out of Megace. She feels the abdomen is less distended.  Objective:  Vital signs in last 24 hours:  Blood pressure 139/77, pulse 68, temperature 98.3 F (36.8 C), temperature source Oral, resp. rate 18, height 5\' 8"  (1.727 m), weight 165 lb 14.4 oz (75.252 kg), SpO2 100 %.    HEENT: No thrush or ulcers Resp: Lungs clear bilaterally Cardio: Regular rate and rhythm GI: No hepatomegaly, no apparent ascites. Fullness in the right upper and mid abdomen. Firm fullness in the left mid abdomen Vascular: The left lower leg is slightly larger than the right side, no erythema  Skin: No rash, Palms without erythema     Lab Results:  Lab Results  Component Value Date   WBC 3.0* 08/15/2014   HGB 8.6* 08/15/2014   HCT 26.6* 08/15/2014   MCV 96.9 08/15/2014   PLT 445* 08/15/2014   NEUTROABS 1.9 08/15/2014     Medications: I have reviewed the patient's current medications.  Assessment/Plan: 1. Gastrointestinal stromal tumor of the stomach, high grade, stage IIIB (T4 NX), ruptured tumor, status post partial gastrectomy 09/07/2012. She began adjuvant Gleevec 10/19/2012 with a three-year course planned.  She discontinued Gleevec 06/20/2013  CT 01/09/2014 with numerous soft tissue masses filling the abdomen and pelvis consistent with recurrent tumor. Soft tissue masses caused significant narrowing of the gastric lumen. Small and large bowel loops appeared narrowed/compressed but did not appear to be obstructed. Findings suspicious for right hilar lymphadenopathy or tumor nodularity.   Biopsy of an abdominal mass 01/11/2014 showed sheets of spindle  cells with myxoid differentiation. The morphology was similar to the patient's previous gastrointestinal stromal tumor.  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  Gleevec dose increased to 400 mg daily 05/15/2014  CT scan 06/29/2014 with progressive large abdominal masses with significant mass effect on the solid abdominal organs and bowel. No bowel obstruction.  Gleevec discontinued and sunitinib initiated 37.5 mg daily following office visit 06/15/2014.  Sunitinib placed on hold 07/18/2014 due to mouth soreness and skin rash.  Sunitinib resumed at a dose of 25 mg daily on 07/31/2014 2. Multiple sclerosis followed by Dr. Tomi Likens 3. History of anemia secondary to blood loss from #1. Persistent severe anemia. Stool negative for occult blood 05/30/2014. She was transfused 2 units of blood on 06/02/2014. Stable. 4. Renal dysfunction. Improved. 5. History of hypokalemia-now taking a potassium supplement 6. Pain secondary to the large right abdominal mass with displacement of the right kidney 7. Weight loss.  8. Lower extremity edema. Question related to [place]. Improved.  Disposition:  Monica [name] is tolerating the sunitinib well. The abdominal masses appear smaller. The weight loss is in part secondary to decreased leg edema. She will resume Megace. Monica [name] will continue sunitinib at a dose of 25 mg daily. She will return for an office visit in one month. We will plan for a restaging CT after the next office visit.  The anemia is likely multifactorial secondary to chronic disease, chemotherapy,  and renal insufficiency.  Monica Coder, MD  08/15/2014  12:45 PM

## 2014-08-16 ENCOUNTER — Other Ambulatory Visit: Payer: Self-pay | Admitting: *Deleted

## 2014-08-16 DIAGNOSIS — C49A0 Gastrointestinal stromal tumor, unspecified site: Secondary | ICD-10-CM

## 2014-08-16 DIAGNOSIS — R634 Abnormal weight loss: Secondary | ICD-10-CM

## 2014-08-16 MED ORDER — MEGESTROL ACETATE 40 MG/ML PO SUSP: 200.0000 mg | Freq: Two times a day (BID) | ORAL | Status: DC

## 2014-08-21 ENCOUNTER — Other Ambulatory Visit: Payer: Self-pay | Admitting: *Deleted

## 2014-08-21 DIAGNOSIS — C49A0 Gastrointestinal stromal tumor, unspecified site: Secondary | ICD-10-CM

## 2014-08-21 MED ORDER — SUNITINIB MALATE 25 MG PO CAPS: 25.0000 mg | ORAL_CAPSULE | Freq: Every day | ORAL | Status: DC

## 2014-08-21 NOTE — Telephone Encounter (Signed)
VM message received @ 10:24 am  from patient requesting refill on Sunitimib.

## 2014-09-07 ENCOUNTER — Telehealth: Payer: Self-pay | Admitting: *Deleted

## 2014-09-07 ENCOUNTER — Other Ambulatory Visit: Payer: Self-pay | Admitting: *Deleted

## 2014-09-07 DIAGNOSIS — C49A0 Gastrointestinal stromal tumor, unspecified site: Secondary | ICD-10-CM

## 2014-09-07 MED ORDER — HYDROCODONE-ACETAMINOPHEN 5-325 MG PO TABS: 1.0000 | ORAL_TABLET | ORAL | Status: DC | PRN

## 2014-09-07 NOTE — Telephone Encounter (Signed)
Patient called requesting refill from Dr. Benay Spice for Hydrocodone pain medicine.  Return number is [phone].

## 2014-09-07 NOTE — Telephone Encounter (Signed)
Left voice message on pt # that re-fill request is ready for p/u today; call back if any questions.

## 2014-09-11 NOTE — Telephone Encounter (Signed)
Script ready for pick up on 09-07-2014

## 2014-09-12 ENCOUNTER — Other Ambulatory Visit (HOSPITAL_BASED_OUTPATIENT_CLINIC_OR_DEPARTMENT_OTHER): Payer: Medicaid Other

## 2014-09-12 ENCOUNTER — Ambulatory Visit (HOSPITAL_BASED_OUTPATIENT_CLINIC_OR_DEPARTMENT_OTHER): Payer: Medicaid Other | Admitting: Nurse Practitioner

## 2014-09-12 VITALS — BP 140/82 | HR 65 | Temp 98.7°F | Resp 18 | Ht 68.0 in | Wt 168.1 lb

## 2014-09-12 DIAGNOSIS — C49A0 Gastrointestinal stromal tumor, unspecified site: Secondary | ICD-10-CM

## 2014-09-12 DIAGNOSIS — E876 Hypokalemia: Secondary | ICD-10-CM | POA: Diagnosis not present

## 2014-09-12 DIAGNOSIS — D500 Iron deficiency anemia secondary to blood loss (chronic): Secondary | ICD-10-CM

## 2014-09-12 DIAGNOSIS — D494 Neoplasm of unspecified behavior of bladder: Secondary | ICD-10-CM

## 2014-09-12 DIAGNOSIS — N289 Disorder of kidney and ureter, unspecified: Secondary | ICD-10-CM | POA: Diagnosis not present

## 2014-09-12 DIAGNOSIS — C494 Malignant neoplasm of connective and soft tissue of abdomen: Secondary | ICD-10-CM | POA: Diagnosis present

## 2014-09-12 LAB — COMPREHENSIVE METABOLIC PANEL (CC13)
ALBUMIN: 3.2 g/dL — AB (ref 3.5–5.0)
ALK PHOS: 80 U/L (ref 40–150)
ALT: 15 U/L (ref 0–55)
AST: 17 U/L (ref 5–34)
Anion Gap: 8 mEq/L (ref 3–11)
BILIRUBIN TOTAL: 0.66 mg/dL (ref 0.20–1.20)
BUN: 16.4 mg/dL (ref 7.0–26.0)
CO2: 21 mEq/L — ABNORMAL LOW (ref 22–29)
Calcium: 9 mg/dL (ref 8.4–10.4)
Chloride: 112 mEq/L — ABNORMAL HIGH (ref 98–109)
Creatinine: 1.4 mg/dL — ABNORMAL HIGH (ref 0.6–1.1)
EGFR: 50 mL/min/{1.73_m2} — ABNORMAL LOW (ref >90)
GLUCOSE: 81 mg/dL (ref 70–140)
Potassium: 3.6 mEq/L (ref 3.5–5.1)
SODIUM: 141 meq/L (ref 136–145)
TOTAL PROTEIN: 6.8 g/dL (ref 6.4–8.3)

## 2014-09-12 LAB — CBC WITH DIFFERENTIAL/PLATELET
BASO%: 0.4 % (ref 0.0–2.0)
Basophils Absolute: 0 10*3/uL (ref 0.0–0.1)
EOS%: 3.2 % (ref 0.0–7.0)
Eosinophils Absolute: 0.1 10*3/uL (ref 0.0–0.5)
HCT: 27.7 % — ABNORMAL LOW (ref 34.8–46.6)
HEMOGLOBIN: 9.1 g/dL — AB (ref 11.6–15.9)
LYMPH%: 41.4 % (ref 14.0–49.7)
MCH: 33 pg (ref 25.1–34.0)
MCHC: 32.8 g/dL (ref 31.5–36.0)
MCV: 100.8 fL (ref 79.5–101.0)
MONO#: 0.1 10*3/uL (ref 0.1–0.9)
MONO%: 6.8 % (ref 0.0–14.0)
NEUT%: 48.2 % (ref 38.4–76.8)
NEUTROS ABS: 1 10*3/uL — AB (ref 1.5–6.5)
Platelets: 297 10*3/uL (ref 145–400)
RBC: 2.75 10*6/uL — ABNORMAL LOW (ref 3.70–5.45)
RDW: 19.3 % — AB (ref 11.2–14.5)
WBC: 2.2 10*3/uL — AB (ref 3.9–10.3)
lymph#: 0.9 10*3/uL (ref 0.9–3.3)

## 2014-09-12 NOTE — Progress Notes (Signed)
[name] OFFICE PROGRESS NOTE   Diagnosis:  Gastrointestinal stromal tumor  INTERVAL HISTORY:   Monica [name] returns as scheduled. She continues sunitinib 25 mg daily. She feels well. She has a good appetite. She is gaining weight. She is no longer taking Megace. She denies nausea/vomiting. No mouth sores. No diarrhea. No rash. No hand or foot pain or redness. She has mild intermittent abdominal pain. She typically takes hydrocodone once a day.  Objective:  Vital signs in last 24 hours:  Blood pressure 140/82, pulse 65, temperature 98.7 F (37.1 C), temperature source Oral, resp. rate 18, height 5\' 8"  (1.727 m), weight 168 lb 1.6 oz (76.25 kg), SpO2 100 %.    HEENT: No thrush or ulcers. Resp: Lungs clear bilaterally. Cardio: Regular rate and rhythm. GI: Abdomen is soft and nontender. No hepatomegaly. Fullness right upper/mid abdomen. Firm fullness left mid abdomen. Vascular: No leg edema. Skin: No rash. Palms without erythema.    Lab Results:  Lab Results  Component Value Date   WBC 2.2* 09/12/2014   HGB 9.1* 09/12/2014   HCT 27.7* 09/12/2014   MCV 100.8 09/12/2014   PLT 297 09/12/2014   NEUTROABS 1.0* 09/12/2014    Imaging:  No results found.  Medications: I have reviewed the patient's current medications.  Assessment/Plan: 1. Gastrointestinal stromal tumor of the stomach, high grade, stage IIIB (T4 NX), ruptured tumor, status post partial gastrectomy 09/07/2012. She began adjuvant Gleevec 10/19/2012 with a three-year course planned.  She discontinued Gleevec 06/20/2013  CT 01/09/2014 with numerous soft tissue masses filling the abdomen and pelvis consistent with recurrent tumor. Soft tissue masses caused significant narrowing of the gastric lumen. Small and large bowel loops appeared narrowed/compressed but did not appear to be obstructed. Findings suspicious for right hilar lymphadenopathy or tumor nodularity.   Biopsy of an abdominal mass  01/11/2014 showed sheets of spindle cells with myxoid differentiation. The morphology was similar to the patient's previous gastrointestinal stromal tumor.  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  Gleevec dose increased to 400 mg daily 05/15/2014  CT scan 06/29/2014 with progressive large abdominal masses with significant mass effect on the solid abdominal organs and bowel. No bowel obstruction.  Gleevec discontinued and sunitinib initiated 37.5 mg daily following office visit 06/15/2014.  Sunitinib placed on hold 07/18/2014 due to mouth soreness and skin rash.  Sunitinib resumed at a dose of 25 mg daily on 07/31/2014 2. Multiple sclerosis followed by Dr. Tomi Likens 3. History of anemia secondary to blood loss from #1. Persistent severe anemia. Stool negative for occult blood 05/30/2014. She was transfused 2 units of blood on 06/02/2014. Stable. 4. Renal dysfunction. Improved. 5. History of hypokalemia-now taking a potassium supplement 6. Pain secondary to the large right abdominal mass with displacement of the right kidney 7. Weight loss.  8. Lower extremity edema. Question related to [place]. Improved.   Disposition: [name] appears stable. She will continue sunitinib 25 mg daily.   We discussed the neutropenia on labs today. We reviewed neutropenic precautions. She understands to contact the office with fever or other signs of infection. She will return in 2 weeks for a follow-up CBC.  We will obtain restaging CT scans in 4 weeks with a follow-up visit a few days after the scans to review the results. She will contact  the office in the interim as outlined above or with any other problems.  Plan reviewed with Dr. Benay Spice.   Ned Card ANP/GNP-BC    09/12/2014  2:04 PM

## 2014-09-22 ENCOUNTER — Telehealth: Payer: Self-pay | Admitting: *Deleted

## 2014-09-22 DIAGNOSIS — C49A0 Gastrointestinal stromal tumor, unspecified site: Secondary | ICD-10-CM

## 2014-09-22 MED ORDER — SUNITINIB MALATE 25 MG PO CAPS: 25.0000 mg | ORAL_CAPSULE | Freq: Every day | ORAL | Status: DC

## 2014-09-22 NOTE — Telephone Encounter (Signed)
Received Triage VM from pt requesting sunitinib refill; pt currently on med; refill sent to [facility]; called pt to make her aware that this RN called in prescription, no answer. Left message on personal VM stating refill has been called in.

## 2014-09-26 ENCOUNTER — Other Ambulatory Visit (HOSPITAL_BASED_OUTPATIENT_CLINIC_OR_DEPARTMENT_OTHER): Payer: Medicaid Other

## 2014-09-26 DIAGNOSIS — C494 Malignant neoplasm of connective and soft tissue of abdomen: Secondary | ICD-10-CM | POA: Diagnosis not present

## 2014-09-26 DIAGNOSIS — C49A0 Gastrointestinal stromal tumor, unspecified site: Secondary | ICD-10-CM

## 2014-09-26 DIAGNOSIS — D500 Iron deficiency anemia secondary to blood loss (chronic): Secondary | ICD-10-CM | POA: Diagnosis not present

## 2014-09-26 DIAGNOSIS — E876 Hypokalemia: Secondary | ICD-10-CM | POA: Diagnosis not present

## 2014-09-26 DIAGNOSIS — N289 Disorder of kidney and ureter, unspecified: Secondary | ICD-10-CM

## 2014-09-26 LAB — CBC WITH DIFFERENTIAL/PLATELET
BASO%: 0.4 % (ref 0.0–2.0)
BASOS ABS: 0 10*3/uL (ref 0.0–0.1)
EOS%: 2.3 % (ref 0.0–7.0)
Eosinophils Absolute: 0.1 10*3/uL (ref 0.0–0.5)
HEMATOCRIT: 28.9 % — AB (ref 34.8–46.6)
HGB: 9.3 g/dL — ABNORMAL LOW (ref 11.6–15.9)
LYMPH%: 37.7 % (ref 14.0–49.7)
MCH: 32.4 pg (ref 25.1–34.0)
MCHC: 32.1 g/dL (ref 31.5–36.0)
MCV: 101 fL (ref 79.5–101.0)
MONO#: 0.2 10*3/uL (ref 0.1–0.9)
MONO%: 6.7 % (ref 0.0–14.0)
NEUT#: 1.3 10*3/uL — ABNORMAL LOW (ref 1.5–6.5)
NEUT%: 52.9 % (ref 38.4–76.8)
Platelets: 283 10*3/uL (ref 145–400)
RBC: 2.86 10*6/uL — AB (ref 3.70–5.45)
RDW: 18 % — ABNORMAL HIGH (ref 11.2–14.5)
WBC: 2.5 10*3/uL — ABNORMAL LOW (ref 3.9–10.3)
lymph#: 0.9 10*3/uL (ref 0.9–3.3)

## 2014-10-09 ENCOUNTER — Ambulatory Visit: Payer: Medicaid Other | Admitting: Internal Medicine

## 2014-10-10 ENCOUNTER — Encounter (HOSPITAL_COMMUNITY): Payer: Self-pay

## 2014-10-10 ENCOUNTER — Ambulatory Visit (HOSPITAL_COMMUNITY)
Admission: RE | Admit: 2014-10-10 | Discharge: 2014-10-10 | Disposition: A | Discharge status: Home / Self Care | Payer: Medicaid Other | Source: Ambulatory Visit | Attending: Nurse Practitioner | Admitting: Nurse Practitioner

## 2014-10-10 ENCOUNTER — Other Ambulatory Visit (HOSPITAL_BASED_OUTPATIENT_CLINIC_OR_DEPARTMENT_OTHER): Payer: Medicaid Other

## 2014-10-10 DIAGNOSIS — D500 Iron deficiency anemia secondary to blood loss (chronic): Secondary | ICD-10-CM | POA: Diagnosis not present

## 2014-10-10 DIAGNOSIS — K8020 Calculus of gallbladder without cholecystitis without obstruction: Secondary | ICD-10-CM | POA: Diagnosis not present

## 2014-10-10 DIAGNOSIS — C499 Malignant neoplasm of connective and soft tissue, unspecified: Secondary | ICD-10-CM | POA: Diagnosis not present

## 2014-10-10 DIAGNOSIS — N289 Disorder of kidney and ureter, unspecified: Secondary | ICD-10-CM

## 2014-10-10 DIAGNOSIS — C49A0 Gastrointestinal stromal tumor, unspecified site: Secondary | ICD-10-CM

## 2014-10-10 DIAGNOSIS — E876 Hypokalemia: Secondary | ICD-10-CM | POA: Diagnosis not present

## 2014-10-10 DIAGNOSIS — C494 Malignant neoplasm of connective and soft tissue of abdomen: Secondary | ICD-10-CM

## 2014-10-10 LAB — CBC WITH DIFFERENTIAL/PLATELET
BASO%: 0.4 % (ref 0.0–2.0)
BASOS ABS: 0 10*3/uL (ref 0.0–0.1)
EOS%: 5.3 % (ref 0.0–7.0)
Eosinophils Absolute: 0.1 10*3/uL (ref 0.0–0.5)
HCT: 28 % — ABNORMAL LOW (ref 34.8–46.6)
HGB: 9 g/dL — ABNORMAL LOW (ref 11.6–15.9)
LYMPH%: 29.7 % (ref 14.0–49.7)
MCH: 32.5 pg (ref 25.1–34.0)
MCHC: 32 g/dL (ref 31.5–36.0)
MCV: 101.4 fL — ABNORMAL HIGH (ref 79.5–101.0)
MONO#: 0.2 10*3/uL (ref 0.1–0.9)
MONO%: 7.1 % (ref 0.0–14.0)
NEUT#: 1.5 10*3/uL (ref 1.5–6.5)
NEUT%: 57.5 % (ref 38.4–76.8)
Platelets: 266 10*3/uL (ref 145–400)
RBC: 2.76 10*6/uL — AB (ref 3.70–5.45)
RDW: 17.7 % — ABNORMAL HIGH (ref 11.2–14.5)
WBC: 2.5 10*3/uL — ABNORMAL LOW (ref 3.9–10.3)
lymph#: 0.7 10*3/uL — ABNORMAL LOW (ref 0.9–3.3)

## 2014-10-10 LAB — COMPREHENSIVE METABOLIC PANEL (CC13)
ALT: 10 U/L (ref 0–55)
ANION GAP: 9 meq/L (ref 3–11)
AST: 17 U/L (ref 5–34)
Albumin: 3.1 g/dL — ABNORMAL LOW (ref 3.5–5.0)
Alkaline Phosphatase: 69 U/L (ref 40–150)
BUN: 15.2 mg/dL (ref 7.0–26.0)
CALCIUM: 9 mg/dL (ref 8.4–10.4)
CHLORIDE: 109 meq/L (ref 98–109)
CO2: 23 meq/L (ref 22–29)
CREATININE: 1.5 mg/dL — AB (ref 0.6–1.1)
EGFR: 46 mL/min/{1.73_m2} — ABNORMAL LOW (ref >90)
Glucose: 85 mg/dl (ref 70–140)
POTASSIUM: 3.6 meq/L (ref 3.5–5.1)
Sodium: 141 mEq/L (ref 136–145)
Total Bilirubin: 0.51 mg/dL (ref 0.20–1.20)
Total Protein: 6.5 g/dL (ref 6.4–8.3)

## 2014-10-12 ENCOUNTER — Encounter: Payer: Self-pay | Admitting: *Deleted

## 2014-10-12 ENCOUNTER — Ambulatory Visit (HOSPITAL_BASED_OUTPATIENT_CLINIC_OR_DEPARTMENT_OTHER): Payer: Medicaid Other | Admitting: Nurse Practitioner

## 2014-10-12 VITALS — BP 146/94 | HR 76 | Temp 98.0°F | Resp 18 | Ht 68.0 in | Wt 172.4 lb

## 2014-10-12 DIAGNOSIS — E876 Hypokalemia: Secondary | ICD-10-CM

## 2014-10-12 DIAGNOSIS — D500 Iron deficiency anemia secondary to blood loss (chronic): Secondary | ICD-10-CM

## 2014-10-12 DIAGNOSIS — C494 Malignant neoplasm of connective and soft tissue of abdomen: Secondary | ICD-10-CM | POA: Diagnosis present

## 2014-10-12 DIAGNOSIS — G893 Neoplasm related pain (acute) (chronic): Secondary | ICD-10-CM | POA: Diagnosis not present

## 2014-10-12 DIAGNOSIS — C49A0 Gastrointestinal stromal tumor, unspecified site: Secondary | ICD-10-CM

## 2014-10-12 NOTE — Progress Notes (Signed)
Oncology Nurse Navigator Documentation  Oncology Nurse Navigator Flowsheets 10/12/2014  Navigator Encounter Type 3 month  Patient Visit Type Medonc  Treatment Phase Treatment-Sutent 25mg   Barriers/Navigation Needs Transportation  Interventions Referral to CSW  Time Spent with Patient 5  Klee reports only barrier to care is transportation. She would like to renew her SCAT application. Will request social worker start this process for her. No problem with her chemotherapy-co pay is $3. Says her son is doing well.

## 2014-10-12 NOTE — Progress Notes (Addendum)
[name]   Diagnosis:  Gastrointestinal stromal tumor  INTERVAL HISTORY:   Monica [name] returns as scheduled. She continues sunitinib. She denies nausea/vomiting. No mouth sores. She does note that her mouth intermittently feels "sore" when she eats certain foods. She had diarrhea and increased abdominal pain last week. She was having loose stools 3-4 times a day with no response to Imodium. The diarrhea has resolved. No significant abdominal pain. She occasionally takes Vicodin. No hand or foot pain or redness. She has noted some small "bumps" on her face.  Objective:  Vital signs in last 24 hours:  Blood pressure 146/94, pulse 76, temperature 98 F (36.7 C), temperature source Oral, resp. rate 18, height 5\' 8"  (1.727 m), weight 172 lb 6.4 oz (78.2 kg), SpO2 98 %.    HEENT: No thrush or ulcers. Resp: Lungs clear bilaterally. Cardio: Regular rate and rhythm. GI: Abdomen is soft, nontender. No hepatomegaly. Fullness right upper abdomen. Vascular: Trace lower leg edema bilaterally. Calves soft and nontender.  Skin: Faint fine rash at the upper chest. No significant facial rash.    Lab Results:  Lab Results  Component Value Date   WBC 2.5* 10/10/2014   HGB 9.0* 10/10/2014   HCT 28.0* 10/10/2014   MCV 101.4* 10/10/2014   PLT 266 10/10/2014   NEUTROABS 1.5 10/10/2014    Imaging:  No results found.  Medications: I have reviewed the patient's current medications.  Assessment/Plan: 1. Gastrointestinal stromal tumor of the stomach, high grade, stage IIIB (T4 NX), ruptured tumor, status post partial gastrectomy 09/07/2012. She began adjuvant Gleevec 10/19/2012 with a three-year course planned.  She discontinued Gleevec 06/20/2013  CT 01/09/2014 with numerous soft tissue masses filling the abdomen and pelvis consistent with recurrent tumor. Soft tissue masses caused significant narrowing of the gastric lumen. Small and large bowel loops  appeared narrowed/compressed but did not appear to be obstructed. Findings suspicious for right hilar lymphadenopathy or tumor nodularity.   Biopsy of an abdominal mass 01/11/2014 showed sheets of spindle cells with myxoid differentiation. The morphology was similar to the patient's previous gastrointestinal stromal tumor.  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  Gleevec dose increased to 400 mg daily 05/15/2014  CT scan 06/29/2014 with progressive large abdominal masses with significant mass effect on the solid abdominal organs and bowel. No bowel obstruction.  Gleevec discontinued and sunitinib initiated 37.5 mg daily following office visit 06/15/2014.  Sunitinib placed on hold 07/18/2014 due to mouth soreness and skin rash.  Sunitinib resumed at a dose of 25 mg daily on 07/31/2014  CT abdomen/pelvis 10/10/2014 showed redemonstration of massive low-density abdominal/peritoneal masses. Dominant right-sided mass slightly decreased in size. Left-sided dominant mass similar.  Sunitinib continued 25 mg daily. 2. Multiple sclerosis followed by Dr. Tomi Likens 3. History of anemia secondary to blood loss from #1. Persistent severe anemia. Stool negative for occult blood 05/30/2014. She was transfused 2 units of blood on 06/02/2014. Stable. 4. Renal dysfunction. Improved. 5. History of hypokalemia-now taking a potassium supplement 6. Pain secondary to the large right abdominal mass with displacement of the right kidney 7. Weight loss.  8. Lower extremity edema. Question related to [place]. Improved.   Disposition: Monica [name] appears well. The recent restaging CT evaluation was stable to mildly improved. Clinically she is better. Pain  is significantly improved. She will  continue sunitinib 25 mg daily. Next restaging CT evaluation will be at a 3 to four-month interval.  She will return for a follow-up visit and labs in one month. She will contact the office in the interim with any problems.  Patient seen with Dr. Benay Spice.    Ned Card ANP/GNP-BC   10/12/2014  2:15 PM  This was a shared visit with Ned Card. Monica [name] was interviewed and examined.  The restaging CT reveals stable to mildly improved disease. Her clinical status has improved. The plan is to continue sunitinib.  Julieanne Manson, M.D.

## 2014-10-13 ENCOUNTER — Encounter: Payer: Self-pay | Admitting: *Deleted

## 2014-10-13 ENCOUNTER — Telehealth: Payer: Self-pay | Admitting: Oncology

## 2014-10-13 NOTE — Telephone Encounter (Signed)
pt line busy.....mailed pt appt sched/avs and letter °

## 2014-10-13 NOTE — Progress Notes (Signed)
Monica [name]  Clinical Social [name] was referred by patient navigator for assessment of psychosocial needs due to transportation concerns.  Clinical Social Worker contacted patient at home to offer support and assess for needs.  Pt reports her SCAT certification recently expired and she is interested in getting re-certified through [employer]. Pt also has [employer] and is aware of how to use that transportation option as well. Pt plans to call [employer] and see about using their resources currently. CSW mailing pt SCAT application for completion and [employer] numbers as well. Pt agrees to try to complete and then return to CSW for completion at next appt at [facility]. Pt aware to call if she has questions or concerns.   Clinical Social [name] interventions: Resource education and assistance  Monica [name], [place] Worker [place]  [place] Phone: [phone] Fax: [phone]

## 2014-10-17 ENCOUNTER — Other Ambulatory Visit: Payer: Self-pay

## 2014-10-17 DIAGNOSIS — C49A0 Gastrointestinal stromal tumor, unspecified site: Secondary | ICD-10-CM

## 2014-10-17 MED ORDER — SUNITINIB MALATE 25 MG PO CAPS: 25.0000 mg | ORAL_CAPSULE | Freq: Every day | ORAL | Status: DC

## 2014-10-17 NOTE — Telephone Encounter (Signed)
Called Monica [name] and told her that the Sutent prescription was sent to [facility] Pt. to continue on this oral therapy per NP  Ned Card' office note on 10-12-14.

## 2014-11-08 ENCOUNTER — Other Ambulatory Visit: Payer: Self-pay | Admitting: *Deleted

## 2014-11-08 MED ORDER — HYDROCODONE-ACETAMINOPHEN 5-325 MG PO TABS: 1.0000 | ORAL_TABLET | ORAL | Status: DC | PRN

## 2014-11-08 NOTE — Telephone Encounter (Signed)
Notified pt that re-fill for hydrocodone is ready for p/u.  Pt verbalized understanding and expressed appreciation for call back.

## 2014-11-09 ENCOUNTER — Encounter: Payer: Self-pay | Admitting: *Deleted

## 2014-11-09 ENCOUNTER — Other Ambulatory Visit (HOSPITAL_BASED_OUTPATIENT_CLINIC_OR_DEPARTMENT_OTHER): Payer: Medicaid Other

## 2014-11-09 ENCOUNTER — Ambulatory Visit (HOSPITAL_BASED_OUTPATIENT_CLINIC_OR_DEPARTMENT_OTHER): Payer: Medicaid Other | Admitting: Oncology

## 2014-11-09 VITALS — BP 140/83 | HR 64 | Temp 98.6°F | Resp 17 | Ht 68.0 in | Wt 168.2 lb

## 2014-11-09 DIAGNOSIS — C49A0 Gastrointestinal stromal tumor, unspecified site: Secondary | ICD-10-CM

## 2014-11-09 DIAGNOSIS — E876 Hypokalemia: Secondary | ICD-10-CM

## 2014-11-09 DIAGNOSIS — C49A2 Gastrointestinal stromal tumor of stomach: Secondary | ICD-10-CM

## 2014-11-09 DIAGNOSIS — G893 Neoplasm related pain (acute) (chronic): Secondary | ICD-10-CM | POA: Diagnosis not present

## 2014-11-09 DIAGNOSIS — D649 Anemia, unspecified: Secondary | ICD-10-CM

## 2014-11-09 DIAGNOSIS — N289 Disorder of kidney and ureter, unspecified: Secondary | ICD-10-CM | POA: Diagnosis not present

## 2014-11-09 LAB — CBC WITH DIFFERENTIAL/PLATELET
BASO%: 0.5 % (ref 0.0–2.0)
Basophils Absolute: 0 10*3/uL (ref 0.0–0.1)
EOS%: 3.4 % (ref 0.0–7.0)
Eosinophils Absolute: 0.1 10*3/uL (ref 0.0–0.5)
HEMATOCRIT: 29.1 % — AB (ref 34.8–46.6)
HEMOGLOBIN: 9.3 g/dL — AB (ref 11.6–15.9)
LYMPH#: 0.8 10*3/uL — AB (ref 0.9–3.3)
LYMPH%: 36.2 % (ref 14.0–49.7)
MCH: 32.9 pg (ref 25.1–34.0)
MCHC: 32.1 g/dL (ref 31.5–36.0)
MCV: 102.5 fL — AB (ref 79.5–101.0)
MONO#: 0.2 10*3/uL (ref 0.1–0.9)
MONO%: 6.8 % (ref 0.0–14.0)
NEUT%: 53.1 % (ref 38.4–76.8)
NEUTROS ABS: 1.2 10*3/uL — AB (ref 1.5–6.5)
PLATELETS: 259 10*3/uL (ref 145–400)
RBC: 2.84 10*6/uL — ABNORMAL LOW (ref 3.70–5.45)
RDW: 16.2 % — ABNORMAL HIGH (ref 11.2–14.5)
WBC: 2.3 10*3/uL — ABNORMAL LOW (ref 3.9–10.3)

## 2014-11-09 LAB — COMPREHENSIVE METABOLIC PANEL (CC13)
ALBUMIN: 3.2 g/dL — AB (ref 3.5–5.0)
ALK PHOS: 74 U/L (ref 40–150)
ALT: 10 U/L (ref 0–55)
ANION GAP: 7 meq/L (ref 3–11)
AST: 17 U/L (ref 5–34)
BILIRUBIN TOTAL: 0.5 mg/dL (ref 0.20–1.20)
BUN: 16.4 mg/dL (ref 7.0–26.0)
CALCIUM: 9.2 mg/dL (ref 8.4–10.4)
CO2: 26 mEq/L (ref 22–29)
CREATININE: 1.4 mg/dL — AB (ref 0.6–1.1)
Chloride: 109 mEq/L (ref 98–109)
EGFR: 53 mL/min/{1.73_m2} — AB (ref >90)
GLUCOSE: 74 mg/dL (ref 70–140)
POTASSIUM: 3.9 meq/L (ref 3.5–5.1)
Sodium: 142 mEq/L (ref 136–145)
Total Protein: 6.8 g/dL (ref 6.4–8.3)

## 2014-11-09 NOTE — Progress Notes (Signed)
Monica [name]  Clinical Social [name] was referred by need for CSW to follow up with pt and complete SCAT application. SCAt application completed and submitted today. Pt reports she also uses [employer] for transportation as needed and that is how she got here today. Pt having issues affording her [employer] copay and was educated about how to see about assistance through financial counselors. Pt plans to follow up. CSW also provided pt with additional [place] resources to assist her.  She denies other concerns currently and agrees to reach out for other support as needed.   Clinical Social [name] interventions: Diplomatic Services operational officer assistance  Monica [name], [place] Social Worker [place]  [place] Phone: [phone] Fax: [phone]

## 2014-11-09 NOTE — Progress Notes (Signed)
  [facility] OFFICE PROGRESS NOTE   Diagnosis: Gastrointestinal stromal tumor  INTERVAL HISTORY:   Monica [name] returns as scheduled. She continues Sutent. She reports burning of the tongue that does not interfere with activity. She takes approximately 2 hydrocodone tablet per day for relief of right abdominal pain. She has diarrhea twice daily.  Objective:  Vital signs in last 24 hours:  Blood pressure 140/83, pulse 64, temperature 98.6 F (37 C), temperature source Oral, resp. rate 17, height 5\' 8"  (1.727 m), weight 168 lb 3.2 oz (76.295 kg), SpO2 99 %.    HEENT: No thrush or ulcers. Resp: Decreased breath sounds at the right posterior base, no respiratory distress Cardio: Regular rate and rhythm GI: No hepatomegaly, no apparent ascites, mildly distended, firmness in the right mid abdomen Vascular: No leg edema   Lab Results:  Lab Results  Component Value Date   WBC 2.3* 11/09/2014   HGB 9.3* 11/09/2014   HCT 29.1* 11/09/2014   MCV 102.5* 11/09/2014   PLT 259 11/09/2014   NEUTROABS 1.2* 11/09/2014     Medications: I have reviewed the patient's current medications.  Assessment/Plan: 1. Gastrointestinal stromal tumor of the stomach, high grade, stage IIIB (T4 NX), ruptured tumor, status post partial gastrectomy 09/07/2012. She began adjuvant Gleevec 10/19/2012 with a three-year course planned.  She discontinued Gleevec 06/20/2013  CT 01/09/2014 with numerous soft tissue masses filling the abdomen and pelvis consistent with recurrent tumor. Soft tissue masses caused significant narrowing of the gastric lumen. Small and large bowel loops appeared narrowed/compressed but did not appear to be obstructed. Findings suspicious for right hilar lymphadenopathy or tumor nodularity.   Biopsy of an abdominal mass 01/11/2014 showed sheets of spindle cells with myxoid differentiation. The morphology was similar to the patient's previous gastrointestinal stromal  tumor.  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  Gleevec dose increased to 400 mg daily 05/15/2014  CT scan 06/29/2014 with progressive large abdominal masses with significant mass effect on the solid abdominal organs and bowel. No bowel obstruction.  Gleevec discontinued and sunitinib initiated 37.5 mg daily following office visit 06/15/2014.  Sunitinib placed on hold 07/18/2014 due to mouth soreness and skin rash.  Sunitinib resumed at a dose of 25 mg daily on 07/31/2014  CT abdomen/pelvis 10/10/2014 showed redemonstration of massive low-density abdominal/peritoneal masses. Dominant right-sided mass slightly decreased in size. Left-sided dominant mass similar.  Sunitinib continued 25 mg daily. 2. Multiple sclerosis followed by Dr. Tomi Likens 3. History of anemia secondary to blood loss from #1. Persistent severe anemia. Stool negative for occult blood 05/30/2014. She was transfused 2 units of blood on 06/02/2014. Stable. 4. Renal dysfunction. Improved. 5. History of hypokalemia-now taking a potassium supplement 6. Pain secondary to the large right abdominal mass with displacement of the right kidney 7. Weight loss.  8. Lower extremity edema. Question related to [place]. Improved. 9.    Disposition:  She appears stable. She will continue Sutent. She will contact us for a fever. She will return for a CBC and influenza vaccine in 2 weeks. Ms. Balzarini will be scheduled for an office visit in approximately 5 weeks.  Betsy Coder, MD  11/09/2014  4:29 PM

## 2014-11-10 ENCOUNTER — Telehealth: Payer: Self-pay | Admitting: Oncology

## 2014-11-10 NOTE — Telephone Encounter (Signed)
S.w. Pt and advised on NOV appts....the patient ok and aware

## 2014-11-16 ENCOUNTER — Telehealth: Payer: Self-pay | Admitting: *Deleted

## 2014-11-16 NOTE — Telephone Encounter (Signed)
Message from pt requesting office visit to discuss respiratory issues she has been having. This has been on-going for about two years. Has a burning sensation on inhalation. Pt did not think it was related to chemo. New PCP thinks it may be related to chemo and recommends discussing with Dr. Benay Spice before seeing pulmonology.   Returned call to pt, she denies any recent changes. This is on-going. Denies dyspnea. She reports OTC "[place]" lozenges seem to help. Recommended she come in as scheduled 11/29 to be evaluated by APP. She agrees to do so, stated she was unaware of office visit appt.  Dr. Benay Spice made aware, have pt call the office for dyspnea. She voiced understanding.

## 2014-11-17 NOTE — Telephone Encounter (Signed)
Returned call to pt, instructed her to continue Sutent as ordered until office visit. Dr. Benay Spice is monitoring blood counts while on the medicine. She agrees to do so. She does not remember any change in the burning on inspiration when switched from [place] to Sutent.

## 2014-11-17 NOTE — Telephone Encounter (Signed)
Message from pt stating she is planning to stop Sutent when she sees Dr. Benay Spice. Her new PCP advised her to stop this medicine because it can affect her WBC. Wants MD to "be prepared to stop" the Sutent. Will forward to providers.

## 2014-11-20 ENCOUNTER — Other Ambulatory Visit: Payer: Self-pay | Admitting: *Deleted

## 2014-11-20 DIAGNOSIS — C49A0 Gastrointestinal stromal tumor, unspecified site: Secondary | ICD-10-CM

## 2014-11-20 MED ORDER — SUNITINIB MALATE 25 MG PO CAPS: 25.0000 mg | ORAL_CAPSULE | Freq: Every day | ORAL | Status: DC

## 2014-11-23 ENCOUNTER — Ambulatory Visit: Payer: Medicaid Other

## 2014-11-23 ENCOUNTER — Ambulatory Visit (HOSPITAL_BASED_OUTPATIENT_CLINIC_OR_DEPARTMENT_OTHER): Payer: Medicaid Other

## 2014-11-23 ENCOUNTER — Other Ambulatory Visit (HOSPITAL_BASED_OUTPATIENT_CLINIC_OR_DEPARTMENT_OTHER): Payer: Medicaid Other

## 2014-11-23 ENCOUNTER — Encounter: Payer: Self-pay | Admitting: Oncology

## 2014-11-23 ENCOUNTER — Telehealth: Payer: Self-pay | Admitting: *Deleted

## 2014-11-23 VITALS — BP 118/78 | HR 80 | Temp 98.4°F

## 2014-11-23 DIAGNOSIS — C49A0 Gastrointestinal stromal tumor, unspecified site: Secondary | ICD-10-CM

## 2014-11-23 DIAGNOSIS — Z23 Encounter for immunization: Secondary | ICD-10-CM | POA: Diagnosis present

## 2014-11-23 LAB — CBC WITH DIFFERENTIAL/PLATELET
BASO%: 0.6 % (ref 0.0–2.0)
Basophils Absolute: 0 10*3/uL (ref 0.0–0.1)
EOS%: 3.5 % (ref 0.0–7.0)
Eosinophils Absolute: 0.1 10*3/uL (ref 0.0–0.5)
HCT: 28.5 % — ABNORMAL LOW (ref 34.8–46.6)
HGB: 9 g/dL — ABNORMAL LOW (ref 11.6–15.9)
LYMPH%: 28.9 % (ref 14.0–49.7)
MCH: 32.7 pg (ref 25.1–34.0)
MCHC: 31.6 g/dL (ref 31.5–36.0)
MCV: 103.3 fL — ABNORMAL HIGH (ref 79.5–101.0)
MONO#: 0.2 10*3/uL (ref 0.1–0.9)
MONO%: 6.3 % (ref 0.0–14.0)
NEUT%: 60.7 % (ref 38.4–76.8)
NEUTROS ABS: 1.5 10*3/uL (ref 1.5–6.5)
PLATELETS: 240 10*3/uL (ref 145–400)
RBC: 2.76 10*6/uL — AB (ref 3.70–5.45)
RDW: 16.5 % — ABNORMAL HIGH (ref 11.2–14.5)
WBC: 2.4 10*3/uL — AB (ref 3.9–10.3)
lymph#: 0.7 10*3/uL — ABNORMAL LOW (ref 0.9–3.3)

## 2014-11-23 MED ORDER — INFLUENZA VAC SPLIT QUAD 0.5 ML IM SUSY
0.5000 mL | PREFILLED_SYRINGE | Freq: Once | INTRAMUSCULAR | Status: AC
  Administered 2014-11-23: 0.5 mL via INTRAMUSCULAR
  Filled 2014-11-23: qty 0.5

## 2014-11-23 NOTE — Telephone Encounter (Signed)
-----   Message from Ladell Pier, MD sent at 11/23/2014  2:51 PM EST ----- Please call patient, , neutrophils are stable, f/u as scheduled, call for fever

## 2014-11-23 NOTE — Progress Notes (Signed)
Checked pt in for my appointment. Approved pt for [place]. Pt given copy of approval letter and expenses it may cover. Pt brought [employer] bill,submiited for pymt from grant. Pt has transportation concern. She caught the bus but it will be getting colder and she does not have anyone to bring her. Referred pt to Lauren(CSW) for this concern. Lauren's number outlined on the grant form for her to contact.    Pt has my card for any additional financial questions or concerns.

## 2014-11-23 NOTE — Telephone Encounter (Signed)
Per Dr. Benay Spice; notified pt that neutrophils are stable, f/u as scheduled and call if fever.  Pt verbalized understanding and expressed appreciation.

## 2014-11-29 ENCOUNTER — Encounter: Payer: Self-pay | Admitting: *Deleted

## 2014-11-29 NOTE — Progress Notes (Signed)
[place] Work  Clinical Social Work returned pt's call re transportation concerns. Pt reports [facility] still has not contacted her to inform her if she was approved. CSW left vm for [facility] at [place]. Pt plans to call [facility] and see if she can set up an appt for [facility] for her next appt at [facility]. She reports she met with financial counseling and was approved for the grant as well. Pt reports she is doing ok right now and denied other needs.  Pt will reach out if she was NOT approved for [facility].   Clinical Social Work interventions: Resource coordination Loren Racer, [place] Worker [place]  [place] Phone: [phone] Fax: [phone]

## 2014-12-12 ENCOUNTER — Ambulatory Visit (HOSPITAL_BASED_OUTPATIENT_CLINIC_OR_DEPARTMENT_OTHER): Payer: Medicaid Other | Admitting: Physician Assistant

## 2014-12-12 ENCOUNTER — Encounter: Payer: Self-pay | Admitting: Physician Assistant

## 2014-12-12 ENCOUNTER — Other Ambulatory Visit: Payer: Self-pay | Admitting: *Deleted

## 2014-12-12 ENCOUNTER — Other Ambulatory Visit (HOSPITAL_BASED_OUTPATIENT_CLINIC_OR_DEPARTMENT_OTHER): Payer: Medicaid Other

## 2014-12-12 VITALS — BP 128/87 | HR 81 | Temp 98.6°F | Resp 18 | Ht 68.0 in | Wt 166.9 lb

## 2014-12-12 DIAGNOSIS — C49A2 Gastrointestinal stromal tumor of stomach: Secondary | ICD-10-CM | POA: Diagnosis not present

## 2014-12-12 DIAGNOSIS — C49A0 Gastrointestinal stromal tumor, unspecified site: Secondary | ICD-10-CM

## 2014-12-12 DIAGNOSIS — K3189 Other diseases of stomach and duodenum: Secondary | ICD-10-CM

## 2014-12-12 DIAGNOSIS — R634 Abnormal weight loss: Secondary | ICD-10-CM

## 2014-12-12 LAB — COMPREHENSIVE METABOLIC PANEL (CC13)
ALT: 11 U/L (ref 0–55)
ANION GAP: 9 meq/L (ref 3–11)
AST: 17 U/L (ref 5–34)
Albumin: 3.1 g/dL — ABNORMAL LOW (ref 3.5–5.0)
Alkaline Phosphatase: 85 U/L (ref 40–150)
BUN: 17.7 mg/dL (ref 7.0–26.0)
CALCIUM: 9.7 mg/dL (ref 8.4–10.4)
CHLORIDE: 108 meq/L (ref 98–109)
CO2: 22 mEq/L (ref 22–29)
CREATININE: 1.5 mg/dL — AB (ref 0.6–1.1)
EGFR: 47 mL/min/{1.73_m2} — AB (ref >90)
Glucose: 85 mg/dl (ref 70–140)
POTASSIUM: 3.9 meq/L (ref 3.5–5.1)
Sodium: 139 mEq/L (ref 136–145)
Total Bilirubin: 0.52 mg/dL (ref 0.20–1.20)
Total Protein: 7.4 g/dL (ref 6.4–8.3)

## 2014-12-12 LAB — CBC WITH DIFFERENTIAL/PLATELET
BASO%: 0.4 % (ref 0.0–2.0)
Basophils Absolute: 0 10*3/uL (ref 0.0–0.1)
EOS%: 1.3 % (ref 0.0–7.0)
Eosinophils Absolute: 0 10*3/uL (ref 0.0–0.5)
HEMATOCRIT: 29.7 % — AB (ref 34.8–46.6)
HGB: 9.4 g/dL — ABNORMAL LOW (ref 11.6–15.9)
LYMPH%: 24.9 % (ref 14.0–49.7)
MCH: 32 pg (ref 25.1–34.0)
MCHC: 31.6 g/dL (ref 31.5–36.0)
MCV: 101.4 fL — AB (ref 79.5–101.0)
MONO#: 0.2 10*3/uL (ref 0.1–0.9)
MONO%: 5.6 % (ref 0.0–14.0)
NEUT#: 2.1 10*3/uL (ref 1.5–6.5)
NEUT%: 67.8 % (ref 38.4–76.8)
PLATELETS: 322 10*3/uL (ref 145–400)
RBC: 2.93 10*6/uL — ABNORMAL LOW (ref 3.70–5.45)
RDW: 16.3 % — ABNORMAL HIGH (ref 11.2–14.5)
WBC: 3.1 10*3/uL — AB (ref 3.9–10.3)
lymph#: 0.8 10*3/uL — ABNORMAL LOW (ref 0.9–3.3)

## 2014-12-12 MED ORDER — MEGESTROL ACETATE 40 MG/ML PO SUSP: 200.0000 mg | Freq: Two times a day (BID) | ORAL | Status: DC

## 2014-12-12 MED ORDER — SUNITINIB MALATE 25 MG PO CAPS: 25.0000 mg | ORAL_CAPSULE | Freq: Every day | ORAL | Status: DC

## 2014-12-12 NOTE — Progress Notes (Signed)
[place] OFFICE PROGRESS NOTE   Diagnosis: Gastrointestinal stromal tumor  INTERVAL HISTORY:   MonicaValdez returns as scheduled. She continues Sutent. She is tolerating the medicine well without any mouth sores or thrush. She takes approximately 2 hydrocodone tablet per day for relief of right abdominal pain, with bowel support when she experiences constipation. Her appetite is still somewhat decreased, for which she takes Megace which helps with the symptoms. She denies any nausea or vomiting. She denies any respiratory or cardiac complaints. She denies any bleeding issues. She ambulates without difficulty.  Objective:  Vital signs in last 24 hours:  Blood pressure 128/87, pulse 81, temperature 98.6 F (37 C), temperature source Oral, resp. rate 18, height 5\' 8"  (1.727 m), weight 166 lb 14.4 oz (75.705 kg), SpO2 99 %.    HEENT: No thrush or ulcers. Resp: Decreased breath sounds at the right posterior base, no respiratory distress Cardio: Regular rate and rhythm GI: No hepatomegaly, no apparent ascites, mildly distended, firmness in the right mid abdomen is much improved  Vascular: No leg edema   Lab Results:  Lab Results  Component Value Date   WBC 3.1* 12/12/2014   HGB 9.4* 12/12/2014   HCT 29.7* 12/12/2014   MCV 101.4* 12/12/2014   PLT 322 12/12/2014   NEUTROABS 2.1 12/12/2014    CMP Latest Ref Rng 12/12/2014 11/09/2014 10/10/2014  Glucose 70 - 140 mg/dl 85 74 85  BUN 7.0 - 26.0 mg/dL 17.7 16.4 15.2  Creatinine 0.6 - 1.1 mg/dL 1.5(H) 1.4(H) 1.5(H)  Sodium 136 - 145 mEq/L 139 142 141  Potassium 3.5 - 5.1 mEq/L 3.9 3.9 3.6  Chloride 96 - 112 mEq/L - - -  CO2 22 - 29 mEq/L 22 26 23   Calcium 8.4 - 10.4 mg/dL 9.7 9.2 9.0  Total Protein 6.4 - 8.3 g/dL 7.4 6.8 6.5  Total Bilirubin 0.20 - 1.20 mg/dL 0.52 0.50 0.51  Alkaline Phos 40 - 150 U/L 85 74 69  AST 5 - 34 U/L 17 17 17   ALT 0 - 55 U/L 11 10 10      Medications: I have reviewed the patient's  current medications.  Assessment/Plan: 1. Gastrointestinal stromal tumor of the stomach, high grade, stage IIIB (T4 NX), ruptured tumor, status post partial gastrectomy 09/07/2012. She began adjuvant Gleevec 10/19/2012 with a three-year course planned.  She discontinued Gleevec 06/20/2013  CT 01/09/2014 with numerous soft tissue masses filling the abdomen and pelvis consistent with recurrent tumor. Soft tissue masses caused significant narrowing of the gastric lumen. Small and large bowel loops appeared narrowed/compressed but did not appear to be obstructed. Findings suspicious for right hilar lymphadenopathy or tumor nodularity.   Biopsy of an abdominal mass 01/11/2014 showed sheets of spindle cells with myxoid differentiation. The morphology was similar to the patient's previous gastrointestinal stromal tumor.  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  Gleevec dose increased to 400 mg daily 05/15/2014  CT scan 06/29/2014 with progressive large abdominal masses with significant mass effect on the solid abdominal organs and bowel. No bowel obstruction.  Gleevec discontinued and sunitinib initiated 37.5 mg daily following office visit 06/15/2014.  Sunitinib placed on hold 07/18/2014 due to mouth soreness and skin rash.  Sunitinib resumed at a dose of 25 mg daily on 07/31/2014  CT abdomen/pelvis 10/10/2014 showed redemonstration of massive  low-density abdominal/peritoneal masses. Dominant right-sided mass slightly decreased in size. Left-sided dominant mass similar.  Sunitinib continued 25 mg daily. 2. Multiple sclerosis followed by Dr. Tomi Likens 3. History of anemia secondary to blood loss from #1. Persistent severe anemia. Stool negative for occult blood  05/30/2014. She was transfused 2 units of blood on 06/02/2014. Stable. 4. Renal dysfunction. Improved. 5. History of hypokalemia-now taking a potassium supplement 6. Pain secondary to the large right abdominal mass with displacement of the right kidney 7. Weight loss.  8. Lower extremity edema. Question related to [place]. Improved.    Disposition:  She appears stable. She will continue [employer]. In addition, she requested that refill for Megace through the [facility], and this is to be called as well. She will contact us for a fever.  Ms. Khosla will be scheduled for an office visit in 1 month with CT scans to evaluate the response to treatment, or sooner if necessary. Dr. Blair Promise has seen and evaluated the patient. This is a shared visit  Rondel Jumbo, MD  12/12/2014  2:50 PM   This was a shared visit with Sharene Butters. Ms. Teska was interviewed and examined.  He appears stable. The plan is to continue Sutent. She will undergo a restaging CT prior to an office visit in one month.  Julieanne Manson, M.D.

## 2014-12-18 ENCOUNTER — Telehealth: Payer: Self-pay | Admitting: Oncology

## 2014-12-18 NOTE — Telephone Encounter (Signed)
lvm for pt regarding to [facility] appt...the patient ok and aware

## 2014-12-27 ENCOUNTER — Ambulatory Visit: Payer: Medicaid Other | Admitting: Neurology

## 2015-01-03 ENCOUNTER — Other Ambulatory Visit: Payer: Self-pay | Admitting: *Deleted

## 2015-01-03 DIAGNOSIS — R634 Abnormal weight loss: Secondary | ICD-10-CM

## 2015-01-03 MED ORDER — MEGESTROL ACETATE 40 MG/ML PO SUSP: 200.0000 mg | Freq: Two times a day (BID) | ORAL | Status: DC

## 2015-01-10 ENCOUNTER — Ambulatory Visit: Payer: Medicaid Other | Admitting: Oncology

## 2015-01-10 ENCOUNTER — Ambulatory Visit (HOSPITAL_COMMUNITY): Payer: Medicaid Other

## 2015-01-10 ENCOUNTER — Telehealth (HOSPITAL_COMMUNITY): Payer: Self-pay | Admitting: Physician Assistant

## 2015-01-11 ENCOUNTER — Telehealth: Payer: Self-pay | Admitting: *Deleted

## 2015-01-11 ENCOUNTER — Other Ambulatory Visit: Payer: Self-pay | Admitting: *Deleted

## 2015-01-11 NOTE — Telephone Encounter (Signed)
Needs to stay on sunitinib until office appointment and we will then decide on refill.  We can refill sooner if she will run out prior to next week

## 2015-01-11 NOTE — Telephone Encounter (Signed)
TC from pt inquiring about her next appt with Dr. Benay Spice. She is scheduled for a CT scan on 01/16/14 and wants to know if she will be seeing Dr. Benay Spice the same day or not.  Also she wants to know that since "my tumors are shrinking, why is my stomach still big?"  Lastly pt wants to get her Sunitinib @ [facility] as they deliver. Unclear if that pharmacy carries this.

## 2015-01-16 ENCOUNTER — Telehealth: Payer: Self-pay | Admitting: *Deleted

## 2015-01-16 NOTE — Telephone Encounter (Signed)
Pt left voice message requesting refill of Sutent be sent to [facility] "because they will deliver and I can't be taking the bus in this cold weather to get my medicine"  Per Dr. Benay Spice; left voice message on home# that Dr. Benay Spice wants to wait to refill Sutent until after CT on 1/4 and will discuss result @ 1/5 appt.  Call office if any questions.

## 2015-01-17 ENCOUNTER — Ambulatory Visit (HOSPITAL_COMMUNITY)
Admission: RE | Admit: 2015-01-17 | Discharge: 2015-01-17 | Disposition: A | Discharge status: Home / Self Care | Payer: Medicaid Other | Source: Ambulatory Visit | Attending: Physician Assistant | Admitting: Physician Assistant

## 2015-01-17 DIAGNOSIS — J9811 Atelectasis: Secondary | ICD-10-CM | POA: Diagnosis not present

## 2015-01-17 DIAGNOSIS — C49A0 Gastrointestinal stromal tumor, unspecified site: Secondary | ICD-10-CM | POA: Insufficient documentation

## 2015-01-17 DIAGNOSIS — R222 Localized swelling, mass and lump, trunk: Secondary | ICD-10-CM | POA: Diagnosis not present

## 2015-01-17 DIAGNOSIS — K3189 Other diseases of stomach and duodenum: Secondary | ICD-10-CM

## 2015-01-17 DIAGNOSIS — R188 Other ascites: Secondary | ICD-10-CM | POA: Diagnosis not present

## 2015-01-18 ENCOUNTER — Other Ambulatory Visit: Payer: Medicaid Other

## 2015-01-18 ENCOUNTER — Ambulatory Visit (HOSPITAL_BASED_OUTPATIENT_CLINIC_OR_DEPARTMENT_OTHER): Payer: Medicaid Other | Admitting: Nurse Practitioner

## 2015-01-18 ENCOUNTER — Encounter: Payer: Self-pay | Admitting: Nurse Practitioner

## 2015-01-18 VITALS — BP 139/81 | HR 82 | Temp 98.3°F | Resp 18 | Ht 68.0 in | Wt 170.6 lb

## 2015-01-18 DIAGNOSIS — E876 Hypokalemia: Secondary | ICD-10-CM

## 2015-01-18 DIAGNOSIS — C49A2 Gastrointestinal stromal tumor of stomach: Secondary | ICD-10-CM

## 2015-01-18 DIAGNOSIS — N289 Disorder of kidney and ureter, unspecified: Secondary | ICD-10-CM | POA: Diagnosis not present

## 2015-01-18 DIAGNOSIS — D500 Iron deficiency anemia secondary to blood loss (chronic): Secondary | ICD-10-CM

## 2015-01-18 NOTE — Progress Notes (Addendum)
[facility] OFFICE PROGRESS NOTE   Diagnosis:  Gastrointestinal stromal tumor  INTERVAL HISTORY:   Monica [name] returns for follow-up. She continues Sutent. She denies nausea/vomiting. No rash. No hand or foot pain or redness. She has occasional loose stools. She has periodic pain at the right upper abdomen. She estimates taking one Vicodin tablet a day. Over the past month she has noted early satiety.  Objective:  Vital signs in last 24 hours:  Blood pressure 139/81, pulse 82, temperature 98.3 F (36.8 C), temperature source Oral, resp. rate 18, height 5\' 8"  (1.727 m), weight 170 lb 9.6 oz (77.384 kg), SpO2 100 %.    HEENT: No thrush or ulcers. Resp: Lungs clear bilaterally. Breath sounds are diminished at the right base. No respiratory distress. Cardio: Regular rate and rhythm. GI: Abdomen is distended. No apparent ascites. Firmness superior to the umbilicus. Fullness throughout the upper abdomen. Vascular: No leg edema. Skin: No rash.    Lab Results:  Lab Results  Component Value Date   WBC 3.1* 12/12/2014   HGB 9.4* 12/12/2014   HCT 29.7* 12/12/2014   MCV 101.4* 12/12/2014   PLT 322 12/12/2014   NEUTROABS 2.1 12/12/2014    Imaging:  Ct Abdomen Pelvis Wo Contrast  01/17/2015  CLINICAL DATA:  Malignant GIST, on oral chemotherapy, right abdominal pain EXAM: CT ABDOMEN AND PELVIS WITHOUT CONTRAST TECHNIQUE: Multidetector CT imaging of the abdomen and pelvis was performed following the standard protocol without IV contrast. COMPARISON:  10/10/2014 FINDINGS: Lower chest: Platelike atelectasis at in the right lower lobe. Linear scarring/ atelectasis in the left lower lobe. Hepatobiliary: Unenhanced liver is grossly unremarkable. Scalloping/mass effect on the right hepatic lobe by the dominant right abdominal mass. Gallbladder is unremarkable. No intrahepatic or extrahepatic ductal dilatation. Pancreas: Poorly visualized but grossly unremarkable. Spleen: Displaced  posteriorly by the dominant left abdominal mass but grossly unremarkable. Adrenals/Urinary Tract: Left adrenal gland is within normal limits. Right adrenal gland is poorly visualized/displaced by the dominant mass. Left kidney is grossly unremarkable. 9 mm probable cyst in the lateral left lower pole (series 3/ image 43). No renal calculi or hydronephrosis. Right kidney is displaced medially/inferiorly with multiple simple cysts measuring up to 3.1 cm (series 3/image 83). No hydronephrosis. Bladder is underdistended. Stomach/Bowel: Status post partial gastrectomy with gastrojejunostomy, patent. No evidence of bowel obstruction. Vascular/Lymphatic: No evidence of abdominal aortic aneurysm. Evaluation of abdominopelvic lymphadenopathy is limited due to lack of intravenous contrast, paucity of intra-abdominal fat, and mass effect by multiple space-occupying lesions. Reproductive: Uterus is notable for bilateral Essure implants in satisfactory position. Bilateral ovaries are grossly unremarkable. Other: Dominant mass in the right upper/mid abdomen measures 15.6 x 20.3 cm in maximal axial dimensions (series 3/ image 42), previously 14.2 x 19.3 cm when measured in a similar fashion, increased. Prior hemorrhage medially within the mass has resolved. Dominant mass in the left upper abdomen measures 14.3 x 10.7 cm in maximal axial dimensions (series 3/ image 23), previously 14.3 x 10.2 cm when measured in a similar fashion, possibly mildly increased. Omental caking of beneath the left mid abdominal wall now measures 8.4 x 21.1 cm in aggregate (series 3/ image 57), previously 7.5 x 16.3 cm, increased. Additionally, this now extends into the anterior left pelvis measuring approximately 9.3 x 13.7 cm (series 3/ image 34), new. 5.2 x 6.6 cm peritoneal implant in the right lower quadrant (series 3/image 82), difficult to discretely measure but likely measuring 5.2 x 5.2 cm on the prior study, increased. Multiple  additional  scattered omental/peritoneal nodules in the abdomen/pelvis (for example, series 3/image 41), although poorly visualized. Small to moderate complex pelvic fluid/ascites. Musculoskeletal: Mild degenerative changes of the visualized thoracolumbar spine. IMPRESSION: Dominant 20.3 cm mass in the right upper abdomen is mildly increased. Dominant 14.3 cm mass in the left upper abdomen is stable versus mildly increased. Progression of omental caking/peritoneal disease in the abdomen/pelvis, as above. Small to moderate complex pelvic fluid/ascites. Electronically Signed   By: Julian Hy M.D.   On: 01/17/2015 15:41    Medications: I have reviewed the patient's current medications.  Assessment/Plan: 1. Gastrointestinal stromal tumor of the stomach, high grade, stage IIIB (T4 NX), ruptured tumor, status post partial gastrectomy 09/07/2012. She began adjuvant Gleevec 10/19/2012 with a three-year course planned.  She discontinued Gleevec 06/20/2013  CT 01/09/2014 with numerous soft tissue masses filling the abdomen and pelvis consistent with recurrent tumor. Soft tissue masses caused significant narrowing of the gastric lumen. Small and large bowel loops appeared narrowed/compressed but did not appear to be obstructed. Findings suspicious for right hilar lymphadenopathy or tumor nodularity.   Biopsy of an abdominal mass 01/11/2014 showed sheets of spindle cells with myxoid differentiation. The morphology was similar to the patient's previous gastrointestinal stromal tumor.  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  Gleevec dose increased to 400 mg daily 05/15/2014  CT scan 06/29/2014 with progressive large abdominal masses with significant mass effect on the solid abdominal  organs and bowel. No bowel obstruction.  Gleevec discontinued and sunitinib initiated 37.5 mg daily following office visit 06/15/2014.  Sunitinib placed on hold 07/18/2014 due to mouth soreness and skin rash.  Sunitinib resumed at a dose of 25 mg daily on 07/31/2014  CT abdomen/pelvis 10/10/2014 showed redemonstration of massive low-density abdominal/peritoneal masses. Dominant right-sided mass slightly decreased in size. Left-sided dominant mass similar.  Sunitinib continued 25 mg daily.  Restaging CT abdomen/pelvis 01/17/2015 with progression.  Initiation of regorafenib (planned start date 01/22/2015) 2. Multiple sclerosis followed by Dr. Tomi Likens 3. History of anemia secondary to blood loss from #1. Persistent severe anemia. Stool negative for occult blood 05/30/2014. She was transfused 2 units of blood on 06/02/2014. Stable. 4. Renal dysfunction. Improved. 5. History of hypokalemia-now taking a potassium supplement 6. Pain secondary to the large right abdominal mass with displacement of the right kidney 7. Weight loss.  8. Lower extremity edema. Question related to [place]. Improved.   Disposition: Monica [name] has evidence of progression on the restaging CT scan. We reviewed the results with her at today's visit. The plan is to discontinue sunitinib and begin regorafenib. We reviewed potential toxicities associated with regorafenib including rash, mucositis, diarrhea, myelosuppression, hypertension, bowel perforation, bleeding, blood clots. She is agreeable to proceed. She will begin regorafenib on 01/22/2015. She will return for a follow-up visit and labs 02/05/2015. She will contact the office in the interim with any problems.  Patient seen with Dr. Benay Spice. 25 minutes were spent face-to-face at today's visit with the majority of that time involved in counseling/coordination of care.    Ned Card ANP/GNP-BC   01/18/2015  4:10 PM  This was a shared visit with Marcello Moores. We  discussed the CT findings with Monica [name]. There is x-ray evidence of disease progression. We decided to discontinue sunitinib and begin a trial of regorafrenib. We reviewed the  potential toxicities associated with regorafenib and she agrees to proceed.   Julieanne Manson, M.D.

## 2015-01-19 ENCOUNTER — Telehealth: Payer: Self-pay | Admitting: Oncology

## 2015-01-19 ENCOUNTER — Telehealth: Payer: Self-pay | Admitting: Hematology

## 2015-01-19 NOTE — Telephone Encounter (Signed)
"  I received call about apointment with Dr. Gearldine Shown office and need to know what time."  Advised 02-05-2015 appointment for lab 1:15 pm.  APP appointment follows at 1:45 pm.  No furtyher questions.

## 2015-01-19 NOTE — Telephone Encounter (Signed)
Spoke with patient re lab/LT 1/23

## 2015-01-19 NOTE — Telephone Encounter (Signed)
Gave patient avs report and appointments for January and February including PT/rehab appointment for 1/11. Per 1/6 pof continue chemo as scheduled for wk 23 and 24 (1/6 and 1/13) then switch to maintenance therapy. No information or care plan within pof at this time.

## 2015-01-22 ENCOUNTER — Telehealth: Payer: Self-pay | Admitting: Pharmacist

## 2015-01-22 ENCOUNTER — Other Ambulatory Visit: Payer: Self-pay | Admitting: Oncology

## 2015-01-22 ENCOUNTER — Telehealth: Payer: Self-pay | Admitting: *Deleted

## 2015-01-22 ENCOUNTER — Encounter: Payer: Self-pay | Admitting: Pharmacist

## 2015-01-22 DIAGNOSIS — C49A0 Gastrointestinal stromal tumor, unspecified site: Secondary | ICD-10-CM

## 2015-01-22 MED ORDER — REGORAFENIB 40 MG PO TABS
160.0000 mg | ORAL_TABLET | Freq: Every day | ORAL | Status: DC
Start: 2015-01-23 — End: 2015-02-14

## 2015-01-22 MED FILL — STIVARGA 40 MG TABLET: 40 | 21 days supply | Qty: 84 | Fill #0

## 2015-01-22 NOTE — Progress Notes (Signed)
Oral Chemotherapy Pharmacist Encounter   I spoke with patient for overview of new oral chemotherapy medication: Stivarga. Pt is doing well. The prescriptions have been sent to the [facility] and patient picked up medication today (pt has Medicaid). Pt will start tomorrow 01/23/15  Counseled patient on administration, dosing, side effects, safe handling, and monitoring. Side effects include but not limited to: Fatigue, nausea, diarrhea, fever, abdominal pain, mucositis, and hypertension.  Monica [name] voiced understanding and appreciation. She did not realize she would be taking 4 tablets daily for 21 days. She assumed she would be just taking 1 tablet. Counseled Monica [name] that she will be taking 4 tablets every morning and to take this with food that is low in fat (i.e. Wheat cereal that she normal eats). Monica [name] voiced understanding and repeating back instructions and plan. She will take this for 21 days and then off for 7 days. Repeating every 28 days.   All questions answered.  Will follow up in 1 week for adherence and toxicity management.   Thank you,  Montel Clock, PharmD, [facility]

## 2015-01-22 NOTE — Telephone Encounter (Signed)
Call received from patient "asking Dr. Benay Spice to call her.  Would like to know exactly how many tumors were seen with the CT scan.  At the pharmacy and would like to know how to take the [place]."    This nurse advised she take four tablets with a no fat breakfast for twenty-one days, rest seven days and repeat.  "Four pills a day.  That's not what I was told, are you sure?  None of those instructions are on this bottle.  He needs to call me because I have questions about these pills too."  Advised that is the way this medicine is ordered for 160 mg daily.

## 2015-01-22 NOTE — Telephone Encounter (Signed)
01/22/2015: New Rx for Stivarga sent to [facility]

## 2015-01-25 NOTE — Telephone Encounter (Signed)
Oral Chemotherapy Pharmacist has called patient to clarify orders and instruct.

## 2015-01-30 ENCOUNTER — Telehealth: Payer: Self-pay | Admitting: *Deleted

## 2015-01-30 ENCOUNTER — Other Ambulatory Visit: Payer: Self-pay | Admitting: *Deleted

## 2015-01-30 MED ORDER — HYDROCODONE-ACETAMINOPHEN 5-325 MG PO TABS: 1.0000 | ORAL_TABLET | ORAL | Status: DC | PRN

## 2015-01-30 NOTE — Telephone Encounter (Signed)
Per Request; notified pt that Vicodin script is ready for p/u.  Pt verbalized understanding.

## 2015-01-30 NOTE — Telephone Encounter (Signed)
Pt states she needs a refill of her vicodin. Needs it by 12 today as she has arranged [facility] to bring her to pick up rx. Please call when ready

## 2015-01-31 ENCOUNTER — Encounter: Payer: Self-pay | Admitting: Pharmacist

## 2015-01-31 NOTE — Progress Notes (Signed)
Oral Chemotherapy Follow-Up Form  Original Start date of oral chemotherapy: _1/10/17_   Called patient today to follow up regarding patient's oral chemotherapy medication: _Stivarga_  Pt is doing well today. No missed doses. No side effects to report. Ms. Hook states her pain had increased the other day but she is doing better now. She has pain medication at home and has requested a refill that has been sent in. No issues with Stivarga. Patient feels that it is helping and she no long feels "full"  Pt reports _0_ tablets/doses missed in the last week/month.   Pt reports the following side effects: _none_____  Other Issues: _some pain____   Will follow up and call patient again in __2 weeks___   Thank you,  Montel Clock, PharmD, [facility]

## 2015-02-01 MED FILL — HYDROCODON-APAP 5-325: 5-325 | 6 days supply | Qty: 75 | Fill #0

## 2015-02-05 ENCOUNTER — Ambulatory Visit (HOSPITAL_BASED_OUTPATIENT_CLINIC_OR_DEPARTMENT_OTHER): Payer: Medicaid Other | Admitting: Nurse Practitioner

## 2015-02-05 ENCOUNTER — Other Ambulatory Visit (HOSPITAL_BASED_OUTPATIENT_CLINIC_OR_DEPARTMENT_OTHER): Payer: Medicaid Other

## 2015-02-05 VITALS — BP 130/85 | HR 85 | Temp 98.6°F | Resp 18 | Ht 68.0 in | Wt 166.4 lb

## 2015-02-05 DIAGNOSIS — D500 Iron deficiency anemia secondary to blood loss (chronic): Secondary | ICD-10-CM

## 2015-02-05 DIAGNOSIS — C49A2 Gastrointestinal stromal tumor of stomach: Secondary | ICD-10-CM

## 2015-02-05 LAB — CBC WITH DIFFERENTIAL/PLATELET
BASO%: 0.3 % (ref 0.0–2.0)
BASOS ABS: 0 10*3/uL (ref 0.0–0.1)
EOS ABS: 0 10*3/uL (ref 0.0–0.5)
EOS%: 0.6 % (ref 0.0–7.0)
HCT: 28.3 % — ABNORMAL LOW (ref 34.8–46.6)
HGB: 8.9 g/dL — ABNORMAL LOW (ref 11.6–15.9)
LYMPH%: 15.4 % (ref 14.0–49.7)
MCH: 31.6 pg (ref 25.1–34.0)
MCHC: 31.4 g/dL — AB (ref 31.5–36.0)
MCV: 100.4 fL (ref 79.5–101.0)
MONO#: 0.8 10*3/uL (ref 0.1–0.9)
MONO%: 11.1 % (ref 0.0–14.0)
NEUT#: 5.2 10*3/uL (ref 1.5–6.5)
NEUT%: 72.6 % (ref 38.4–76.8)
PLATELETS: 457 10*3/uL — AB (ref 145–400)
RBC: 2.82 10*6/uL — AB (ref 3.70–5.45)
RDW: 16.6 % — ABNORMAL HIGH (ref 11.2–14.5)
WBC: 7.2 10*3/uL (ref 3.9–10.3)
lymph#: 1.1 10*3/uL (ref 0.9–3.3)

## 2015-02-05 LAB — COMPREHENSIVE METABOLIC PANEL
ALT: 10 U/L (ref 0–55)
ANION GAP: 9 meq/L (ref 3–11)
AST: 16 U/L (ref 5–34)
Albumin: 2.7 g/dL — ABNORMAL LOW (ref 3.5–5.0)
Alkaline Phosphatase: 163 U/L — ABNORMAL HIGH (ref 40–150)
BUN: 12.3 mg/dL (ref 7.0–26.0)
CHLORIDE: 106 meq/L (ref 98–109)
CO2: 22 meq/L (ref 22–29)
Calcium: 9.2 mg/dL (ref 8.4–10.4)
Creatinine: 1.3 mg/dL — ABNORMAL HIGH (ref 0.6–1.1)
EGFR: 58 mL/min/{1.73_m2} — AB (ref >90)
Glucose: 90 mg/dl (ref 70–140)
POTASSIUM: 3.9 meq/L (ref 3.5–5.1)
Sodium: 137 mEq/L (ref 136–145)
Total Bilirubin: 0.65 mg/dL (ref 0.20–1.20)
Total Protein: 7.4 g/dL (ref 6.4–8.3)

## 2015-02-05 NOTE — Progress Notes (Addendum)
[place] OFFICE PROGRESS NOTE   Diagnosis:  Gastrointestinal stromal tumor  INTERVAL HISTORY:   Monica [name] returns as scheduled. She began regorafenib 01/23/2015. She denies nausea/vomiting. No mouth sores. No diarrhea. No rash. No hand or foot pain or redness. Main complaint is fatigue. She noted increased abdominal pain for the first week she took regorafenib. The pain has improved. She is taking 2-4 Vicodin tablets a day.  Objective:  Vital signs in last 24 hours:  Blood pressure 130/85, pulse 85, temperature 98.6 F (37 C), temperature source Oral, resp. rate 18, height 5\' 8"  (1.727 m), weight 166 lb 6.4 oz (75.479 kg), SpO2 99 %.    HEENT: No thrush or ulcers. Resp: Lungs clear bilaterally. Breath sounds diminished at the right base. No respiratory distress. Cardio: Regular rate and rhythm. GI: Abdomen distended. No apparent ascites. Fullness throughout the right upper abdomen. Vascular: No leg edema. Skin: Palms without erythema.    Lab Results:  Lab Results  Component Value Date   WBC 7.2 02/05/2015   HGB 8.9* 02/05/2015   HCT 28.3* 02/05/2015   MCV 100.4 02/05/2015   PLT 457* 02/05/2015   NEUTROABS 5.2 02/05/2015    Imaging:  No results found.  Medications: I have reviewed the patient's current medications.  Assessment/Plan: 1. Gastrointestinal stromal tumor of the stomach, high grade, stage IIIB (T4 NX), ruptured tumor, status post partial gastrectomy 09/07/2012. She began adjuvant Gleevec 10/19/2012 with a three-year course planned.  She discontinued Gleevec 06/20/2013  CT 01/09/2014 with numerous soft tissue masses filling the abdomen and pelvis consistent with recurrent tumor. Soft tissue masses caused significant narrowing of the gastric lumen. Small and large bowel loops appeared narrowed/compressed but did not appear to be obstructed. Findings suspicious for right hilar lymphadenopathy or tumor nodularity.   Biopsy of an abdominal  mass 01/11/2014 showed sheets of spindle cells with myxoid differentiation. The morphology was similar to the patient's previous gastrointestinal stromal tumor.  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  Gleevec dose increased to 400 mg daily 05/15/2014  CT scan 06/29/2014 with progressive large abdominal masses with significant mass effect on the solid abdominal organs and bowel. No bowel obstruction.  Gleevec discontinued and sunitinib initiated 37.5 mg daily following office visit 06/15/2014.  Sunitinib placed on hold 07/18/2014 due to mouth soreness and skin rash.  Sunitinib resumed at a dose of 25 mg daily on 07/31/2014  CT abdomen/pelvis 10/10/2014 showed redemonstration of massive low-density abdominal/peritoneal masses. Dominant right-sided mass slightly decreased in size. Left-sided dominant mass similar.  Sunitinib continued 25 mg daily.  Restaging CT abdomen/pelvis 01/17/2015 with progression.  Initiation of regorafenib (01/23/2015) 2. Multiple sclerosis followed by Dr. Tomi Likens 3. History of anemia secondary to blood loss from #1. Persistent severe anemia. Stool negative for occult blood 05/30/2014. She was transfused 2 units of blood on 06/02/2014. Stable. 4. Renal dysfunction. Improved. 5. History of hypokalemia-now taking a potassium supplement 6. Pain secondary to the large right abdominal mass with displacement of the right kidney 7. Weight loss.  8. Lower extremity edema. Question related to [place]. Improved.   Disposition: Monica [name] began cycle 1 regorafenib on 01/23/2015. Thus far she appears to be tolerating it well. She will continue for a total of 21 days followed by a 7 day break. We will see her  in follow-up on 02/20/2015. She will  contact the office in the interim with any problems.  Patient seen with Dr. Benay Spice. CT images reviewed on the computer with Monica [name]. 25 minutes were spent face-to-face at today's visit with the majority of that time involving counseling/coordination of care.    Ned Card ANP/GNP-BC   02/05/2015  2:07 PM  This was a shared visit with Ned Card. Monica [name] appears to be tolerating the rapid and well. We reviewed the restaging CT images with her today.  Monica [name] will return for an office and lab visit prior to the next cycle of regorafenib  Julieanne Manson, M.D.

## 2015-02-06 ENCOUNTER — Telehealth: Payer: Self-pay | Admitting: Nurse Practitioner

## 2015-02-06 NOTE — Telephone Encounter (Signed)
Lft msg for pt confirming labs/ov per 01/23 POF, mailed out schedule to pt... KJ

## 2015-02-13 ENCOUNTER — Telehealth: Payer: Self-pay | Admitting: *Deleted

## 2015-02-13 NOTE — Telephone Encounter (Signed)
Message from pt requesting a stronger pain med. Returned call, she reports R upper quadrant and low abdominal pain. Vicodin works for a couple hours. Has to take 2 every 4 hours. Pt does not want to come in to office sooner than next visit. She is requesting a stronger prescription when she comes in. Instructed her to call office sooner than next appointment if needed. She voiced understanding.

## 2015-02-14 ENCOUNTER — Telehealth: Payer: Self-pay | Admitting: *Deleted

## 2015-02-14 DIAGNOSIS — C49A2 Gastrointestinal stromal tumor of stomach: Secondary | ICD-10-CM

## 2015-02-14 MED ORDER — OXYCODONE HCL 5 MG PO TABS: 5.0000 mg | ORAL_TABLET | ORAL | Status: DC | PRN

## 2015-02-14 MED ORDER — REGORAFENIB 40 MG PO TABS: 160.0000 mg | ORAL_TABLET | Freq: Every day | ORAL | Status: DC

## 2015-02-14 MED FILL — STIVARGA 40 MG TABLET: 40 | 28 days supply | Qty: 84 | Fill #0

## 2015-02-14 NOTE — Addendum Note (Signed)
Addended by: Brien Few on: 02/14/2015 11:43 AM   Modules accepted: Orders

## 2015-02-14 NOTE — Telephone Encounter (Signed)
Returned call to pt, Oxycodone instructions reviewed. She voiced understanding, she will pick up new Rx at the office. Chemo Rx refill sent to [facility]. Pt instructed not to take until next visit. She voiced understanding.

## 2015-02-14 NOTE — Telephone Encounter (Addendum)
"  Could you tell the nurse I spoke with yesterday I need something stronger for pain.  I also need the tumor medicine (Stivarga) refill sent in so I do not have to go to the Pharmacy but one time.  I have eight vicodin pills left and do not have [employer] today.  The vicodin is not helping seems like eating candy.  Return number [phone]."

## 2015-02-14 NOTE — Telephone Encounter (Signed)
No note

## 2015-02-15 ENCOUNTER — Telehealth: Payer: Self-pay | Admitting: Oncology

## 2015-02-15 ENCOUNTER — Telehealth: Payer: Self-pay | Admitting: *Deleted

## 2015-02-15 MED FILL — oxyCODONE HCL 5 MG TABS: 5 | 6 days supply | Qty: 75 | Fill #0

## 2015-02-15 NOTE — Telephone Encounter (Signed)
"  I am to restart my medicine February 20, 2015.  Ask Dr Benay Spice if I can switch the Stivarga to night instead of morning.  I fall asleep in church and all day.  I eat light through out the day."   Will notify oral chemotherapy pharmacist and provider of this request.  Emphasized low calorie low fat meal should be eaten before medicine consumed.

## 2015-02-15 NOTE — Telephone Encounter (Signed)
FAXED RECORDS TO [facility] [id number] RELAEASE ID

## 2015-02-15 NOTE — Telephone Encounter (Signed)
02/15/15 - Called Monica [name] and spoke with her about her medication, Stivarga. She is due to start next cycle on 02/20/15. She can change to night time dosing to try and help with her fatigue. She understands to eat a light meal/snack when she takes the medication. Patient sees Dr. Benay Spice on 2/7.   If dosing at night does not benefit her, may need to dose reduce.   Thank you,  Montel Clock, PharmD, [facility]

## 2015-02-20 ENCOUNTER — Ambulatory Visit (HOSPITAL_COMMUNITY)
Admission: RE | Admit: 2015-02-20 | Discharge: 2015-02-20 | Disposition: A | Discharge status: Home / Self Care | Payer: Medicaid Other | Source: Ambulatory Visit | Attending: Oncology | Admitting: Oncology

## 2015-02-20 ENCOUNTER — Ambulatory Visit (HOSPITAL_BASED_OUTPATIENT_CLINIC_OR_DEPARTMENT_OTHER): Payer: Medicaid Other | Admitting: Nurse Practitioner

## 2015-02-20 ENCOUNTER — Telehealth: Payer: Self-pay | Admitting: Nurse Practitioner

## 2015-02-20 ENCOUNTER — Encounter: Payer: Self-pay | Admitting: Nurse Practitioner

## 2015-02-20 ENCOUNTER — Ambulatory Visit: Payer: Medicaid Other

## 2015-02-20 ENCOUNTER — Other Ambulatory Visit (HOSPITAL_BASED_OUTPATIENT_CLINIC_OR_DEPARTMENT_OTHER): Payer: Medicaid Other

## 2015-02-20 VITALS — BP 114/78 | HR 104 | Temp 97.7°F | Resp 18 | Ht 68.0 in | Wt 166.2 lb

## 2015-02-20 DIAGNOSIS — C49A2 Gastrointestinal stromal tumor of stomach: Secondary | ICD-10-CM | POA: Diagnosis present

## 2015-02-20 DIAGNOSIS — D649 Anemia, unspecified: Secondary | ICD-10-CM

## 2015-02-20 DIAGNOSIS — E876 Hypokalemia: Secondary | ICD-10-CM | POA: Diagnosis not present

## 2015-02-20 LAB — COMPREHENSIVE METABOLIC PANEL
ALK PHOS: 110 U/L (ref 40–150)
AST: 14 U/L (ref 5–34)
Albumin: 2.3 g/dL — ABNORMAL LOW (ref 3.5–5.0)
Anion Gap: 11 mEq/L (ref 3–11)
BUN: 12.7 mg/dL (ref 7.0–26.0)
CO2: 18 mEq/L — ABNORMAL LOW (ref 22–29)
Calcium: 9.3 mg/dL (ref 8.4–10.4)
Chloride: 107 mEq/L (ref 98–109)
Creatinine: 1.4 mg/dL — ABNORMAL HIGH (ref 0.6–1.1)
EGFR: 53 mL/min/{1.73_m2} — ABNORMAL LOW (ref >90)
Glucose: 156 mg/dl — ABNORMAL HIGH (ref 70–140)
Potassium: 3.7 mEq/L (ref 3.5–5.1)
SODIUM: 136 meq/L (ref 136–145)
TOTAL PROTEIN: 7 g/dL (ref 6.4–8.3)
Total Bilirubin: 0.61 mg/dL (ref 0.20–1.20)

## 2015-02-20 LAB — CBC WITH DIFFERENTIAL/PLATELET
BASO%: 0.4 % (ref 0.0–2.0)
Basophils Absolute: 0 10*3/uL (ref 0.0–0.1)
EOS ABS: 0 10*3/uL (ref 0.0–0.5)
EOS%: 0.5 % (ref 0.0–7.0)
HCT: 22.1 % — ABNORMAL LOW (ref 34.8–46.6)
HGB: 6.8 g/dL — CL (ref 11.6–15.9)
LYMPH%: 14 % (ref 14.0–49.7)
MCH: 30.2 pg (ref 25.1–34.0)
MCHC: 30.8 g/dL — ABNORMAL LOW (ref 31.5–36.0)
MCV: 98.2 fL (ref 79.5–101.0)
MONO#: 0.6 10*3/uL (ref 0.1–0.9)
MONO%: 6.6 % (ref 0.0–14.0)
NEUT%: 78.5 % — ABNORMAL HIGH (ref 38.4–76.8)
NEUTROS ABS: 6.6 10*3/uL — AB (ref 1.5–6.5)
NRBC: 0 % (ref 0–0)
Platelets: 497 10*3/uL — ABNORMAL HIGH (ref 145–400)
RBC: 2.25 10*6/uL — AB (ref 3.70–5.45)
RDW: 18.3 % — AB (ref 11.2–14.5)
WBC: 8.4 10*3/uL (ref 3.9–10.3)
lymph#: 1.2 10*3/uL (ref 0.9–3.3)

## 2015-02-20 LAB — PREPARE RBC (CROSSMATCH)

## 2015-02-20 NOTE — Telephone Encounter (Signed)
cld & spoke to pt and gaver pt appt time & date for 2/9 blood trans time & location

## 2015-02-20 NOTE — Telephone Encounter (Signed)
per pof to sch pt appt-sch appt-per MW capped 2/9-cld [facility]  and they have high volume of calls at this time -cant sch today-will continue to try and sch and call pt with appt for 2/9-sent pt back to lab per pof

## 2015-02-20 NOTE — Progress Notes (Signed)
[place] OFFICE PROGRESS NOTE   Diagnosis:   Gastrointestinal stromal tumor  INTERVAL HISTORY:   Ms. Laurila returns as scheduled. She completed the first cycle of Regorafenib beginning 01/23/2015. She denies nausea/vomiting. No mouth sores. No diarrhea. No rash. She notes improved pain control since beginning oxycodone. She estimates taking the oxycodone once or twice a day. No change in baseline fatigue. She notes dyspnea on exertion. No chest pain. She denies bleeding. No black stools.  Objective:  Vital signs in last 24 hours:  Blood pressure 114/78, pulse 110, temperature 97.7 F (36.5 C), temperature source Oral, resp. rate 18, height 5\' 8"  (1.727 m), weight 166 lb 3.2 oz (75.388 kg), SpO2 99 %.    HEENT: No thrush or ulcers. Resp: Faint rales at both lung bases. Breath sounds diminished at the right lung base. No respiratory distress. Cardio: Regular rate and rhythm. GI: Abdomen is distended. Palpable mass right mid upper abdomen. Vascular: No leg edema. Neuro: Alert and oriented.  Skin: No rash.    Lab Results:  Lab Results  Component Value Date   WBC 8.4 02/20/2015   HGB 6.8 Repeated and Verified* 02/20/2015   HCT 22.1* 02/20/2015   MCV 98.2 02/20/2015   PLT 497* 02/20/2015   NEUTROABS 6.6* 02/20/2015    Imaging:  No results found.  Medications: I have reviewed the patient's current medications.  Assessment/Plan: 1. Gastrointestinal stromal tumor of the stomach, high grade, stage IIIB (T4 NX), ruptured tumor, status post partial gastrectomy 09/07/2012. She began adjuvant Gleevec 10/19/2012 with a three-year course planned.  She discontinued Gleevec 06/20/2013  CT 01/09/2014 with numerous soft tissue masses filling the abdomen and pelvis consistent with recurrent tumor. Soft tissue masses caused significant narrowing of the gastric lumen. Small and large bowel loops appeared narrowed/compressed but did not appear to be obstructed. Findings  suspicious for right hilar lymphadenopathy or tumor nodularity.   Biopsy of an abdominal mass 01/11/2014 showed sheets of spindle cells with myxoid differentiation. The morphology was similar to the patient's previous gastrointestinal stromal tumor.  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  Gleevec dose increased to 400 mg daily 05/15/2014  CT scan 06/29/2014 with progressive large abdominal masses with significant mass effect on the solid abdominal organs and bowel. No bowel obstruction.  Gleevec discontinued and sunitinib initiated 37.5 mg daily following office visit 06/15/2014.  Sunitinib placed on hold 07/18/2014 due to mouth soreness and skin rash.  Sunitinib resumed at a dose of 25 mg daily on 07/31/2014  CT abdomen/pelvis 10/10/2014 showed redemonstration of massive low-density abdominal/peritoneal masses. Dominant right-sided mass slightly decreased in size. Left-sided dominant mass similar.  Sunitinib continued 25 mg daily.  Restaging CT abdomen/pelvis 01/17/2015 with progression.  Initiation of regorafenib (01/23/2015)  Cycle 2 Regorafenib 02/20/2015 2. Multiple sclerosis followed by Dr. Tomi Likens 3. History of anemia secondary to blood loss from #1. Persistent severe anemia. Stool negative for occult blood 05/30/2014. She was transfused 2 units of blood on 06/02/2014.  4. Renal dysfunction. Improved. 5. History of hypokalemia-now taking a potassium supplement 6. Pain secondary to the large right abdominal mass with displacement of the right kidney 7. Weight loss.  8. Lower extremity edema. Question related to [place]. Improved.    Disposition: Ms. Zemp has completed 1 cycle of Regorafenib. She will proceed with cycle  2 beginning today as  scheduled.  She has progressive anemia. She is symptomatic. We are arranging for a blood transfusion on 02/22/2015. She will complete a set of stool cards.  She will return for a follow-up visit and labs in 2 weeks. She will contact the office in the interim with any problems.  Plan reviewed with Dr. Benay Spice.    Ned Card ANP/GNP-BC   02/20/2015  1:57 PM

## 2015-02-21 ENCOUNTER — Telehealth: Payer: Self-pay

## 2015-02-21 NOTE — Telephone Encounter (Signed)
Pt called to let us know she does have bloody nose when she blows it but only when she blows it. This has been going on for a few weeks. She let us know she has not been taking her iron pills. She did start her Stavarga last night. Spoke about appt tomorrow for blood transfusion.

## 2015-02-22 ENCOUNTER — Telehealth: Payer: Self-pay | Admitting: *Deleted

## 2015-02-22 ENCOUNTER — Emergency Department (HOSPITAL_COMMUNITY): Payer: Medicaid Other

## 2015-02-22 ENCOUNTER — Encounter (HOSPITAL_COMMUNITY): Payer: Self-pay | Admitting: Emergency Medicine

## 2015-02-22 ENCOUNTER — Emergency Department (HOSPITAL_COMMUNITY)
Admission: EM | Admit: 2015-02-22 | Discharge: 2015-02-22 | Disposition: A | Discharge status: Home / Self Care | Payer: Medicaid Other | Attending: Emergency Medicine | Admitting: Emergency Medicine

## 2015-02-22 ENCOUNTER — Ambulatory Visit (HOSPITAL_COMMUNITY)
Admission: RE | Admit: 2015-02-22 | Discharge: 2015-02-22 | Disposition: A | Discharge status: Home / Self Care | Payer: Medicaid Other | Source: Ambulatory Visit | Attending: Oncology | Admitting: Oncology

## 2015-02-22 VITALS — BP 109/68 | HR 85 | Temp 98.8°F | Resp 16

## 2015-02-22 DIAGNOSIS — I10 Essential (primary) hypertension: Secondary | ICD-10-CM | POA: Diagnosis not present

## 2015-02-22 DIAGNOSIS — C49A2 Gastrointestinal stromal tumor of stomach: Secondary | ICD-10-CM

## 2015-02-22 DIAGNOSIS — Z8639 Personal history of other endocrine, nutritional and metabolic disease: Secondary | ICD-10-CM | POA: Diagnosis not present

## 2015-02-22 DIAGNOSIS — R140 Abdominal distension (gaseous): Secondary | ICD-10-CM | POA: Diagnosis not present

## 2015-02-22 DIAGNOSIS — R0602 Shortness of breath: Secondary | ICD-10-CM | POA: Diagnosis present

## 2015-02-22 DIAGNOSIS — R231 Pallor: Secondary | ICD-10-CM | POA: Insufficient documentation

## 2015-02-22 DIAGNOSIS — Z87891 Personal history of nicotine dependence: Secondary | ICD-10-CM | POA: Insufficient documentation

## 2015-02-22 DIAGNOSIS — R0789 Other chest pain: Secondary | ICD-10-CM | POA: Diagnosis not present

## 2015-02-22 DIAGNOSIS — D649 Anemia, unspecified: Secondary | ICD-10-CM | POA: Diagnosis not present

## 2015-02-22 DIAGNOSIS — Z79899 Other long term (current) drug therapy: Secondary | ICD-10-CM | POA: Insufficient documentation

## 2015-02-22 DIAGNOSIS — G35 Multiple sclerosis: Secondary | ICD-10-CM | POA: Insufficient documentation

## 2015-02-22 DIAGNOSIS — Z8509 Personal history of malignant neoplasm of other digestive organs: Secondary | ICD-10-CM | POA: Diagnosis not present

## 2015-02-22 LAB — COMPREHENSIVE METABOLIC PANEL
ALK PHOS: 103 U/L (ref 38–126)
ALT: 11 U/L — AB (ref 14–54)
AST: 16 U/L (ref 15–41)
Albumin: 2.8 g/dL — ABNORMAL LOW (ref 3.5–5.0)
Anion gap: 12 (ref 5–15)
BILIRUBIN TOTAL: 0.7 mg/dL (ref 0.3–1.2)
BUN: 12 mg/dL (ref 6–20)
CALCIUM: 9.1 mg/dL (ref 8.9–10.3)
CHLORIDE: 105 mmol/L (ref 101–111)
CO2: 19 mmol/L — ABNORMAL LOW (ref 22–32)
CREATININE: 1.19 mg/dL — AB (ref 0.44–1.00)
GFR calc Af Amer: >60 mL/min (ref >60)
GFR, EST NON AFRICAN AMERICAN: 53 mL/min — AB (ref >60)
Glucose, Bld: 91 mg/dL (ref 65–99)
Potassium: 4 mmol/L (ref 3.5–5.1)
Sodium: 136 mmol/L (ref 135–145)
TOTAL PROTEIN: 7.3 g/dL (ref 6.5–8.1)

## 2015-02-22 LAB — CBC WITH DIFFERENTIAL/PLATELET
Basophils Absolute: 0 10*3/uL (ref 0.0–0.1)
Basophils Relative: 0 %
EOS ABS: 0 10*3/uL (ref 0.0–0.7)
EOS PCT: 1 %
HCT: 27.9 % — ABNORMAL LOW (ref 36.0–46.0)
Hemoglobin: 9 g/dL — ABNORMAL LOW (ref 12.0–15.0)
LYMPHS ABS: 0.8 10*3/uL (ref 0.7–4.0)
Lymphocytes Relative: 11 %
MCH: 30.1 pg (ref 26.0–34.0)
MCHC: 32.3 g/dL (ref 30.0–36.0)
MCV: 93.3 fL (ref 78.0–100.0)
Monocytes Absolute: 0.6 10*3/uL (ref 0.1–1.0)
Monocytes Relative: 7 %
Neutro Abs: 6.1 10*3/uL (ref 1.7–7.7)
Neutrophils Relative %: 81 %
PLATELETS: 471 10*3/uL — AB (ref 150–400)
RBC: 2.99 MIL/uL — AB (ref 3.87–5.11)
RDW: 18.7 % — ABNORMAL HIGH (ref 11.5–15.5)
WBC: 7.5 10*3/uL (ref 4.0–10.5)

## 2015-02-22 LAB — I-STAT TROPONIN, ED: Troponin i, poc: 0 ng/mL (ref 0.00–0.08)

## 2015-02-22 LAB — BRAIN NATRIURETIC PEPTIDE: B NATRIURETIC PEPTIDE 5: 107 pg/mL — AB (ref 0.0–100.0)

## 2015-02-22 MED ORDER — OXYCODONE-ACETAMINOPHEN 5-325 MG PO TABS
1.0000 | ORAL_TABLET | Freq: Once | ORAL | Status: AC
  Administered 2015-02-22: 1 via ORAL
  Filled 2015-02-22: qty 1

## 2015-02-22 MED ORDER — HEPARIN SOD (PORK) LOCK FLUSH 100 UNIT/ML IV SOLN: 500.0000 [IU] | Freq: Every day | INTRAVENOUS | Status: DC | PRN

## 2015-02-22 MED ORDER — IOHEXOL 350 MG/ML SOLN
100.0000 mL | Freq: Once | INTRAVENOUS | Status: AC | PRN
  Administered 2015-02-22: 67 mL via INTRAVENOUS

## 2015-02-22 MED ORDER — SODIUM CHLORIDE 0.9 % IV SOLN: 250.0000 mL | Freq: Once | INTRAVENOUS | Status: DC

## 2015-02-22 MED ORDER — HEPARIN SOD (PORK) LOCK FLUSH 100 UNIT/ML IV SOLN: 250.0000 [IU] | INTRAVENOUS | Status: DC | PRN

## 2015-02-22 MED ORDER — SODIUM CHLORIDE 0.9% FLUSH
10.0000 mL | INTRAVENOUS | Status: AC | PRN
  Administered 2015-02-22: 10 mL

## 2015-02-22 MED ORDER — SODIUM CHLORIDE 0.9% FLUSH: 3.0000 mL | INTRAVENOUS | Status: DC | PRN

## 2015-02-22 NOTE — Discharge Instructions (Signed)
You were seen in the emergency room for evaluation of shortness of breath and chest pain. Your labs, chest x-ray, and CT scan were unremarkable. Your symptoms are not due to a blood clot in your lungs, pneumonia, your heart, or other acute emergent pathology. Please follow up with your primary care provider and your oncologist as soon as possible for further evaluation and management of your symptoms. Return to the ER for new or worsening symptoms.

## 2015-02-22 NOTE — ED Notes (Signed)
Pt with Hx of abdominal cancer, abdominal mass, c/o chest pain at night and SOB. Pt had blood transfusion this morning at [facility]. Denies n/v. Endorses chronic abdominal pain s/t RLQ mass.

## 2015-02-22 NOTE — ED Notes (Signed)
Patient ambulated 50 feet with no assistance. At the beginning of the ambulation patient's O2 saturation was at 97% and heart rate was at 91 beats per minute. At the end of ambulation patient O2 saturation was at 98% and at one time during ambulation dropped to 94%. Heart rate was at 101 beats per minute. Patient complained of an ache in her chest when she inhaled.

## 2015-02-22 NOTE — ED Provider Notes (Signed)
CSN: [id number]     Arrival date & time 02/22/15  1441 History   First MD Initiated Contact with Patient 02/22/15 1519     Chief Complaint  Patient presents with  . Shortness of Breath    HPI   [name] is an 50 y.o. female with history of GI stromal tumor (currently undergoing chemo), symptomatic anemia, dyspnea on exertion, HTN, HLD who presents to the ED for evaluation of chest pain and SOB. She reports for the past few weeks she has felt increasingly short of breath, particularly on exertion. She states over the past few days in particular she gets very short of breath and tired even when walking down the hallway at home. Denies falls, feeling faint/dizzy, LOC. She states that the night before last she started experiencing right sided sharp, constant 8/10 chest pain. She states the pain came on while she was resting in bed. She states she does not feel like the chest pain correlates with her SOB. Denies diaphoresis or night sweats. Denies fever, chills, n/v/d. She does report abdominal pain currently which is chronic due to her tumor; she states she is due for her home dose of oxycodone. Of note, pt [facility] chronic [facility].   Past Medical History  Diagnosis Date  . Hypertension   . Hyperlipidemia   . MS (multiple sclerosis) ([place])   . Low back pain   . Dyslipidemia   . Vitamin D deficiency   . Cancer ([place])   . Gastrointestinal stromal tumor (GIST) of stomach 09/07/2012   Past Surgical History  Procedure Laterality Date  . Esophagogastroduodenoscopy N/A 09/04/2012    Procedure: ESOPHAGOGASTRODUODENOSCOPY (EGD);  Surgeon: Jerene Bears, MD;  Location: [facility];  Service: Gastroenterology;  Laterality: N/A;  . Laparotomy N/A 09/07/2012    Procedure: EXPLORATORY LAPAROTOMY;  Surgeon: Leighton Ruff, MD;  Location: [facility];  Service: General;  Laterality: N/A;  . Partial gastrectomy N/A 09/07/2012    Procedure: PARTIAL  GASTRECTOMY/RESECTION OF ABDOMINAL MASS;  Surgeon: Leighton Ruff, MD;  Location: [facility];  Service: General;  Laterality: N/A;   Family History  Problem Relation Age of Onset  . Stroke Mother   . Stroke Father   . Schizophrenia Brother   . Hypertension Brother   . Hypertension Brother    Social History  Substance Use Topics  . Smoking status: Former Smoker    Quit date: 01/09/2002  . Smokeless tobacco: Never Used  . Alcohol Use: No   OB History    No data available     Review of Systems  All other systems reviewed and are negative.     Allergies  Review of patient's allergies indicates no known allergies.  Home Medications   Prior to Admission medications   Medication Sig Start Date End Date Taking? Authorizing Provider  Alum & Mag Hydroxide-Simeth (MAGIC MOUTHWASH) SOLN Take 5 mLs by mouth 3 (three) times daily. Swish and spit Patient not taking: Reported on 12/12/2014 07/06/14   Owens Shark, NP  ferrous sulfate (FERROUSUL) 325 (65 FE) MG tablet Take 1 tablet (325 mg total) by mouth daily with breakfast. [id number]   Leighton Ruff, MD  glatiramer (COPAXONE) 20 MG/ML SOSY injection Inject 1 mL (20 mg total) into the skin daily. 06/28/14   Pieter Partridge, DO  HYDROcodone-acetaminophen (NORCO/VICODIN) 5-325 MG tablet Take 1-2 tablets by mouth every 4 (four) hours as needed for moderate pain. 01/30/15   Ladell Pier,  MD  megestrol (MEGACE) 40 MG/ML suspension Take 5 mLs (200 mg total) by mouth 2 (two) times daily. 01/03/15   Ladell Pier, MD  oxyCODONE (OXY IR/ROXICODONE) 5 MG immediate release tablet Take 1-2 tablets (5-10 mg total) by mouth every 4 (four) hours as needed for severe pain. 02/14/15   Ladell Pier, MD  potassium chloride SA (K-DUR,KLOR-CON) 20 MEQ tablet Take 1 tablet (20 mEq total) by mouth daily. Patient not taking: Reported on 12/12/2014 02/24/14   Ladell Pier, MD  prochlorperazine (COMPAZINE) 10 MG tablet Take 1 tablet (10 mg total) by mouth every 6 (six)  hours as needed for nausea or vomiting. Patient not taking: Reported on 12/12/2014 05/15/14   Ladell Pier, MD  regorafenib [facility]) 40 MG tablet Take 4 tablets (160 mg total) by mouth daily with breakfast. 21 days on then 7 days off. Take with low fat meal. Caution: Chemotherapy. 02/20/15   Ladell Pier, MD   LMP 01/27/2015 (Approximate) Physical Exam  Constitutional: She is oriented to person, place, and time.  Appears uncomfortable, NAD  HENT:  Right Ear: External ear normal.  Left Ear: External ear normal.  Nose: Nose normal.  Mouth/Throat: Oropharynx is clear and moist. No oropharyngeal exudate.  Eyes: Conjunctivae and EOM are normal. Pupils are equal, round, and reactive to light.  Neck: Normal range of motion. Neck supple.  Cardiovascular: Normal rate, regular rhythm, normal heart sounds and intact distal pulses.   Pulmonary/Chest: Effort normal and breath sounds normal. No respiratory distress. She has no wheezes. She has no rales. She exhibits no tenderness.  Speaking comfortably in full sentences. No increased WOB, tachypnea, accessory muscle usage, or tripoding.   Abdominal: Soft. Bowel sounds are normal. She exhibits distension and mass. There is tenderness. There is no rebound and no guarding.  Abdomen is distended with large, palpable, tender mass.   Musculoskeletal: She exhibits no edema.  Neurological: She is alert and oriented to person, place, and time. No cranial nerve deficit.  Skin: Skin is warm and dry. There is pallor.  Psychiatric: She has a normal mood and affect.  Nursing note and vitals reviewed.   ED Course  Procedures (including critical care time) Labs Review Labs Reviewed  COMPREHENSIVE METABOLIC PANEL - Abnormal; Notable for the following:    CO2 19 (*)    Creatinine, Ser 1.19 (*)    Albumin 2.8 (*)    ALT 11 (*)    GFR calc non Af Amer 53 (*)    All other components within normal limits  CBC WITH DIFFERENTIAL/PLATELET - Abnormal; Notable for  the following:    RBC 2.99 (*)    Hemoglobin 9.0 (*)    HCT 27.9 (*)    RDW 18.7 (*)    Platelets 471 (*)    All other components within normal limits  BRAIN NATRIURETIC PEPTIDE - Abnormal; Notable for the following:    B Natriuretic Peptide 107.0 (*)    All other components within normal limits  I-STAT TROPOININ, ED    Imaging Review Dg Chest 2 View  02/22/2015  CLINICAL DATA:  Chest pain and shortness of breath for 2 days. Anemia. Gastrointestinal stromal tumor. EXAM: CHEST  2 VIEW COMPARISON:  September 10, 2012 FINDINGS: There is mild scarring in the left base region. There is mild elevation of the right hemidiaphragm. There is no edema or consolidation. The heart size and pulmonary vascularity are normal. No mass or adenopathy is apparent. No bone lesions are apparent. There  is postoperative change in the upper abdomen. Mediastinal contours are within normal limits. Both lungs are clear. The visualized skeletal structures are unremarkable. IMPRESSION: No edema or consolidation. Mild scarring left base. Stable mild elevation of the right hemidiaphragm. No change in cardiac silhouette. Electronically Signed   By: Lowella Grip III M.D.   On: 02/22/2015 15:52   Ct Angio Chest Pe W/cm &/or Wo Cm  02/22/2015  CLINICAL DATA:  Dyspnea on exertion. Pain. History of a gastrointestinal stromal tumor. EXAM: CT ANGIOGRAPHY CHEST WITH CONTRAST TECHNIQUE: Multidetector CT imaging of the chest was performed using the standard protocol during bolus administration of intravenous contrast. Multiplanar CT image reconstructions and MIPs were obtained to evaluate the vascular anatomy. CONTRAST:  66mL OMNIPAQUE IOHEXOL 350 MG/ML SOLN COMPARISON:  Current chest radiograph. Abdomen and pelvis CT, 01/17/2015. FINDINGS: Angiographic study: No evidence of a pulmonary embolus. Main pulmonary artery dilated to 4 cm. Thoracic aorta is normal in caliber. There is no dissection. No atherosclerotic plaque along the aorta or  its branch vessels. Neck base and axilla:  No mass or adenopathy. Mediastinum and hila: Heart normal in size and configuration. No mediastinal or hilar masses or enlarged lymph nodes. Lungs and pleura: Right hemidiaphragm is elevated. There is opacity and volume loss in the right lower lobe consistent with atelectasis. Mild atelectasis is noted at the base of the right middle lobe with mild atelectasis noted in the dependent left lower lobe and posterior inferior left upper lobe lingula. There is no convincing pneumonia. There is no pulmonary edema. No lung mass or nodule. No pleural effusion or pneumothorax. Limited upper abdomen: Small amount ascites. Large hypo attenuating mass is. These findings are stable when compared to the recent prior abdomen and pelvis CT. Musculoskeletal:  No osteoblastic or osteolytic lesions. Review of the MIP images confirms the above findings. IMPRESSION: 1. No evidence of a pulmonary embolus. 2. Lung base atelectasis, greater on the right with elevation of right hemidiaphragm. No evidence of pneumonia or pulmonary edema. 3. Metastatic gastrointestinal stromal tumor with large upper abdominal masses and a small amount of ascites, without significant change from the recent prior abdomen and pelvis CT. No evidence of metastatic disease in the chest. Electronically Signed   By: Lajean Manes M.D.   On: 02/22/2015 17:26   I have personally reviewed and evaluated these images and lab results as part of my medical decision-making.   EKG Interpretation None       EKG not crossing over to EPIC.  MDM   Final diagnoses:  Atypical chest pain  Shortness of breath    Pt did not become hypoxic, hypotensive with ambulation. HR did increase slightly and then normalized. She did complain of chest pain/pressure with ambulation. CMP and CBC at baseline. CXR shows chronic changes but no acute pathology. Given CP, SOB, h/o malignancy, will obtain CTA PE to r/o PE.   CT shows no e/o PE.  There is some chronic atelectasis. No e/o pneumonia. She does states she feels better now that she has been resting in the ED. Suspect her anemia of chronic dz might be the cause of her symptoms though CBC looks good today after this morning's transfusion. Pt stable for discharge with outpatient f/u. Instructed to f/u with heme/onc and PCP. ER return precautions given.   Anne Ng, PA-C 02/23/15 1609  Leonard Schwartz, MD 02/24/15 [phone]

## 2015-02-22 NOTE — Telephone Encounter (Addendum)
"  I need Dr. Benay Spice to change the Stivarga to something else.  I have chest pain, Hard time breathing and out of breath when I do anything.  I walk like an old woman.  I can't sleep.  The past two nights I've been up all night with the pain.  I started this cycle 02-20-2015 and take the medicine at 9:00 pm.  I take oxycodone and still in pain."  Advised she go to the ED.    Dr. Benay Spice notified.  Orders from Dr. Benay Spice to go to the ED for further evaluation.  Stivargo should not be causing these symptoms but she can hold it for one night and let us know if she feels better.  Try maalox or mylanta keeping head elevated. This nurse can hear dragging, tiredness in her voice from previous calls.  Advised she go to the ED and tell staff exactly what has been shared with this call.    Call ended and now see she was at [facility] for blood transfusion earlier today.

## 2015-02-22 NOTE — Progress Notes (Signed)
[id number] Verlene Mayer, NP  Diagnosis: Malignant gastrointestinal stromal tumor (GIST) of stomach (HCC) (151.9)  Procedure Note: 2 Units PRBC transfusion  Transfusion completed.Marland KitchenMarland KitchenMarland KitchenTolerated transfusion well. No reaction. Stable at discharge. Discharged to home.

## 2015-02-23 ENCOUNTER — Telehealth: Payer: Self-pay

## 2015-02-23 LAB — TYPE AND SCREEN
ABO/RH(D): O POS
ANTIBODY SCREEN: NEGATIVE
UNIT DIVISION: 0
Unit division: 0

## 2015-02-23 NOTE — Telephone Encounter (Signed)
Pt LM on triage VM.  Pt wants Dr. Benay Spice know that she went to the ED last night and all test results are normal.  Pt believes chest pain at night was being caused by medication, did not take it last night and reports absence of that symptom.  Pt requests surgery for tumor removal and would like to be seen prior to her 2/22 fu.  Forwarded to pod 2 for further action.

## 2015-02-26 ENCOUNTER — Other Ambulatory Visit: Payer: Self-pay | Admitting: *Deleted

## 2015-02-26 MED ORDER — OXYCODONE HCL 5 MG PO TABS: 5.0000 mg | ORAL_TABLET | ORAL | Status: DC | PRN

## 2015-02-26 NOTE — Telephone Encounter (Signed)
Pt called requesting refill on pain medication.  Noted last script was for oxy ir 5 mg on 02/14/2015 for 75 tabs.  This request will be sent to MD and nurse at the desk for fill and pt contact.  Return call number given for pt as [phone]

## 2015-02-26 NOTE — Addendum Note (Signed)
Addended by: Brien Few on: 02/26/2015 05:23 PM   Modules accepted: Orders

## 2015-02-27 ENCOUNTER — Other Ambulatory Visit: Payer: Medicaid Other

## 2015-02-27 ENCOUNTER — Telehealth: Payer: Self-pay | Admitting: Nurse Practitioner

## 2015-02-27 NOTE — Telephone Encounter (Signed)
cld & spoke to pt and adv of appt on 2/16 gave time & date

## 2015-02-28 MED FILL — oxyCODONE HCL 5 MG TABS: 5 | 6 days supply | Qty: 75 | Fill #0

## 2015-03-01 ENCOUNTER — Other Ambulatory Visit (HOSPITAL_BASED_OUTPATIENT_CLINIC_OR_DEPARTMENT_OTHER): Payer: Medicaid Other

## 2015-03-01 ENCOUNTER — Ambulatory Visit (HOSPITAL_BASED_OUTPATIENT_CLINIC_OR_DEPARTMENT_OTHER): Payer: Medicaid Other | Admitting: Nurse Practitioner

## 2015-03-01 VITALS — BP 118/72 | HR 93 | Temp 98.1°F | Resp 18 | Ht 68.0 in | Wt 167.2 lb

## 2015-03-01 DIAGNOSIS — G893 Neoplasm related pain (acute) (chronic): Secondary | ICD-10-CM | POA: Diagnosis not present

## 2015-03-01 DIAGNOSIS — D500 Iron deficiency anemia secondary to blood loss (chronic): Secondary | ICD-10-CM | POA: Diagnosis not present

## 2015-03-01 DIAGNOSIS — C49A2 Gastrointestinal stromal tumor of stomach: Secondary | ICD-10-CM

## 2015-03-01 DIAGNOSIS — N289 Disorder of kidney and ureter, unspecified: Secondary | ICD-10-CM

## 2015-03-01 LAB — CBC WITH DIFFERENTIAL/PLATELET
BASO%: 0.5 % (ref 0.0–2.0)
Basophils Absolute: 0.1 10*3/uL (ref 0.0–0.1)
EOS%: 0.4 % (ref 0.0–7.0)
Eosinophils Absolute: 0 10*3/uL (ref 0.0–0.5)
HCT: 27.1 % — ABNORMAL LOW (ref 34.8–46.6)
HGB: 8.6 g/dL — ABNORMAL LOW (ref 11.6–15.9)
LYMPH%: 9.4 % — AB (ref 14.0–49.7)
MCH: 29.8 pg (ref 25.1–34.0)
MCHC: 31.6 g/dL (ref 31.5–36.0)
MCV: 94.3 fL (ref 79.5–101.0)
MONO#: 0.6 10*3/uL (ref 0.1–0.9)
MONO%: 5.7 % (ref 0.0–14.0)
NEUT%: 84 % — ABNORMAL HIGH (ref 38.4–76.8)
NEUTROS ABS: 8.6 10*3/uL — AB (ref 1.5–6.5)
Platelets: 531 10*3/uL — ABNORMAL HIGH (ref 145–400)
RBC: 2.87 10*6/uL — AB (ref 3.70–5.45)
RDW: 20 % — AB (ref 11.2–14.5)
WBC: 10.2 10*3/uL (ref 3.9–10.3)
lymph#: 1 10*3/uL (ref 0.9–3.3)

## 2015-03-01 MED ORDER — MORPHINE SULFATE 15 MG PO TABS: 15.0000 mg | ORAL_TABLET | ORAL | Status: DC | PRN

## 2015-03-01 NOTE — Progress Notes (Addendum)
[name] OFFICE PROGRESS NOTE   Diagnosis:  Gastrointestinal stromal tumor  INTERVAL HISTORY:   Ms. Kitsmiller returns as scheduled. She began cycle 2 Regorafenib beginning 02/20/2015. She received a blood transfusion on 02/22/2015. She was evaluated in the emergency department in the afternoon on 02/22/2015 for complaints of chest pain and shortness of breath. Hemoglobin was improved at 9.0. Other labs unrevealing as to etiology. Chest CT negative for pulmonary embolus. Large upper abdominal masses and a small amount of ascites again noted. No metastatic disease in the chest.  She feels the chest pain and shortness of breath were related to Regorafenib. She discontinued Regorafenib on 02/22/2015. She has had no further chest pain or shortness of breath. She continues to have abdominal pain. She is taking oxycodone which works intermittently. Appetite is poor due to the pain. She has some nausea. Bowels moving. No bleeding. She did not completely stool cards.  Objective:  Vital signs in last 24 hours:  Blood pressure 118/72, pulse 93, temperature 98.1 F (36.7 C), temperature source Oral, resp. rate 18, height 5\' 8"  (1.727 m), weight 167 lb 3.2 oz (75.841 kg), last menstrual period 01/27/2015, SpO2 100 %.    HEENT: No thrush or ulcers. Resp: Breath sounds diminished left lower lung field. No respiratory distress. Cardio: Regular rate and rhythm. GI: Abdomen is distended. Firm throughout the right upper abdomen and left mid abdomen. Vascular: No leg edema.   Lab Results:  Lab Results  Component Value Date   WBC 10.2 03/01/2015   HGB 8.6* 03/01/2015   HCT 27.1* 03/01/2015   MCV 94.3 03/01/2015   PLT 531* 03/01/2015   NEUTROABS 8.6* 03/01/2015    Imaging:  No results found.  Medications: I have reviewed the patient's current medications.  Assessment/Plan: 1. Gastrointestinal stromal tumor of the stomach, high grade, stage IIIB (T4 NX), ruptured tumor, status  post partial gastrectomy 09/07/2012. She began adjuvant Gleevec 10/19/2012 with a three-year course planned.  She discontinued Gleevec 06/20/2013  CT 01/09/2014 with numerous soft tissue masses filling the abdomen and pelvis consistent with recurrent tumor. Soft tissue masses caused significant narrowing of the gastric lumen. Small and large bowel loops appeared narrowed/compressed but did not appear to be obstructed. Findings suspicious for right hilar lymphadenopathy or tumor nodularity.   Biopsy of an abdominal mass 01/11/2014 showed sheets of spindle cells with myxoid differentiation. The morphology was similar to the patient's previous gastrointestinal stromal tumor.  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  Gleevec dose increased to 400 mg daily 05/15/2014  CT scan 06/29/2014 with progressive large abdominal masses with significant mass effect on the solid abdominal organs and bowel. No bowel obstruction.  Gleevec discontinued and sunitinib initiated 37.5 mg daily following office visit 06/15/2014.  Sunitinib placed on hold 07/18/2014 due to mouth soreness and skin rash.  Sunitinib resumed at a dose of 25 mg daily on 07/31/2014  CT abdomen/pelvis 10/10/2014 showed redemonstration of massive low-density abdominal/peritoneal masses. Dominant right-sided mass slightly decreased in size. Left-sided dominant mass similar.  Sunitinib continued 25 mg daily.  Restaging CT abdomen/pelvis 01/17/2015 with progression.  Initiation of regorafenib (01/23/2015)  Cycle 2 Regorafenib 02/20/2015; patient discontinued on 02/22/2015 due to concerns medication was causing chest pain and shortness of breath. She declines to resume Regorafenib. 2. Multiple sclerosis followed  by  Dr. Tomi Likens 3. History of anemia secondary to blood loss from #1. Persistent severe anemia. Stool negative for occult blood 05/30/2014. She was transfused 2 units of blood on 06/02/2014. Transfused 2 units of blood 02/22/2015. 4. Renal dysfunction. Improved. 5. History of hypokalemia-now taking a potassium supplement 6. Pain secondary to the large right abdominal mass with displacement of the right kidney 7. Weight loss.  8. Lower extremity edema. Question related to [place]. Improved.   Disposition: Ms. Iiams began a second cycle of Regorafenib on 02/20/2015. She discontinued the Regorafenib on 02/22/2015 due to concerns the medication was causing chest pain and shortness of breath. She declines to resume Regorafenib.   Dr. Benay Spice discussed other options to include Dasatinib, Nilotinib, referral for a second opinion/clinical trial. She does not have transportation to obtain a second opinion out of town.  For the abdominal pain she will try MSIR 15 mg 1-2 tablets every 4 hours as needed.  She will keep her scheduled follow-up appointment on 03/06/2015 for additional discussion regarding treatment options.  Patient seen with Dr. Benay Spice. 25 minutes were spent face-to-face at today's visit with the majority of that time involved in counseling/coordination of care.   Ned Card ANP/GNP-BC   03/01/2015  3:02 PM  This was a shared visit with Ned Card. Ms. Chatfield was interviewed and examined. She has increased pain and the abdomen appears more distended. She declines resuming regular Regorafenib. She understands surgery is not an option for treating the gastrointestinal stromal tumor. We adjusted the narcotic regimen today. I will consider salvage treatment options including increased dose Gleevec and other tyrosine kinase inhibitors. She will check into availability of transportation to take her for a second opinion to [facility] or [facility].  Julieanne Manson, M.D.

## 2015-03-05 ENCOUNTER — Telehealth: Payer: Self-pay | Admitting: Nurse Practitioner

## 2015-03-05 MED FILL — TASIGNA 200 MG CAPSULE: 200 | 28 days supply | Qty: 112 | Fill #0

## 2015-03-05 NOTE — Telephone Encounter (Signed)
As per Owens Shark NP prescription faxed to [facility] for Nilotinib 400 mg BID 1 month supply.

## 2015-03-06 ENCOUNTER — Encounter: Payer: Self-pay | Admitting: Nurse Practitioner

## 2015-03-06 ENCOUNTER — Other Ambulatory Visit (HOSPITAL_BASED_OUTPATIENT_CLINIC_OR_DEPARTMENT_OTHER): Payer: Medicaid Other

## 2015-03-06 ENCOUNTER — Ambulatory Visit (HOSPITAL_BASED_OUTPATIENT_CLINIC_OR_DEPARTMENT_OTHER): Payer: Medicaid Other | Admitting: Nurse Practitioner

## 2015-03-06 ENCOUNTER — Other Ambulatory Visit: Payer: Self-pay

## 2015-03-06 ENCOUNTER — Telehealth: Payer: Self-pay | Admitting: *Deleted

## 2015-03-06 VITALS — BP 114/63 | HR 80 | Temp 98.3°F | Resp 18 | Ht 68.0 in | Wt 170.1 lb

## 2015-03-06 DIAGNOSIS — D500 Iron deficiency anemia secondary to blood loss (chronic): Secondary | ICD-10-CM | POA: Diagnosis not present

## 2015-03-06 DIAGNOSIS — C49A2 Gastrointestinal stromal tumor of stomach: Secondary | ICD-10-CM | POA: Diagnosis not present

## 2015-03-06 DIAGNOSIS — E876 Hypokalemia: Secondary | ICD-10-CM

## 2015-03-06 LAB — CBC WITH DIFFERENTIAL/PLATELET
BASO%: 0.2 % (ref 0.0–2.0)
Basophils Absolute: 0 10*3/uL (ref 0.0–0.1)
EOS ABS: 0 10*3/uL (ref 0.0–0.5)
EOS%: 0.4 % (ref 0.0–7.0)
HCT: 25.7 % — ABNORMAL LOW (ref 34.8–46.6)
HGB: 7.8 g/dL — ABNORMAL LOW (ref 11.6–15.9)
LYMPH#: 0.9 10*3/uL (ref 0.9–3.3)
LYMPH%: 8.2 % — AB (ref 14.0–49.7)
MCH: 29 pg (ref 25.1–34.0)
MCHC: 30.4 g/dL — ABNORMAL LOW (ref 31.5–36.0)
MCV: 95.5 fL (ref 79.5–101.0)
MONO#: 0.5 10*3/uL (ref 0.1–0.9)
MONO%: 4.2 % (ref 0.0–14.0)
NEUT#: 9.4 10*3/uL — ABNORMAL HIGH (ref 1.5–6.5)
NEUT%: 87 % — ABNORMAL HIGH (ref 38.4–76.8)
Platelets: 474 10*3/uL — ABNORMAL HIGH (ref 145–400)
RBC: 2.69 10*6/uL — AB (ref 3.70–5.45)
RDW: 19.3 % — ABNORMAL HIGH (ref 11.2–14.5)
WBC: 10.8 10*3/uL — AB (ref 3.9–10.3)

## 2015-03-06 LAB — COMPREHENSIVE METABOLIC PANEL
ALK PHOS: 120 U/L (ref 40–150)
ALT: <9 U/L (ref 0–55)
AST: 11 U/L (ref 5–34)
Albumin: 2.4 g/dL — ABNORMAL LOW (ref 3.5–5.0)
Anion Gap: 11 mEq/L (ref 3–11)
BUN: 11.5 mg/dL (ref 7.0–26.0)
CO2: 19 meq/L — AB (ref 22–29)
Calcium: 9.6 mg/dL (ref 8.4–10.4)
Chloride: 104 mEq/L (ref 98–109)
Creatinine: 1.4 mg/dL — ABNORMAL HIGH (ref 0.6–1.1)
EGFR: 52 mL/min/{1.73_m2} — AB (ref >90)
GLUCOSE: 97 mg/dL (ref 70–140)
POTASSIUM: 4.1 meq/L (ref 3.5–5.1)
SODIUM: 135 meq/L — AB (ref 136–145)
Total Bilirubin: 0.61 mg/dL (ref 0.20–1.20)
Total Protein: 7.3 g/dL (ref 6.4–8.3)

## 2015-03-06 LAB — SAMPLE TO BLOOD BANK

## 2015-03-06 MED FILL — MORPHINE SULFATE IR 15 MG T: 15 | 5 days supply | Qty: 60 | Fill #0

## 2015-03-06 NOTE — Telephone Encounter (Signed)
"  Please tell them to hurry up.  I was to be seen at 1:45 pm and have [employer] picking me up at 3:30 pm and I ned to go to the pharmacy before they get here."  Advised patient lab appointment at 1:45 followed by F/U at 2:15.  Notified Nurse tech Office of this request.  Patient arrival time: 2:07.

## 2015-03-06 NOTE — Progress Notes (Addendum)
[place] OFFICE PROGRESS NOTE   Diagnosis:  Gastrointestinal stromal tumor  INTERVAL HISTORY:   Monica [name] returns as scheduled. She continues to have abdominal pain. Oxycodone does not help. She has not picked up the morphine from the pharmacy prescribed at the time of her last visit. She plans to obtain the morphine today. No chest pain or shortness of breath. No bloody or black stools. She has had constipation recently.  Objective:  Vital signs in last 24 hours:  Blood pressure 114/63, pulse 80, temperature 98.3 F (36.8 C), temperature source Oral, resp. rate 18, height 5\' 8"  (1.727 m), weight 170 lb 1.6 oz (77.157 kg), last menstrual period 01/27/2015, SpO2 100 %.    HEENT: Thick white coating over tongue. Resp: Lungs are clear. Breath sounds are diminished at the bases right side greater than left. No respiratory distress. Cardio: Regular rate and rhythm. GI: Abdomen is distended and firm throughout the right upper abdomen and left mid abdomen. Vascular: Trace lower leg edema bilaterally.   Lab Results:  Lab Results  Component Value Date   WBC 10.2 03/01/2015   HGB 8.6* 03/01/2015   HCT 27.1* 03/01/2015   MCV 94.3 03/01/2015   PLT 531* 03/01/2015   NEUTROABS 8.6* 03/01/2015    Imaging:  No results found.  Medications: I have reviewed the patient's current medications.  Assessment/Plan: 1. Gastrointestinal stromal tumor of the stomach, high grade, stage IIIB (T4 NX), ruptured tumor, status post partial gastrectomy 09/07/2012. She began adjuvant Gleevec 10/19/2012 with a three-year course planned.  She discontinued Gleevec 06/20/2013  CT 01/09/2014 with numerous soft tissue masses filling the abdomen and pelvis consistent with recurrent tumor. Soft tissue masses caused significant narrowing of the gastric lumen. Small and large bowel loops appeared narrowed/compressed but did not appear to be obstructed. Findings suspicious for right hilar  lymphadenopathy or tumor nodularity.   Biopsy of an abdominal mass 01/11/2014 showed sheets of spindle cells with myxoid differentiation. The morphology was similar to the patient's previous gastrointestinal stromal tumor.  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  Gleevec dose increased to 400 mg daily 05/15/2014  CT scan 06/29/2014 with progressive large abdominal masses with significant mass effect on the solid abdominal organs and bowel. No bowel obstruction.  Gleevec discontinued and sunitinib initiated 37.5 mg daily following office visit 06/15/2014.  Sunitinib placed on hold 07/18/2014 due to mouth soreness and skin rash.  Sunitinib resumed at a dose of 25 mg daily on 07/31/2014  CT abdomen/pelvis 10/10/2014 showed redemonstration of massive low-density abdominal/peritoneal masses. Dominant right-sided mass slightly decreased in size. Left-sided dominant mass similar.  Sunitinib continued 25 mg daily.  Restaging CT abdomen/pelvis 01/17/2015 with progression.  Initiation of regorafenib (01/23/2015)  Cycle 2 Regorafenib 02/20/2015; patient discontinued on 02/22/2015 due to concerns medication was causing chest pain and shortness of breath. She declines to resume Regorafenib.  Initiation of Nilotinib 03/06/2015 2. Multiple sclerosis followed by Dr. Tomi Likens 3. History of anemia secondary to blood loss from #1. Persistent severe anemia. Stool negative for occult blood 05/30/2014. She was transfused 2 units of blood on 06/02/2014. Transfused 2 units of blood 02/22/2015. 4. Renal dysfunction. Improved. 5. History of hypokalemia-now taking a potassium supplement 6. Pain secondary to the large right abdominal mass with displacement of the right kidney 7. Weight  loss.  8. Lower extremity edema. Question related to [place]. Improved.   Disposition: Monica [name] appears unchanged. She has declined further Regorafenib. Dr. Benay Spice recommends a trial of Nilotinib. We reviewed potential toxicities including myelosuppression, peripheral edema, fatigue, rash, alopecia, nausea, vomiting, diarrhea. We discussed the possibility of reactivation of hepatitis B. We discussed the possibility of QT prolongation. We will obtain baseline hepatitis B serologies and an EKG at today's visit. She will return in one week for a follow-up visit and second EKG.  She will contact the office prior to her next visit with any problems.  Patient seen with Dr. Benay Spice.    Ned Card ANP/GNP-BC   03/06/2015  3:12 PM  This was a shared visit with Ned Card. Monica [name] will begin salvage therapy with Nilotinib. We reviewed the potential toxicities associated with nilotinib and she agrees to proceed. She continues to have severe anemia from systemic therapy and potentially bleeding related to the GIST. We will arrange for a red cell transfusion.  Julieanne Manson, M.D.

## 2015-03-07 ENCOUNTER — Telehealth: Payer: Self-pay | Admitting: *Deleted

## 2015-03-07 ENCOUNTER — Other Ambulatory Visit: Payer: Self-pay | Admitting: Nurse Practitioner

## 2015-03-07 ENCOUNTER — Telehealth: Payer: Self-pay | Admitting: Pharmacist

## 2015-03-07 DIAGNOSIS — C49A2 Gastrointestinal stromal tumor of stomach: Secondary | ICD-10-CM

## 2015-03-07 DIAGNOSIS — D649 Anemia, unspecified: Secondary | ICD-10-CM

## 2015-03-07 LAB — HEPATITIS B SURFACE ANTIGEN: HEP B S AG: NEGATIVE

## 2015-03-07 LAB — HEPATITIS B CORE ANTIBODY, TOTAL: HEP B C TOTAL AB: NEGATIVE

## 2015-03-07 MED ORDER — NILOTINIB HCL 200 MG PO CAPS: 400.0000 mg | ORAL_CAPSULE | Freq: Two times a day (BID) | ORAL | Status: DC

## 2015-03-07 NOTE — Telephone Encounter (Signed)
03/07/15: Attempted to reach patient for follow up on oral medication: New start Tasigna (stopping Stivarga). No answer. Left VM for patient to call back with any questions or issues.   Thank you,  Montel Clock, PharmD, [facility] [phone]

## 2015-03-07 NOTE — Telephone Encounter (Signed)
"  When am I to receive blood transfusion Friday and how many units?"  03-09-2015 appointment start time 10:15 am provided and two units ordered.  She will call [employer].

## 2015-03-09 ENCOUNTER — Ambulatory Visit (HOSPITAL_BASED_OUTPATIENT_CLINIC_OR_DEPARTMENT_OTHER): Payer: Medicaid Other | Admitting: Nurse Practitioner

## 2015-03-09 ENCOUNTER — Other Ambulatory Visit: Payer: Self-pay | Admitting: Nurse Practitioner

## 2015-03-09 ENCOUNTER — Ambulatory Visit (HOSPITAL_BASED_OUTPATIENT_CLINIC_OR_DEPARTMENT_OTHER): Payer: Medicaid Other

## 2015-03-09 ENCOUNTER — Encounter: Payer: Self-pay | Admitting: *Deleted

## 2015-03-09 VITALS — BP 112/66 | HR 91 | Temp 98.5°F | Resp 18

## 2015-03-09 DIAGNOSIS — D649 Anemia, unspecified: Secondary | ICD-10-CM

## 2015-03-09 DIAGNOSIS — C49A2 Gastrointestinal stromal tumor of stomach: Secondary | ICD-10-CM | POA: Diagnosis present

## 2015-03-09 DIAGNOSIS — R109 Unspecified abdominal pain: Secondary | ICD-10-CM | POA: Diagnosis present

## 2015-03-09 LAB — CBC WITH DIFFERENTIAL/PLATELET
BASO%: 0.2 % (ref 0.0–2.0)
Basophils Absolute: 0 10*3/uL (ref 0.0–0.1)
EOS%: 0.6 % (ref 0.0–7.0)
Eosinophils Absolute: 0.1 10*3/uL (ref 0.0–0.5)
HCT: 23.7 % — ABNORMAL LOW (ref 34.8–46.6)
HGB: 7.2 g/dL — ABNORMAL LOW (ref 11.6–15.9)
LYMPH%: 9.9 % — AB (ref 14.0–49.7)
MCH: 28.6 pg (ref 25.1–34.0)
MCHC: 30.4 g/dL — AB (ref 31.5–36.0)
MCV: 94 fL (ref 79.5–101.0)
MONO#: 0.6 10*3/uL (ref 0.1–0.9)
MONO%: 6.1 % (ref 0.0–14.0)
NEUT#: 8.5 10*3/uL — ABNORMAL HIGH (ref 1.5–6.5)
NEUT%: 83.2 % — AB (ref 38.4–76.8)
Platelets: 505 10*3/uL — ABNORMAL HIGH (ref 145–400)
RBC: 2.52 10*6/uL — ABNORMAL LOW (ref 3.70–5.45)
RDW: 19.4 % — ABNORMAL HIGH (ref 11.2–14.5)
WBC: 10.2 10*3/uL (ref 3.9–10.3)
lymph#: 1 10*3/uL (ref 0.9–3.3)
nRBC: 0 % (ref 0–0)

## 2015-03-09 LAB — COMPREHENSIVE METABOLIC PANEL
ALT: <9 U/L (ref 0–55)
AST: 10 U/L (ref 5–34)
Albumin: 2.3 g/dL — ABNORMAL LOW (ref 3.5–5.0)
Alkaline Phosphatase: 119 U/L (ref 40–150)
Anion Gap: 10 mEq/L (ref 3–11)
BUN: 16.4 mg/dL (ref 7.0–26.0)
CHLORIDE: 104 meq/L (ref 98–109)
CO2: 19 mEq/L — ABNORMAL LOW (ref 22–29)
Calcium: 9.2 mg/dL (ref 8.4–10.4)
Creatinine: 1.7 mg/dL — ABNORMAL HIGH (ref 0.6–1.1)
EGFR: 41 mL/min/{1.73_m2} — ABNORMAL LOW (ref >90)
GLUCOSE: 125 mg/dL (ref 70–140)
POTASSIUM: 4.7 meq/L (ref 3.5–5.1)
SODIUM: 134 meq/L — AB (ref 136–145)
Total Bilirubin: 0.9 mg/dL (ref 0.20–1.20)
Total Protein: 7 g/dL (ref 6.4–8.3)

## 2015-03-09 LAB — PREPARE RBC (CROSSMATCH)

## 2015-03-09 MED ORDER — SODIUM CHLORIDE 0.9% FLUSH
3.0000 mL | INTRAVENOUS | Status: DC | PRN
  Filled 2015-03-09: qty 10

## 2015-03-09 MED ORDER — HEPARIN SOD (PORK) LOCK FLUSH 100 UNIT/ML IV SOLN
250.0000 [IU] | INTRAVENOUS | Status: DC | PRN
  Filled 2015-03-09: qty 5

## 2015-03-09 MED ORDER — SODIUM CHLORIDE 0.9% FLUSH
10.0000 mL | INTRAVENOUS | Status: DC | PRN
  Filled 2015-03-09: qty 10

## 2015-03-09 MED ORDER — SODIUM CHLORIDE 0.9 % IV SOLN
250.0000 mL | Freq: Once | INTRAVENOUS | Status: AC
  Administered 2015-03-09: 250 mL via INTRAVENOUS

## 2015-03-09 MED ORDER — HEPARIN SOD (PORK) LOCK FLUSH 100 UNIT/ML IV SOLN
500.0000 [IU] | Freq: Every day | INTRAVENOUS | Status: DC | PRN
  Filled 2015-03-09: qty 5

## 2015-03-09 MED ORDER — MORPHINE SULFATE (PF) 4 MG/ML IV SOLN
INTRAVENOUS | Status: AC
  Filled 2015-03-09: qty 1

## 2015-03-09 MED ORDER — MORPHINE SULFATE 4 MG/ML IJ SOLN
4.0000 mg | Freq: Once | INTRAMUSCULAR | Status: AC
Start: 2015-03-09 — End: 2015-03-09
  Administered 2015-03-09: 4 mg via INTRAVENOUS
  Filled 2015-03-09: qty 1

## 2015-03-09 NOTE — Progress Notes (Signed)
[facility] Dr. Irene Limbo due to pt complaints of abdominal pain. Pt states that she that she took morphine 1mg  po at home but still has a 6/10 pain. Obtained order for 4mg  IV morphine x1 for pain. Will reassess pain.  1230- Pain level reassessment at 2/10. Pt feels more comfortable and pain is much more tolerable at this time. Pt now infusing 1st unit of prbc out of 2units.  1550- Pt finished 2units prbc w/o any complications. Pt states that she is still feeling very tired but it could be due to her morphine medication. VS remains stable upon discharge. Printed pt avs before leaving infusion.

## 2015-03-09 NOTE — Patient Instructions (Signed)

## 2015-03-12 ENCOUNTER — Other Ambulatory Visit: Payer: Self-pay | Admitting: Nurse Practitioner

## 2015-03-12 ENCOUNTER — Telehealth: Payer: Self-pay | Admitting: Oncology

## 2015-03-12 ENCOUNTER — Telehealth: Payer: Self-pay | Admitting: *Deleted

## 2015-03-12 LAB — TYPE AND SCREEN
ABO/RH(D): O POS
Antibody Screen: NEGATIVE
UNIT DIVISION: 0
UNIT DIVISION: 0

## 2015-03-12 NOTE — Progress Notes (Signed)
[place] Work  Clinical Social Work was referred by patient for assessment of psychosocial needs in the infusion room.  Pt requested assistance with Ensure, but dietician is out today. CSW made referral to dietician and also provided emotional support to pt. Pt denied other needs, but appreciated CSW visit. Clinical Social Worker encouraged pt to reach out as needed. .    Clinical Social Work interventions: Referral to dietician Emotional support  Loren Racer, [place] Worker [place]  [facility] Phone: [phone] Fax: [phone]

## 2015-03-12 NOTE — Telephone Encounter (Signed)
Per new pof sent 2/24 pt to be seen by LT 2/28 @ 1:45 pm. Left message for patient re appointment for 2/28 to arrive 12:45 pm.

## 2015-03-12 NOTE — Telephone Encounter (Signed)
Left message for patient to call the office and give the next availible time for [place] to r/s her appts for. Per 2/27 pof

## 2015-03-12 NOTE — Telephone Encounter (Signed)
POF sent to cancel and reschedule appointment for tomorrow.

## 2015-03-12 NOTE — Telephone Encounter (Signed)
"  Let Dr. Benay Spice know I can't keep tomorrow's appointment."  Offered scheduling to reschedule.  "Have them call me to co-ordinate scheduling.  I have family coming out of town from [place]. I haven't seen in years."  Will notify providers of this call to cancel tomorrow's F/U.

## 2015-03-13 ENCOUNTER — Other Ambulatory Visit: Payer: Self-pay | Admitting: Nurse Practitioner

## 2015-03-13 ENCOUNTER — Telehealth: Payer: Self-pay | Admitting: Nutrition

## 2015-03-13 ENCOUNTER — Other Ambulatory Visit: Payer: Self-pay

## 2015-03-13 ENCOUNTER — Ambulatory Visit: Payer: Medicaid Other | Admitting: Nurse Practitioner

## 2015-03-13 ENCOUNTER — Telehealth: Payer: Self-pay | Admitting: Nurse Practitioner

## 2015-03-13 ENCOUNTER — Other Ambulatory Visit: Payer: Medicaid Other

## 2015-03-13 NOTE — Telephone Encounter (Signed)
per pof tos ch pt appt-per recent pof to move appt to 3/2 to Dr Sherrill-cld pt ansd left voicemail of appt time & date

## 2015-03-13 NOTE — Telephone Encounter (Signed)
per pof to sch pt appt-cd & left pt a message of r/e appt on 3/3@1 :15

## 2015-03-13 NOTE — Progress Notes (Signed)
Patient called and wanted to reschedule todays appts to Friday .  POF sent to schedulers.

## 2015-03-13 NOTE — Telephone Encounter (Signed)
Received referral from Education officer, museum that patient is requesting assistance with oral nutrition supplements. Contacted patient by phone and left message. Patient was last seen by this RD May 2016 and cancelled follow up appointments. Weight has decreased about 25 pounds since that time. Will await patient's return call.

## 2015-03-14 ENCOUNTER — Telehealth: Payer: Self-pay | Admitting: *Deleted

## 2015-03-14 NOTE — Telephone Encounter (Signed)
Message from pt stating she is "so sick of Dr. Benay Spice changing my appointment. I have to arrange SCAT in advance." Returned call, explained original appt was scheduled with APP for a time when MD would be out of the office. Pt agreed to keep appointment as scheduled on 3/2.

## 2015-03-15 ENCOUNTER — Other Ambulatory Visit: Payer: Self-pay

## 2015-03-15 ENCOUNTER — Other Ambulatory Visit (HOSPITAL_BASED_OUTPATIENT_CLINIC_OR_DEPARTMENT_OTHER): Payer: Medicaid Other

## 2015-03-15 ENCOUNTER — Ambulatory Visit (HOSPITAL_COMMUNITY): Payer: Medicaid Other | Attending: Internal Medicine

## 2015-03-15 ENCOUNTER — Ambulatory Visit (HOSPITAL_BASED_OUTPATIENT_CLINIC_OR_DEPARTMENT_OTHER): Payer: Medicaid Other | Admitting: Oncology

## 2015-03-15 VITALS — BP 120/73 | HR 96 | Temp 97.7°F | Resp 18 | Ht 68.0 in | Wt 173.9 lb

## 2015-03-15 DIAGNOSIS — D6481 Anemia due to antineoplastic chemotherapy: Secondary | ICD-10-CM

## 2015-03-15 DIAGNOSIS — Z029 Encounter for administrative examinations, unspecified: Secondary | ICD-10-CM | POA: Diagnosis present

## 2015-03-15 DIAGNOSIS — E876 Hypokalemia: Secondary | ICD-10-CM

## 2015-03-15 DIAGNOSIS — C49A2 Gastrointestinal stromal tumor of stomach: Secondary | ICD-10-CM | POA: Diagnosis not present

## 2015-03-15 DIAGNOSIS — D500 Iron deficiency anemia secondary to blood loss (chronic): Secondary | ICD-10-CM

## 2015-03-15 DIAGNOSIS — G893 Neoplasm related pain (acute) (chronic): Secondary | ICD-10-CM

## 2015-03-15 LAB — COMPREHENSIVE METABOLIC PANEL
ALT: 9 U/L (ref 0–55)
ANION GAP: 10 meq/L (ref 3–11)
AST: 16 U/L (ref 5–34)
Albumin: 2.4 g/dL — ABNORMAL LOW (ref 3.5–5.0)
Alkaline Phosphatase: 134 U/L (ref 40–150)
BUN: 16.9 mg/dL (ref 7.0–26.0)
CHLORIDE: 104 meq/L (ref 98–109)
CO2: 19 meq/L — AB (ref 22–29)
CREATININE: 1.8 mg/dL — AB (ref 0.6–1.1)
Calcium: 9.8 mg/dL (ref 8.4–10.4)
EGFR: 38 mL/min/{1.73_m2} — ABNORMAL LOW (ref >90)
Glucose: 111 mg/dl (ref 70–140)
POTASSIUM: 4.6 meq/L (ref 3.5–5.1)
Sodium: 133 mEq/L — ABNORMAL LOW (ref 136–145)
Total Bilirubin: 1.2 mg/dL (ref 0.20–1.20)
Total Protein: 7.5 g/dL (ref 6.4–8.3)

## 2015-03-15 LAB — CBC WITH DIFFERENTIAL/PLATELET
BASO%: 0.8 % (ref 0.0–2.0)
Basophils Absolute: 0.1 10*3/uL (ref 0.0–0.1)
EOS%: 1.4 % (ref 0.0–7.0)
Eosinophils Absolute: 0.1 10*3/uL (ref 0.0–0.5)
HCT: 27.9 % — ABNORMAL LOW (ref 34.8–46.6)
HGB: 8.7 g/dL — ABNORMAL LOW (ref 11.6–15.9)
LYMPH%: 12.2 % — AB (ref 14.0–49.7)
MCH: 28.3 pg (ref 25.1–34.0)
MCHC: 31.2 g/dL — AB (ref 31.5–36.0)
MCV: 90.7 fL (ref 79.5–101.0)
MONO#: 0.7 10*3/uL (ref 0.1–0.9)
MONO%: 9 % (ref 0.0–14.0)
NEUT#: 5.9 10*3/uL (ref 1.5–6.5)
NEUT%: 76.6 % (ref 38.4–76.8)
PLATELETS: 535 10*3/uL — AB (ref 145–400)
RBC: 3.07 10*6/uL — AB (ref 3.70–5.45)
RDW: 19.9 % — ABNORMAL HIGH (ref 11.2–14.5)
WBC: 7.6 10*3/uL (ref 3.9–10.3)
lymph#: 0.9 10*3/uL (ref 0.9–3.3)

## 2015-03-15 LAB — FECAL OCCULT BLOOD, GUAIAC: OCCULT BLOOD: NEGATIVE

## 2015-03-15 MED ORDER — MORPHINE SULFATE 15 MG PO TABS: 15.0000 mg | ORAL_TABLET | ORAL | Status: DC | PRN

## 2015-03-15 NOTE — Progress Notes (Signed)
[place] OFFICE PROGRESS NOTE   Diagnosis: Gastrointestinal stromal tumor  INTERVAL HISTORY:   She returns as scheduled. She began nilotinib last week. No diarrhea or rash. She reports stable abdominal pain, improved with morphine. She complains of "tingling" in the right greater than left leg for the past few days. No weakness. No difficulty with bowel or bladder control. Chronic back pain. She was transfused with packed red blood cells 03/09/2015.  Objective:  Vital signs in last 24 hours:  Blood pressure 120/73, pulse 96, temperature 97.7 F (36.5 C), temperature source Oral, resp. rate 18, height 5\' 8"  (1.727 m), weight 173 lb 14.4 oz (78.881 kg), last menstrual period 01/27/2015, SpO2 99 %.    HEENT: No thrush or ulcers Resp: Decreased breath sounds at the right lower chest, no respiratory distress Cardio: Regular rate and rhythm GI: Distended with firmness in the right greater than left abdomen Vascular: Trace ankle edema bilaterally Neuro: The motor exam is intact in the legs and feet bilaterally       Lab Results:  Lab Results  Component Value Date   WBC 7.6 03/15/2015   HGB 8.7* 03/15/2015   HCT 27.9* 03/15/2015   MCV 90.7 03/15/2015   PLT 535* 03/15/2015   NEUTROABS 5.9 03/15/2015  EKG-QTc 346, QTc 425   Medications: I have reviewed the patient's current medications.  Assessment/Plan: 1. Gastrointestinal stromal tumor of the stomach, high grade, stage IIIB (T4 NX), ruptured tumor, status post partial gastrectomy 09/07/2012. She began adjuvant Gleevec 10/19/2012 with a three-year course planned.  She discontinued Gleevec 06/20/2013  CT 01/09/2014 with numerous soft tissue masses filling the abdomen and pelvis consistent with recurrent tumor. Soft tissue masses caused significant narrowing of the gastric lumen. Small and large bowel loops appeared narrowed/compressed but did not appear to be obstructed. Findings suspicious for right hilar  lymphadenopathy or tumor nodularity.   Biopsy of an abdominal mass 01/11/2014 showed sheets of spindle cells with myxoid differentiation. The morphology was similar to the patient's previous gastrointestinal stromal tumor.  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  Gleevec dose increased to 400 mg daily 05/15/2014  CT scan 06/29/2014 with progressive large abdominal masses with significant mass effect on the solid abdominal organs and bowel. No bowel obstruction.  Gleevec discontinued and sunitinib initiated 37.5 mg daily following office visit 06/15/2014.  Sunitinib placed on hold 07/18/2014 due to mouth soreness and skin rash.  Sunitinib resumed at a dose of 25 mg daily on 07/31/2014  CT abdomen/pelvis 10/10/2014 showed redemonstration of massive low-density abdominal/peritoneal masses. Dominant right-sided mass slightly decreased in size. Left-sided dominant mass similar.  Sunitinib continued 25 mg daily.  Restaging CT abdomen/pelvis 01/17/2015 with progression.  Initiation of regorafenib (01/23/2015)  Cycle 2 Regorafenib 02/20/2015; patient discontinued on 02/22/2015 due to concerns medication was causing chest pain and shortness of breath. She declines to resume Regorafenib.  Initiation of Nilotinib 03/06/2015 2. Multiple sclerosis followed by Dr. Tomi Likens 3. History of anemia secondary to blood loss from #1. Persistent severe anemia. Stool negative for occult blood 05/30/2014. She was transfused 2 units of blood on 06/02/2014. Transfused 2 units of blood 02/22/2015. 4. Renal dysfunction 5. History of hypokalemia-now taking a potassium supplement 6. Pain secondary to the large right abdominal mass with displacement of the right kidney 7. Weight loss.  8. Lower extremity edema. Question related to [place]. Improved. 9. 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 Burtnett continues to be symptomatic with pain secondary to the progressive GIST. She is now in a second week of nilotinib therapy.  She will contact us for numbness or weakness of the legs. She will return for an office and lab visit in 2 weeks.  Betsy Coder, MD  03/15/2015  2:59 PM

## 2015-03-16 ENCOUNTER — Encounter: Payer: Self-pay | Admitting: Pharmacist

## 2015-03-16 ENCOUNTER — Other Ambulatory Visit: Payer: Medicaid Other

## 2015-03-16 ENCOUNTER — Ambulatory Visit: Payer: Medicaid Other | Admitting: Nurse Practitioner

## 2015-03-16 NOTE — Progress Notes (Signed)
Oral Chemotherapy Follow-Up Form  Original Start date of oral chemotherapy: _2/22/17__   Called patient today to follow up regarding patient's oral chemotherapy medication: _Tasigna_  Pt is doing well today. She still has abdominal pain from the GIST. She is doing fine with starting the Tasigna. She is taking 2 capsules twice daily on an empty stomach. She is requiring pain medication for her abdominal pain resulting in her feeling tired. No other issues or missed doses. She has been on Tasigna for a little over a week now.   Pt reports __0__ tablets/doses missed in the last week.    Pt reports the following side effects: __none (abdominal pain from GIST)_____    Will follow up and call patient again in _2 weeks____   Thank you,  Montel Clock, PharmD, [facility]

## 2015-03-20 ENCOUNTER — Other Ambulatory Visit: Payer: Self-pay | Admitting: *Deleted

## 2015-03-20 DIAGNOSIS — R634 Abnormal weight loss: Secondary | ICD-10-CM

## 2015-03-20 MED ORDER — MEGESTROL ACETATE 40 MG/ML PO SUSP: 200.0000 mg | Freq: Two times a day (BID) | ORAL | Status: AC

## 2015-03-20 NOTE — Telephone Encounter (Signed)
"  I need a refill on the medicine to help my appetite.  Can refill be sent today so it Lucianne Lei be delivered tomorrow."  This nurse will send refill.

## 2015-03-29 ENCOUNTER — Other Ambulatory Visit: Payer: Self-pay | Admitting: *Deleted

## 2015-03-29 ENCOUNTER — Other Ambulatory Visit: Payer: Medicaid Other

## 2015-03-29 ENCOUNTER — Telehealth: Payer: Self-pay | Admitting: *Deleted

## 2015-03-29 ENCOUNTER — Ambulatory Visit: Payer: Medicaid Other | Admitting: Oncology

## 2015-03-29 NOTE — Telephone Encounter (Signed)
FYI "Please tell Dr. Benay Spice I'm sorry I missed today's appointment.  I overslept.  I did not sleep well last night when I take the medicine it makes me sleepy.  I need the nurse to call me to see what appointment works." Call transferred to extension [phone].  Voicemail received with transfer.

## 2015-03-29 NOTE — Progress Notes (Signed)
Pt unable to make appt on 03/29/15 for labs and Dr. Benay Spice.  POF sent to reschedule pt as soon as possible.

## 2015-03-30 ENCOUNTER — Telehealth: Payer: Self-pay | Admitting: *Deleted

## 2015-03-30 ENCOUNTER — Telehealth: Payer: Self-pay | Admitting: Oncology

## 2015-03-30 NOTE — Telephone Encounter (Signed)
Message from pt requesting to keep 3/23 appts. She will need pain med refill that day.

## 2015-03-30 NOTE — Telephone Encounter (Signed)
Left vm to inform patient of appt times for March 23

## 2015-03-30 NOTE — Telephone Encounter (Signed)
lvm for pt regarding to March appt.... °

## 2015-03-30 NOTE — Telephone Encounter (Signed)
pt called back to r/s due to appt to [facility] gave her the first available afternoon

## 2015-04-02 ENCOUNTER — Other Ambulatory Visit: Payer: Self-pay | Admitting: *Deleted

## 2015-04-02 DIAGNOSIS — C49A2 Gastrointestinal stromal tumor of stomach: Secondary | ICD-10-CM

## 2015-04-02 MED ORDER — NILOTINIB HCL 200 MG PO CAPS: 400.0000 mg | ORAL_CAPSULE | Freq: Two times a day (BID) | ORAL | Status: DC

## 2015-04-02 MED ORDER — MORPHINE SULFATE 15 MG PO TABS: 15.0000 mg | ORAL_TABLET | ORAL | Status: DC | PRN

## 2015-04-02 NOTE — Telephone Encounter (Signed)
Received call from pt requesting refill on her appetite medicine-megace.  She request that it be sent to [facility] so they can deliver med.  Message to Dr Blinda Leatherwood

## 2015-04-02 NOTE — Telephone Encounter (Signed)
Returned call to pt, she will be out of MSIR by 3/21. Requests Rx to be available for pick up then.  She states she will keep office visit appointments on 3/23 as scheduled.

## 2015-04-02 NOTE — Telephone Encounter (Signed)
Returned call to pt, informed her there is a refill on her Megace. Narcotic pain Rx is ready for pick up. Dr. Benay Spice authorized a refill on Tasigna. She voiced understanding.

## 2015-04-02 NOTE — Telephone Encounter (Signed)
Received call from pt stating that she wants to know when her pain & tumor meds will be ready b/c she is running out before her appt on thurs.  She reports that her pain in her R abd has increased & sometimes takes more pain med than is prescribed & thinks that med needs to be increased.  She also needs tasigna before Thursday.  Message to Dr Blinda Leatherwood

## 2015-04-03 ENCOUNTER — Other Ambulatory Visit: Payer: Medicaid Other

## 2015-04-03 ENCOUNTER — Ambulatory Visit: Payer: Medicaid Other | Admitting: Oncology

## 2015-04-03 ENCOUNTER — Encounter: Payer: Self-pay | Admitting: Oncology

## 2015-04-03 NOTE — Progress Notes (Signed)
Patient called and wanted to know if we had resources available for mattresses. Advised patient we do not have any resources that may assist with that but she may be able to contact her caseworker at [facility] to see if they have any referrals that they can give. Patient states she would call. Patient has my number for any additional financial questions or concerns.

## 2015-04-05 ENCOUNTER — Other Ambulatory Visit: Payer: Medicaid Other

## 2015-04-05 ENCOUNTER — Ambulatory Visit: Payer: Medicaid Other | Admitting: Oncology

## 2015-04-05 ENCOUNTER — Telehealth: Payer: Self-pay | Admitting: Oncology

## 2015-04-05 ENCOUNTER — Telehealth: Payer: Self-pay | Admitting: *Deleted

## 2015-04-05 NOTE — Telephone Encounter (Signed)
Spoke with patient re appointment for lab/EKG/BS 3/27 @ 3 pm. Patient cannot do AM appointments - per desk nurse ok to schedule with BS 3/27 @ 4 pm.

## 2015-04-05 NOTE — Telephone Encounter (Signed)
Called pt to follow up on missed appointment. She reports she couldn't make it to appointment because she was up all night with back pain, thinks she needs a new mattress. Informed her appointments will be rescheduled.

## 2015-04-09 ENCOUNTER — Inpatient Hospital Stay (HOSPITAL_COMMUNITY)
Admission: AD | Admit: 2015-04-09 | Discharge: 2015-04-13 | DRG: 543 | Disposition: A | Discharge status: Skilled Nursing Facility | Payer: Medicaid Other | Source: Ambulatory Visit | Attending: Oncology | Admitting: Oncology

## 2015-04-09 ENCOUNTER — Ambulatory Visit (HOSPITAL_BASED_OUTPATIENT_CLINIC_OR_DEPARTMENT_OTHER): Payer: Self-pay | Admitting: Oncology

## 2015-04-09 ENCOUNTER — Ambulatory Visit (HOSPITAL_COMMUNITY)
Admission: RE | Admit: 2015-04-09 | Discharge: 2015-04-09 | Disposition: A | Discharge status: Home / Self Care | Payer: Medicaid Other | Source: Ambulatory Visit | Attending: Oncology | Admitting: Oncology

## 2015-04-09 ENCOUNTER — Other Ambulatory Visit: Payer: Self-pay

## 2015-04-09 ENCOUNTER — Other Ambulatory Visit (HOSPITAL_BASED_OUTPATIENT_CLINIC_OR_DEPARTMENT_OTHER): Payer: Medicaid Other

## 2015-04-09 ENCOUNTER — Inpatient Hospital Stay (HOSPITAL_COMMUNITY): Payer: Medicaid Other

## 2015-04-09 VITALS — BP 109/63 | HR 109 | Temp 98.1°F | Resp 18 | Ht 68.0 in | Wt 175.5 lb

## 2015-04-09 DIAGNOSIS — T451X5A Adverse effect of antineoplastic and immunosuppressive drugs, initial encounter: Secondary | ICD-10-CM | POA: Diagnosis present

## 2015-04-09 DIAGNOSIS — Z823 Family history of stroke: Secondary | ICD-10-CM | POA: Diagnosis not present

## 2015-04-09 DIAGNOSIS — E46 Unspecified protein-calorie malnutrition: Secondary | ICD-10-CM | POA: Diagnosis present

## 2015-04-09 DIAGNOSIS — Z903 Acquired absence of stomach [part of]: Secondary | ICD-10-CM

## 2015-04-09 DIAGNOSIS — Z515 Encounter for palliative care: Secondary | ICD-10-CM | POA: Diagnosis present

## 2015-04-09 DIAGNOSIS — Z87891 Personal history of nicotine dependence: Secondary | ICD-10-CM | POA: Diagnosis not present

## 2015-04-09 DIAGNOSIS — G893 Neoplasm related pain (acute) (chronic): Secondary | ICD-10-CM

## 2015-04-09 DIAGNOSIS — Z8249 Family history of ischemic heart disease and other diseases of the circulatory system: Secondary | ICD-10-CM

## 2015-04-09 DIAGNOSIS — I10 Essential (primary) hypertension: Secondary | ICD-10-CM | POA: Diagnosis present

## 2015-04-09 DIAGNOSIS — E785 Hyperlipidemia, unspecified: Secondary | ICD-10-CM | POA: Diagnosis present

## 2015-04-09 DIAGNOSIS — D638 Anemia in other chronic diseases classified elsewhere: Secondary | ICD-10-CM | POA: Diagnosis present

## 2015-04-09 DIAGNOSIS — C49A2 Gastrointestinal stromal tumor of stomach: Principal | ICD-10-CM | POA: Diagnosis present

## 2015-04-09 DIAGNOSIS — G35 Multiple sclerosis: Secondary | ICD-10-CM | POA: Diagnosis present

## 2015-04-09 DIAGNOSIS — R0600 Dyspnea, unspecified: Secondary | ICD-10-CM | POA: Diagnosis present

## 2015-04-09 DIAGNOSIS — R9431 Abnormal electrocardiogram [ECG] [EKG]: Secondary | ICD-10-CM | POA: Diagnosis not present

## 2015-04-09 DIAGNOSIS — K922 Gastrointestinal hemorrhage, unspecified: Secondary | ICD-10-CM | POA: Diagnosis present

## 2015-04-09 DIAGNOSIS — N19 Unspecified kidney failure: Secondary | ICD-10-CM | POA: Diagnosis present

## 2015-04-09 DIAGNOSIS — E860 Dehydration: Secondary | ICD-10-CM | POA: Diagnosis present

## 2015-04-09 DIAGNOSIS — R000 Tachycardia, unspecified: Secondary | ICD-10-CM | POA: Diagnosis not present

## 2015-04-09 DIAGNOSIS — D6481 Anemia due to antineoplastic chemotherapy: Secondary | ICD-10-CM | POA: Diagnosis not present

## 2015-04-09 DIAGNOSIS — R339 Retention of urine, unspecified: Secondary | ICD-10-CM | POA: Diagnosis present

## 2015-04-09 DIAGNOSIS — C787 Secondary malignant neoplasm of liver and intrahepatic bile duct: Secondary | ICD-10-CM | POA: Diagnosis present

## 2015-04-09 DIAGNOSIS — D649 Anemia, unspecified: Secondary | ICD-10-CM | POA: Diagnosis not present

## 2015-04-09 DIAGNOSIS — N289 Disorder of kidney and ureter, unspecified: Secondary | ICD-10-CM | POA: Diagnosis not present

## 2015-04-09 LAB — URINALYSIS, ROUTINE W REFLEX MICROSCOPIC
Glucose, UA: NEGATIVE mg/dL
Hgb urine dipstick: NEGATIVE
Ketones, ur: NEGATIVE mg/dL
NITRITE: POSITIVE — AB
Protein, ur: 30 mg/dL — AB
Specific Gravity, Urine: 1.027 (ref 1.005–1.030)
pH: 5.5 (ref 5.0–8.0)

## 2015-04-09 LAB — COMPREHENSIVE METABOLIC PANEL
ALBUMIN: 2.4 g/dL — AB (ref 3.5–5.0)
ALK PHOS: 137 U/L (ref 40–150)
AST: 12 U/L (ref 5–34)
Anion Gap: 9 mEq/L (ref 3–11)
BILIRUBIN TOTAL: 0.65 mg/dL (ref 0.20–1.20)
BUN: 13.4 mg/dL (ref 7.0–26.0)
CO2: 21 mEq/L — ABNORMAL LOW (ref 22–29)
Calcium: 9.3 mg/dL (ref 8.4–10.4)
Chloride: 108 mEq/L (ref 98–109)
Creatinine: 1.8 mg/dL — ABNORMAL HIGH (ref 0.6–1.1)
EGFR: 39 mL/min/{1.73_m2} — ABNORMAL LOW (ref >90)
GLUCOSE: 95 mg/dL (ref 70–140)
Potassium: 4.5 mEq/L (ref 3.5–5.1)
SODIUM: 138 meq/L (ref 136–145)
TOTAL PROTEIN: 7.4 g/dL (ref 6.4–8.3)

## 2015-04-09 LAB — CBC WITH DIFFERENTIAL/PLATELET
BASO%: 0.5 % (ref 0.0–2.0)
Basophils Absolute: 0.1 10*3/uL (ref 0.0–0.1)
EOS ABS: 0.1 10*3/uL (ref 0.0–0.5)
EOS%: 0.4 % (ref 0.0–7.0)
HCT: 22.1 % — ABNORMAL LOW (ref 34.8–46.6)
HEMOGLOBIN: 6.8 g/dL — AB (ref 11.6–15.9)
LYMPH%: 7 % — ABNORMAL LOW (ref 14.0–49.7)
MCH: 25.8 pg (ref 25.1–34.0)
MCHC: 30.7 g/dL — ABNORMAL LOW (ref 31.5–36.0)
MCV: 84.2 fL (ref 79.5–101.0)
MONO#: 0.8 10*3/uL (ref 0.1–0.9)
MONO%: 5 % (ref 0.0–14.0)
NEUT%: 87.1 % — ABNORMAL HIGH (ref 38.4–76.8)
NEUTROS ABS: 13.3 10*3/uL — AB (ref 1.5–6.5)
Platelets: 405 10*3/uL — ABNORMAL HIGH (ref 145–400)
RBC: 2.62 10*6/uL — ABNORMAL LOW (ref 3.70–5.45)
RDW: 18.6 % — AB (ref 11.2–14.5)
WBC: 15.3 10*3/uL — AB (ref 3.9–10.3)
lymph#: 1.1 10*3/uL (ref 0.9–3.3)

## 2015-04-09 LAB — URINE MICROSCOPIC-ADD ON: RBC / HPF: NONE SEEN RBC/hpf (ref 0–5)

## 2015-04-09 LAB — PREPARE RBC (CROSSMATCH)

## 2015-04-09 MED ORDER — PROMETHAZINE HCL 25 MG PO TABS
12.5000 mg | ORAL_TABLET | Freq: Four times a day (QID) | ORAL | Status: DC | PRN
  Administered 2015-04-12: 12.5 mg via ORAL
  Filled 2015-04-09: qty 1

## 2015-04-09 MED ORDER — ENOXAPARIN SODIUM 40 MG/0.4ML ~~LOC~~ SOLN
40.0000 mg | SUBCUTANEOUS | Status: DC
  Administered 2015-04-09 – 2015-04-12 (×4): 40 mg via SUBCUTANEOUS
  Filled 2015-04-09 (×4): qty 0.4

## 2015-04-09 MED ORDER — MORPHINE SULFATE 15 MG PO TABS
15.0000 mg | ORAL_TABLET | ORAL | Status: DC | PRN
  Administered 2015-04-09 – 2015-04-13 (×9): 30 mg via ORAL
  Filled 2015-04-09 (×9): qty 2

## 2015-04-09 MED ORDER — POLYETHYLENE GLYCOL 3350 17 G PO PACK
17.0000 g | PACK | Freq: Every day | ORAL | Status: DC | PRN
  Administered 2015-04-10 – 2015-04-12 (×4): 17 g via ORAL
  Filled 2015-04-09 (×4): qty 1

## 2015-04-09 MED ORDER — SODIUM CHLORIDE 0.9 % IV SOLN
INTRAVENOUS | Status: DC
  Administered 2015-04-10 – 2015-04-12 (×2): via INTRAVENOUS

## 2015-04-09 MED ORDER — SODIUM CHLORIDE 0.9 % IV SOLN
Freq: Once | INTRAVENOUS | Status: AC
  Administered 2015-04-10: 06:00:00 via INTRAVENOUS

## 2015-04-09 NOTE — H&P (Signed)
Patient History and Physical   Monica [name] [id number] Jul 11, 1965 50 y.o. 04/09/2015    Patient Identification:  50 y.o. 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 She missed several appointments over the past few weeks. She complains of exertional dyspnea , abdominal pain, and malaise. She has not taken nilotinib in several weeks. She has been unable to pick up the medication. She denies bleeding.    She presented to the office today and was noted to have severe anemia.  PMH:  Past Medical History  Diagnosis Date  . Hypertension   . Hyperlipidemia   . MS (multiple sclerosis) ([place])   .  anemia   . Dyslipidemia   . Vitamin D deficiency   . Cancer ([place])   . 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 Esophagogastroduodenoscopy N/A 09/04/2012    Procedure: ESOPHAGOGASTRODUODENOSCOPY (EGD);  Surgeon: Jerene Bears, MD;  Location: [facility];  Service: Gastroenterology;  Laterality: N/A;  . Laparotomy N/A 09/07/2012    Procedure: EXPLORATORY LAPAROTOMY;  Surgeon: Leighton Ruff, MD;  Location: [facility];  Service: General;  Laterality: N/A;  . Partial gastrectomy N/A 09/07/2012    Procedure: PARTIAL GASTRECTOMY/RESECTION OF ABDOMINAL MASS;  Surgeon: Leighton Ruff, MD;  Location: [facility];  Service: General;  Laterality: N/A;    Allergies:  No Known Allergies  Medications:   per electronic medical record Social History:    reports that she quit smoking about 13 years ago. She has never used smokeless tobacco. She reports that she does not drink alcohol or use illicit drugs. she lives alone in an apartment. She has no local family. No transportation.  Family History:  Family History  Problem Relation Age of Onset  . Stroke Mother   . Stroke Father   . Schizophrenia Brother   . Hypertension Brother   .  Hypertension Brother     Review of Systems:  Positives include: anorexia/weight loss, malaise, exertional dyspnea, abdominal pain  A complete ROS was otherwise negative.   Physical Exam:  109/63, pulse 109, respirations 18, temperature 98.1, oxygen saturation 100% on room air  HEENT:  Mild whitecoat over the tongue, no buccal thrush, no ulcers Lungs:  Decreased breath sounds at the lower posterior chest bilaterally, no respiratory distress Cardiac:  Tachycardia, regular rate and rhythm Abdomen:  Distended with masslike fullness in the midabdomen bilaterally , tender  Vascular:  1-2 plus edema at the low leg and foot bilaterally Neurologic:  Alert and oriented , follows commands, moves extremities Skin:  No rash  Lab Results:  Lab Results  Component Value Date   WBC 15.3* 04/09/2015   HGB 6.8* 04/09/2015   HCT 22.1* 04/09/2015   MCV 84.2 04/09/2015   PLT 405* 04/09/2015   NEUTROABS 13.3* 04/09/2015    BUN 13.4, creatinine 1.8, calcium 9.3, albumin 2.4   EKG: Sinus tachycardia, QT 326, QTc 424  Impression and Plan:  1.  Metastatic gastrointestinal stromal tumor, presenting with stage IIIB high-grade (T4,Nx ) tumor of the stomach , status post a partial gastrectomy 09/07/2012   Adjuvant Gleevec 10/19/2012 through 06/20/2013   Progressive disease with numerous abdominal masses beginning December 2015 -status post treatment with Gleevec, sunitinib, regorafenib with progression.   Initiation of Nilotinib 03/06/2015  2.   Multiple sclerosis  3.    Anemia secondary to chronic disease, systemic chemotherapy , malnutrition,  renal insufficiency, and potentially GI bleeding  4.    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 She is symptomatic with pain secondary to abdominal carcinomatosis. The disease has progressed despite multiple systemic regimens. Her performance status is poor. She now has severe symptomatic  anemia.  Monica [name] will be admitted for transfusion support. We will check a chest x-ray to evaluate the dyspnea an abnormal lung exam. She will be placed on Lovenox prophylaxis. She will continue narcotic analgesics for pain.   I plan to discuss Hospice care with Monica [name] during this hospital admission.     Betsy Coder, MD  04/09/2015, 4:44 PM

## 2015-04-09 NOTE — Progress Notes (Signed)
See history and physical 04/09/2015

## 2015-04-09 NOTE — Progress Notes (Signed)
Report called to [facility] RN on 3-west.  Pt escorted via w/c to 1324 by this RN.

## 2015-04-10 ENCOUNTER — Encounter (HOSPITAL_COMMUNITY): Payer: Self-pay | Admitting: *Deleted

## 2015-04-10 DIAGNOSIS — N289 Disorder of kidney and ureter, unspecified: Secondary | ICD-10-CM

## 2015-04-10 LAB — CBC
HEMATOCRIT: 24.3 % — AB (ref 36.0–46.0)
Hemoglobin: 7.9 g/dL — ABNORMAL LOW (ref 12.0–15.0)
MCH: 27.5 pg (ref 26.0–34.0)
MCHC: 32.5 g/dL (ref 30.0–36.0)
MCV: 84.7 fL (ref 78.0–100.0)
PLATELETS: 377 10*3/uL (ref 150–400)
RBC: 2.87 MIL/uL — ABNORMAL LOW (ref 3.87–5.11)
RDW: 17 % — AB (ref 11.5–15.5)
WBC: 15 10*3/uL — AB (ref 4.0–10.5)

## 2015-04-10 LAB — FERRITIN: Ferritin: 1064 ng/mL — ABNORMAL HIGH (ref 11–307)

## 2015-04-10 MED ORDER — MORPHINE SULFATE 2 MG/ML IJ SOLN: 2.0000 mg | INTRAMUSCULAR | Status: DC | PRN

## 2015-04-10 MED ORDER — MORPHINE SULFATE (PF) 2 MG/ML IV SOLN
2.0000 mg | INTRAVENOUS | Status: DC | PRN
  Administered 2015-04-10 – 2015-04-12 (×9): 2 mg via INTRAVENOUS
  Filled 2015-04-10 (×9): qty 1

## 2015-04-10 NOTE — Progress Notes (Signed)
IP PROGRESS NOTE  Subjective:   She complains of persistent abdomen and back pain. She was transfused red blood cells last night.  Objective: Vital signs in last 24 hours: Blood pressure 101/60, pulse 96, temperature 98.5 F (36.9 C), temperature source Oral, resp. rate 20, height 5\' 8"  (1.727 m), weight 176 lb 3.2 oz (79.924 kg), SpO2 98 %.  Intake/Output from previous day: 03/27 0701 - 03/28 0700 In: 894 [P.O.:240; Blood:654] Out: 140 [Urine:140]  Physical Exam:  HEENT: No thrush Lungs: Decreased breath sounds at the lower chest bilaterally, no respiratory distress Cardiac: Regular rate and rhythm Abdomen: Distended with masslike fullness in the left and right abdomen Extremities: 1+ low leg edema bilaterally    Lab Results:  Recent Labs  04/09/15 1459  WBC 15.3*  HGB 6.8*  HCT 22.1*  PLT 405*    BMET  Recent Labs  04/09/15 1501  NA 138  K 4.5  CO2 21*  GLUCOSE 95  BUN 13.4  CREATININE 1.8*  CALCIUM 9.3    Studies/Results: X-ray Chest Pa And Lateral  04/09/2015  CLINICAL DATA:  Exertional dyspnea, abdominal pain, malaise, multiple sclerosis, GIST, hypertension, former smoker EXAM: CHEST  2 VIEW COMPARISON:  10/12/2015 FINDINGS: Low lung volumes. Enlargement of cardiac silhouette likely accentuated by AP technique and hypoinflation. Mediastinal contours and pulmonary vascularity normal. Bibasilar atelectasis. No gross infiltrate, pleural effusion or pneumothorax. IMPRESSION: Low lung volumes with bibasilar atelectasis. Electronically Signed   By: Lavonia Dana M.D.   On: 04/09/2015 20:34    Medications: I have reviewed the patient's current medications.  Assessment/Plan: 1.  Metastatic gastrointestinal stromal tumor, presenting with stage IIIB high-grade (T4,Nx ) tumor of the stomach , status post a partial gastrectomy 09/07/2012  Adjuvant Gleevec 10/19/2012 through 06/20/2013  Progressive disease with numerous abdominal masses beginning December 2015  -status post treatment with Gleevec, sunitinib, regorafenib with progression.  Initiation of Nilotinib 03/06/2015  2. Multiple sclerosis  3. Anemia secondary to chronic disease, systemic chemotherapy , malnutrition, renal insufficiency, and potentially GI bleeding  4. Renal insufficiency  5.    Pain secondary to #1  Ms. Gatchell has an advanced metastatic gastrointestinal stromal tumor. She has been treated with multiple systemic therapies without clinical improvement. I recommend Hospice care. She is in agreement.  She lives alone and has no family in the area. She will likely need placement with Hospice support.  We discussed CPR and ACLS issues. She would like to remain on a full CODE STATUS.  Plan: 1. Continue narcotic analgesics for pain 2. [facility] hospice referral and social work consult     LOS: 1 day   Betsy Coder, MD   04/10/2015, 8:23 AM

## 2015-04-10 NOTE — Progress Notes (Signed)
Notified Dr. Gearldine Shown nurse Kenney Houseman of patients lab results for Ferritin and Hgb.

## 2015-04-10 NOTE — Progress Notes (Signed)
Completed in-n-out cath on patient per MD. Order. Obtained 75cc, bladder scan still showing excess of 430cc. MD. Lindi Adie notified.

## 2015-04-10 NOTE — Progress Notes (Signed)
Patient voided 75cc. Post void residual was 648cc. MD. Lindi Adie notified. Ordered in-n-out cath.

## 2015-04-11 DIAGNOSIS — R339 Retention of urine, unspecified: Secondary | ICD-10-CM

## 2015-04-11 LAB — TYPE AND SCREEN
ABO/RH(D): O POS
Antibody Screen: NEGATIVE
UNIT DIVISION: 0
UNIT DIVISION: 0

## 2015-04-11 MED ORDER — LIDOCAINE HCL 2 % EX GEL
1.0000 "application " | Freq: Once | CUTANEOUS | Status: AC
  Administered 2015-04-11: 1 via URETHRAL
  Filled 2015-04-11: qty 11

## 2015-04-11 MED ORDER — MORPHINE SULFATE ER 30 MG PO TBCR
30.0000 mg | EXTENDED_RELEASE_TABLET | Freq: Two times a day (BID) | ORAL | Status: DC
  Administered 2015-04-11 – 2015-04-13 (×5): 30 mg via ORAL
  Filled 2015-04-11 (×6): qty 1

## 2015-04-11 NOTE — Progress Notes (Signed)
IP PROGRESS NOTE  Subjective:   She continues to have abdominal pain. She had difficulty urinating yesterday and had a large residual on bladder scan. An in and out cath Obtained only 75 mL. A Foley catheter was placed today.  Objective: Vital signs in last 24 hours: Blood pressure 98/59, pulse 91, temperature 97.2 F (36.2 C), temperature source Oral, resp. rate 18, height 5\' 8"  (1.727 m), weight 176 lb 3.2 oz (79.924 kg), SpO2 99 %.  Intake/Output from previous day: 03/28 0701 - 03/29 0700 In: 800 [P.O.:720; I.V.:80] Out: 400 [Urine:400]  Physical Exam:  HEENT: No thrush Lungs: Clear anteriorly Cardiac: Regular rate and rhythm Abdomen: Distended with masslike fullness in the left and right abdomen Extremities: Trace low leg edema bilaterally Neurologic: Good leg and foot strength bilaterally    Lab Results:  Recent Labs  04/09/15 1459 04/10/15 0912  WBC 15.3* 15.0*  HGB 6.8* 7.9*  HCT 22.1* 24.3*  PLT 405* 377    BMET  Recent Labs  04/09/15 1501  NA 138  K 4.5  CO2 21*  GLUCOSE 95  BUN 13.4  CREATININE 1.8*  CALCIUM 9.3    Studies/Results: X-ray Chest Pa And Lateral  04/09/2015  CLINICAL DATA:  Exertional dyspnea, abdominal pain, malaise, multiple sclerosis, GIST, hypertension, former smoker EXAM: CHEST  2 VIEW COMPARISON:  10/12/2015 FINDINGS: Low lung volumes. Enlargement of cardiac silhouette likely accentuated by AP technique and hypoinflation. Mediastinal contours and pulmonary vascularity normal. Bibasilar atelectasis. No gross infiltrate, pleural effusion or pneumothorax. IMPRESSION: Low lung volumes with bibasilar atelectasis. Electronically Signed   By: Lavonia Dana M.D.   On: 04/09/2015 20:34    Medications: I have reviewed the patient's current medications.  Assessment/Plan: 1.  Metastatic gastrointestinal stromal tumor, presenting with stage IIIB high-grade (T4,Nx ) tumor of the stomach , status post a partial gastrectomy  09/07/2012  Adjuvant Gleevec 10/19/2012 through 06/20/2013  Progressive disease with numerous abdominal masses beginning December 2015 -status post treatment with Gleevec, sunitinib, regorafenib with progression.  Initiation of Nilotinib 03/06/2015  2. Multiple sclerosis  3. Anemia secondary to chronic disease, systemic chemotherapy , malnutrition, renal insufficiency, and potentially GI bleeding  4. Renal insufficiency  5.    Pain secondary to #1  6.    Urinary retention-most likely related to tumor compressing the bladder, we will leave the Foley catheter in place for now. I have a low clinical suspicion for a neurogenic bladder.  Ms. Kielty appears unchanged. She continues to have pain. We we will add MS Contin. She agrees to nursing facility placement.  Plan: 1. Continue narcotic analgesics for pain, add MS Contin 2. [facility] hospice referral and social work consult for placement with Hospice 3. Foley catheter to remain in place for now     LOS: 2 days   Betsy Coder, MD   04/11/2015, 10:56 AM

## 2015-04-11 NOTE — Evaluation (Signed)
Physical Therapy Evaluation Patient Details Name: Monica [name] MRN: [id number] DOB: [birth year] Today's Date: 04/11/2015   History of Present Illness  50 y.o. female with h/o gastric tumor s/p partial gastrectomy and MS  admitted with dyspnea on exertition, abdominal pain, malaise. Dx of urinary retention, anemia, renal insufficiency.   Clinical Impression  Pt admitted with above diagnosis. Pt currently with functional limitations due to the deficits listed below (see PT Problem List). Min/guard for safety, pt ambulated 200' holding IV pole, with 2/4 dyspnea, SaO2 98% on RA.  Pt will benefit from skilled PT to increase their independence and safety with mobility to allow discharge to the venue listed below.      Follow Up Recommendations SNF;Supervision for mobility/OOB    Equipment Recommendations  Rolling walker with 5" wheels    Recommendations for Other Services       Precautions / Restrictions Precautions Precautions: Fall Precaution Comments: pt denies h/o falls Restrictions Weight Bearing Restrictions: No      Mobility  Bed Mobility Overal bed mobility: Modified Independent                Transfers Overall transfer level: Modified independent Equipment used: None                Ambulation/Gait Ambulation/Gait assistance: Min guard Ambulation Distance (Feet): 200 Feet Assistive device:  (IV pole) Gait Pattern/deviations: Step-through pattern   Gait velocity interpretation: Below normal speed for age/gender General Gait Details: 2/4 dyspnea, SaO2 98% walking, distance limited by fatigue, declined RW, min/guard for balance/safety  Stairs            Wheelchair Mobility    Modified Rankin (Stroke Patients Only)       Balance Overall balance assessment: Modified Independent         Standing balance support: Bilateral upper extremity supported Standing balance-Leahy Scale: Fair Standing balance comment: relies on BUE support                              Pertinent Vitals/Pain Pain Assessment: No/denies pain    Home Living Family/patient expects to be discharged to:: Private residence Living Arrangements: Alone   Type of Home: Apartment Home Access: Level entry     Home Layout: One level Home Equipment: None      Prior Function Level of Independence: Independent         Comments: walks short distances due to  SOB, denies falls at home     Hand Dominance        Extremity/Trunk Assessment   Upper Extremity Assessment: Overall WFL for tasks assessed           Lower Extremity Assessment: Generalized weakness (grossly 4/5, edema BLEs)      Cervical / Trunk Assessment: Normal  Communication   Communication: No difficulties  Cognition Arousal/Alertness: Awake/alert Behavior During Therapy: WFL for tasks assessed/performed Overall Cognitive Status: Within Functional Limits for tasks assessed                      General Comments      Exercises        Assessment/Plan    PT Assessment Patient needs continued PT services  PT Diagnosis Generalized weakness;Difficulty walking   PT Problem List Decreased strength;Decreased activity tolerance;Decreased balance  PT Treatment Interventions Gait training;Functional mobility training;Therapeutic activities;Patient/family education;Balance training;Therapeutic exercise   PT Goals (Current goals can be found in the Care Plan section) Acute  Rehab PT Goals Patient Stated Goal: get stronger PT Goal Formulation: With patient Time For Goal Achievement: 04/25/15 Potential to Achieve Goals: Fair    Frequency Min 3X/week   Barriers to discharge Decreased caregiver support      Co-evaluation               End of Session Equipment Utilized During Treatment: Gait belt Activity Tolerance: Patient limited by fatigue Patient left: in chair;with call bell/phone within reach;with chair alarm set Nurse Communication: Mobility  status         Time: [id number] PT Time Calculation (min) (ACUTE ONLY): 23 min   Charges:   PT Evaluation $PT Eval Low Complexity: 1 Procedure PT Treatments $Gait Training: 8-22 mins   PT G Codes:        Philomena Doheny 04/11/2015, 1:07 PM [phone]

## 2015-04-11 NOTE — NC FL2 (Signed)
[place] LEVEL OF CARE SCREENING TOOL     IDENTIFICATION  Patient Name: Monica [name] Birthdate: [birth year] Sex: female Admission Date (Current Location): 04/09/2015  [place] and [place] Number:  Kathleen Argue [id number] [place] and Address:  [facility],  [address] [place], [place]      Provider Number: [id number]  Attending Physician Name and Address:  Ladell Pier, MD  Relative Name and Phone Number:       Current Level of Care: Hospital Recommended Level of Care: [place] Prior Approval Number:    Date Approved/Denied:   PASRR Number: [id number]  Discharge Plan: SNF    Current Diagnoses: Patient Active Problem List   Diagnosis Date Noted  . GIST, malignant ([place])   . UTI (lower urinary tract infection)   . Acute renal failure ([place]) 01/09/2014  . Abdominal distention   . Multiple sclerosis ([place]) 12/30/2012  . 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 Gastrointestinal stromal tumor (GIST) of stomach 09/07/2012  . Hypokalemia 09/06/2012  . Protein-calorie malnutrition, severe ([place]) 09/05/2012  . Syncope 09/04/2012  . GI bleed 09/04/2012  . History of multiple sclerosis 09/04/2012  . HTN (hypertension) 09/04/2012  . Hyperlipidemia 09/04/2012  . Acute blood loss anemia 09/04/2012  . Submucosal lesion of stomach 09/04/2012    Orientation RESPIRATION BLADDER Height & Weight     Self, Time, Situation, Place  Normal Continent (pt up & down to bedside commode often due to tumor putting pressure on bladder) Weight: 176 lb 3.2 oz (79.924 kg) Height:  5\' 8"  (172.7 cm)  BEHAVIORAL SYMPTOMS/MOOD NEUROLOGICAL BOWEL NUTRITION STATUS   (no behaviors)  (NONE) Continent Diet (Diet Regular)  AMBULATORY STATUS COMMUNICATION OF NEEDS Skin   Limited Assist (ADL's limited to pain secondary to Metastatic gastrointestinal stromal tumor, presenting with stage IIIB high-grade (T4,Nx ) tumor of the stomach ) Verbally Normal                      Personal Care Assistance Level of Assistance  Bathing, Feeding, Dressing Bathing Assistance: Limited assistance Feeding assistance: Independent Dressing Assistance: Limited assistance     Functional Limitations Info  Sight, Hearing, Speech Sight Info: Adequate Hearing Info: Adequate Speech Info: Adequate    SPECIAL CARE FACTORS FREQUENCY  PT (By licensed PT)     PT Frequency: 3 x a week              Contractures Contractures Info: Not present    Additional Factors Info  Code Status Code Status Info: FULL code status Allergies Info: No Known Allergies           Current Medications (04/11/2015):  This is the current hospital active medication list Current Facility-Administered Medications  Medication Dose Route Frequency Provider Last Rate Last Dose  . 0.9 %  sodium chloride infusion   Intravenous Continuous Owens Shark, NP 10 mL/hr at 04/11/15 0801    . enoxaparin (LOVENOX) injection 40 mg  40 mg Subcutaneous Q24H Owens Shark, NP   40 mg at 04/10/15 2106  . morphine (MS CONTIN) 12 hr tablet 30 mg  30 mg Oral Q12H Ladell Pier, MD      . morphine (MSIR) tablet 15-30 mg  15-30 mg Oral Q4H PRN Owens Shark, NP   30 mg at 04/10/15 2106  . morphine 2 MG/ML injection 2-4 mg  2-4 mg Intravenous Q2H PRN Ladell Pier, MD   2 mg at 04/11/15 1116  . polyethylene glycol (  MIRALAX / GLYCOLAX) packet 17 g  17 g Oral Daily PRN Owens Shark, NP   17 g at 04/11/15 [id number]  . promethazine (PHENERGAN) tablet 12.5 mg  12.5 mg Oral Q6H PRN Owens Shark, NP         Discharge Medications: Please see discharge summary for a list of discharge medications.  Relevant Imaging Results:  Relevant Lab Results:   Additional Information SSN: [id number] Oncologist recommends [facility] hospice referral from SNF.   Zeidy Tayag A, LCSW

## 2015-04-11 NOTE — Progress Notes (Signed)
CSW received consult for New SNF.  CSW met with pt at bedside to discuss. CSW discussed process of SNF placement and Medicaid/Disability coverage at SNF.   Pt agreeable to initiation of SNF search, but concerned about likely having to sign disability check over to facility for care.   CSW to follow up with pt to provide bed offers and further discuss decisions around SNF.   Full psychosocial assessment to follow.  Alison Murray, MSW, [place] [facility] [phone]

## 2015-04-11 NOTE — Progress Notes (Signed)
Bladder scan post void reveals between 194-361ml in bladder. Pt's abdomen is so distended it is difficult to gauge if scanned fluid is urine or ascites from abdomen.  Pt at this time is adamantly refusing a foley or in and out cath. Will notify Dr Benay Spice. Urine cx has been sent.

## 2015-04-11 NOTE — Progress Notes (Signed)
Report from Romie Minus, [place], at [phone]. Care assumed for pt at that time. Pt presently assisted to [facility]. Voided ~12ml of concentrated, tea colored urine. Pt feels as though she has emptied her bladder completely. Has just received pain med from prior shift RN. Assessment unchanged from AM assessment.

## 2015-04-11 NOTE — Progress Notes (Signed)
Dr Benay Spice aware of pt's U.O. New orders for AM labs and to increase IVF. Will implement and monitor output.

## 2015-04-11 NOTE — Clinical Social Work Placement (Signed)
   CLINICAL SOCIAL WORK PLACEMENT  NOTE  Date:  04/11/2015  Patient Details  Name: Monica [name] MRN: [id number] Date of Birth: 1965-07-21  Clinical Social Work is seeking post-discharge placement for this patient at the [place] level of care (*CSW will initial, date and re-position this form in  chart as items are completed):  Yes   Patient/family provided with [place] Work Department's list of facilities offering this level of care within the geographic area requested by the patient (or if unable, by the patient's family).  Yes   Patient/family informed of their freedom to choose among providers that offer the needed level of care, that participate in Medicare, Medicaid or managed care program needed by the patient, have an available bed and are willing to accept the patient.  Yes   Patient/family informed of [facility]'s ownership interest in [facility] and [facility], as well as of the fact that they are under no obligation to receive care at these facilities.  PASRR submitted to EDS on 04/11/15     PASRR number received on 04/11/15     Existing PASRR number confirmed on       FL2 transmitted to all facilities in geographic area requested by pt/family on 04/11/15     FL2 transmitted to all facilities within larger geographic area on       Patient informed that his/her managed care company has contracts with or will negotiate with certain facilities, including the following:            Patient/family informed of bed offers received.  Patient chooses bed at       Physician recommends and patient chooses bed at      Patient to be transferred to   on  .  Patient to be transferred to facility by       Patient family notified on   of transfer.  Name of family member notified:        PHYSICIAN Please sign FL2     Additional Comment:    _______________________________________________ Ladell Pier, LCSW 04/11/2015, 12:03  PM

## 2015-04-12 ENCOUNTER — Telehealth: Payer: Self-pay | Admitting: Pharmacist

## 2015-04-12 ENCOUNTER — Telehealth: Payer: Self-pay | Admitting: *Deleted

## 2015-04-12 DIAGNOSIS — R300 Dysuria: Secondary | ICD-10-CM

## 2015-04-12 DIAGNOSIS — E860 Dehydration: Secondary | ICD-10-CM

## 2015-04-12 LAB — BASIC METABOLIC PANEL
Anion gap: 9 (ref 5–15)
BUN: 19 mg/dL (ref 6–20)
CO2: 19 mmol/L — AB (ref 22–32)
Calcium: 9 mg/dL (ref 8.9–10.3)
Chloride: 111 mmol/L (ref 101–111)
Creatinine, Ser: 1.66 mg/dL — ABNORMAL HIGH (ref 0.44–1.00)
GFR calc Af Amer: 41 mL/min — ABNORMAL LOW (ref >60)
GFR, EST NON AFRICAN AMERICAN: 36 mL/min — AB (ref >60)
GLUCOSE: 97 mg/dL (ref 65–99)
POTASSIUM: 4.4 mmol/L (ref 3.5–5.1)
Sodium: 139 mmol/L (ref 135–145)

## 2015-04-12 LAB — URINE CULTURE: Culture: 4000

## 2015-04-12 NOTE — Progress Notes (Signed)
IP PROGRESS NOTE  Subjective:   She complains of abdominal distention and pain in the right upper abdomen. She request surgical removal of the right abdominal mass.  Objective: Vital signs in last 24 hours: Blood pressure 102/55, pulse 86, temperature 98.1 F (36.7 C), temperature source Oral, resp. rate 18, height 5\' 8"  (1.727 m), weight 176 lb 3.2 oz (79.924 kg), SpO2 98 %.  Intake/Output from previous day: 03/29 0701 - 03/30 0700 In: 720 [P.O.:600; I.V.:120] Out: 505 [Urine:505]  Physical Exam:  HEENT: No thrush Lungs: Decreased breath sounds with rhonchi at the posterior chest bilaterally, no respiratory distress Cardiac: Regular rate and rhythm Abdomen: Distended with masslike fullness in the left and right abdomen Extremities: Trace low leg edema bilaterally     Lab Results:  Recent Labs  04/09/15 1459 04/10/15 0912  WBC 15.3* 15.0*  HGB 6.8* 7.9*  HCT 22.1* 24.3*  PLT 405* 377    BMET  Recent Labs  04/09/15 1501 04/12/15 0326  NA 138 139  K 4.5 4.4  CL  --  111  CO2 21* 19*  GLUCOSE 95 97  BUN 13.4 19  CREATININE 1.8* 1.66*  CALCIUM 9.3 9.0     Medications: I have reviewed the patient's current medications.  Assessment/Plan: 1.  Metastatic gastrointestinal stromal tumor, presenting with stage IIIB high-grade (T4,Nx ) tumor of the stomach , status post a partial gastrectomy 09/07/2012  Adjuvant Gleevec 10/19/2012 through 06/20/2013  Progressive disease with numerous abdominal masses beginning December 2015 -status post treatment with Gleevec, sunitinib, regorafenib with progression.  Initiation of Nilotinib 03/06/2015  2. Multiple sclerosis  3. Anemia secondary to chronic disease, systemic chemotherapy , malnutrition, renal insufficiency, and potentially GI bleeding  4. Renal insufficiency  5.    Pain secondary to #1  6.    Decreased urine output- likely secondary to dehydration and renal failure  Ms. Bohlman appears  unchanged. She agrees to placement in a skilled nursing facility. She is working with Social work on placement.  I discussed treatment options for the metastatic GIST with her again this morning. I explained surgery is not an option. I recommend comfort care. She would like relief of the abdominal distention. I doubt there is a significant amount of ascites. She would like to discuss surgical options with Dr. Marcello Moores. I contacted Dr., since she agrees to see Ms. Pandolfi.  Plan: 1. Continue MS Contin/morphine for pain 2. Skilled nursing facility placement 3. [facility] referral     LOS: 3 days   Betsy Coder, MD   04/12/2015, 8:30 AM

## 2015-04-12 NOTE — Progress Notes (Signed)
CSW continuing to follow.   CSW followed up with pt at bedside regarding SNF bed offer. Only offers in [facility] are [employer] and [place] and [facility] and [place]. Pt agreeable to facility representative coming to speak to her more about bed offers and Medicaid/disability coverage. Pt expressed being in pain at this time and notified RN. Pt agreed to considering options and CSW following up regarding decision. CSW discussed oncologist recommendation for [facility] referral for hospice to follow at SNF and pt expressed that Dr. Benay Spice had discussed this with her and agreeable to referral.   CSW notified [facility] liaison that [place] assisting with disposition to SNF with hospice services and bed offers provided and CSW will follow up re: decision between [employer] and [place] and [employer] [facility] and [place].  CSW spoke with [place] and [facility] hospital liaison, Joaquim Nam who will meet with pt this morning.   Pt not yet medically ready for discharge.  CSW to continue to follow to provide support and assist with pt disposition planning.  Alison Murray, MSW, [place] Work [phone]

## 2015-04-12 NOTE — Progress Notes (Signed)
[facility] [place] Work   Received notification from [employer] of potential transfer to SNF with hospice services. Chart reviewed. Note Dr. Gearldine Shown involvement. Clinical information being reviewed and will engage with patient after further discussion with CSW.   Thank you. Erling Conte, [place]

## 2015-04-12 NOTE — Progress Notes (Signed)
CSW continuing to follow.   CSW followed up with pt at bedside this afternoon.   Pt states that she was able to meet with facility representative, Joaquim Nam and pt chooses [facility].   CSW spoke with [facility] representative, Joaquim Nam who confirmed pt has a bed available at [facility] and pt can have hospice to follow at [facility].   CSW confirmed with Dr. Benay Spice RN that pt oral chemotherapy will be discontinued.   Referral made to [facility] for hospice to follow at [facility] and Rehab.  CSW to continue to follow to assist with pt discharge to [facility] and Rehab when medically ready for discharge.  Alison Murray, MSW, [place] Work [phone]

## 2015-04-12 NOTE — Telephone Encounter (Signed)
Return call placed to [facility] to inform her that per Dr. Benay Spice, pt will not continue on oral chemotherapy at the time of her discharge to a skilled nursing center.

## 2015-04-12 NOTE — Clinical Social Work Note (Signed)
Clinical Social Work Assessment  Patient Details  Name: Monica [name] MRN: [id number] Date of Birth: [birth year]  Date of referral:  04/11/15               Reason for consult:  Discharge Planning                Permission sought to share information with:    Permission granted to share information::  No  Name::        Agency::     Relationship::     Contact Information:     Housing/Transportation Living arrangements for the past 2 months:  Apartment Source of Information:  Patient Patient Interpreter Needed:  None Criminal Activity/Legal Involvement Pertinent to Current Situation/Hospitalization:  No - Comment as needed Significant Relationships:  Adult Children (pt son resides in a group home, pt does not have any other local support) Lives with:  Self Do you feel safe going back to the place where you live?  No Need for family participation in patient care:  No (Coment)  Care giving concerns: Pt admitted from home alone. Pt lacks support system. Pt ADL's limited to pain due to tumor. Oncologist recommending SNF.    Social Worker assessment / plan:    CSW received consult for New SNF.  CSW met with pt at bedside to discuss. CSW discussed process of SNF placement and Medicaid/Disability coverage at SNF. CSW clarified pt questions and concerns. Pt expressed that she is agreeable to initiation of SNF search, but concerned about likely having to sign disability check over to facility for care as she has an apartment to maintain. CSW offered to have a facility liaison come to speak with pt regarding requirements of signing over pt disability check and pt agreeable. CSW discussed that Medicaid only being only payer can limit options of facilities and pt expressed understanding.  CSW completed FL2 and initiated SNF search. CSW notified Gayla Medicus, nurse liaison with [facility] that pt is agreeable to visit to discuss disability/Medicaid coverage at [facility].  Oncologist recommends hospice support at facility and CSW included in referral that hospice will need to be consulted from SNF. CSW to follow up with pt to provide bed offers and further discuss decisions around SNF. CSW to continue to follow to provide support and assist with pt disposition planning.    Employment status:  Disabled (Comment on whether or not currently receiving Disability) Insurance information:  [place] PT Recommendations:  [place] / Referral to community resources:  [place]  Patient/Family's Response to care:  Pt alert and oriented x 4. Pt calmly discussed SNF, but expressed anxiousness around signing over disability check. Pt agreeable to explore options.   Patient/Family's Understanding of and Emotional Response to Diagnosis, Current Treatment, and Prognosis:  Pt expressed her main concern at home right now is that she is not able to tolerate standing to be able to cook and clean her home and care for herself.   Emotional Assessment Appearance:  Appears stated age Attitude/Demeanor/Rapport:  Other (appropriate) Affect (typically observed):  Appropriate Orientation:  Oriented to Self, Oriented to Place, Oriented to  Time, Oriented to Situation Alcohol / Substance use:  Not Applicable Psych involvement (Current and /or in the community):  No (Comment)  Discharge Needs  Concerns to be addressed:  Lack of Support, Discharge Planning Concerns Readmission within the last 30 days:  No Current discharge risk:  Physical Impairment Barriers to Discharge:  Continued Medical Work up (pain management)   [place], [place], LCSW 04/12/2015, 10:34 AM  [phone]

## 2015-04-12 NOTE — Clinical Social Work Placement (Signed)
   CLINICAL SOCIAL WORK PLACEMENT  NOTE  Date:  04/12/2015  Patient Details  Name: Monica [name] MRN: [id number] Date of Birth: Jan 12, 1966  Clinical Social Work is seeking post-discharge placement for this patient at the [place] level of care (*CSW will initial, date and re-position this form in  chart as items are completed):  Yes   Patient/family provided with [place] Work Department's list of facilities offering this level of care within the geographic area requested by the patient (or if unable, by the patient's family).  Yes   Patient/family informed of their freedom to choose among providers that offer the needed level of care, that participate in Medicare, Medicaid or managed care program needed by the patient, have an available bed and are willing to accept the patient.  Yes   Patient/family informed of [place]'s ownership interest in [facility] and [facility], as well as of the fact that they are under no obligation to receive care at these facilities.  PASRR submitted to EDS on 04/11/15     PASRR number received on 04/11/15     Existing PASRR number confirmed on       FL2 transmitted to all facilities in geographic area requested by pt/family on 04/11/15     FL2 transmitted to all facilities within larger geographic area on       Patient informed that his/her managed care company has contracts with or will negotiate with certain facilities, including the following:        Yes   Patient/family informed of bed offers received.  Patient chooses bed at       Physician recommends and patient chooses bed at      Patient to be transferred to   on  .  Patient to be transferred to facility by       Patient family notified on   of transfer.  Name of family member notified:        PHYSICIAN Please sign FL2     Additional Comment:    _______________________________________________ Ladell Pier, LCSW 04/12/2015,  11:08 AM

## 2015-04-12 NOTE — Telephone Encounter (Signed)
"  This is [place] Social Worker calling for clarification of oral chemotherapy for this patient.  Plan to discharge to skilled nursing care with Hospice support.  Not sure she will qualify if she is to continue oral chemotherapy.  Nilotinib initiated 03-06-2015 per notes with no mention of continuing or not.  Recommend comfort careat skilled facility.  Please call [phone]."

## 2015-04-12 NOTE — Consult Note (Signed)
Reason for Consult:eval for surgical management of metastatic GIST Referring Physician: Dr Newt Lukes is an 50 y.o. female.  HPI: 50 y.o. F s/p resection of perforated GIST ~2 yrs ago.  Unfortunately, she has no developed widely metastatic disease in her abdomen that is not responsive to multiple chemotherapy options.  I was asked by Dr Benay Spice to discuss any surgical debulking procedures that may help alleviate her pain.     Past Medical History  Diagnosis Date  . Hypertension   . Hyperlipidemia   . MS (multiple sclerosis) ([place])   . Low back pain   . Dyslipidemia   . Vitamin D deficiency   . Cancer ([place])   . 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 Esophagogastroduodenoscopy N/A 09/04/2012    Procedure: ESOPHAGOGASTRODUODENOSCOPY (EGD);  Surgeon: Jerene Bears, MD;  Location: [facility];  Service: Gastroenterology;  Laterality: N/A;  . Laparotomy N/A 09/07/2012    Procedure: EXPLORATORY LAPAROTOMY;  Surgeon: Leighton Ruff, MD;  Location: [facility];  Service: General;  Laterality: N/A;  . Partial gastrectomy N/A 09/07/2012    Procedure: PARTIAL GASTRECTOMY/RESECTION OF ABDOMINAL MASS;  Surgeon: Leighton Ruff, MD;  Location: [facility];  Service: General;  Laterality: N/A;    Family History  Problem Relation Age of Onset  . Stroke Mother   . Stroke Father   . Schizophrenia Brother   . Hypertension Brother   . Hypertension Brother     Social History:  reports that she quit smoking about 13 years ago. She has never used smokeless tobacco. She reports that she does not drink alcohol or use illicit drugs.  Allergies: No Known Allergies  Medications: I have reviewed the patient's current medications.  Results for orders placed or performed during the hospital encounter of 04/09/15 (from the past 48 hour(s))  Basic metabolic panel     Status: Abnormal   Collection Time: 04/12/15  3:26 AM  Result Value  Ref Range   Sodium 139 135 - 145 mmol/L   Potassium 4.4 3.5 - 5.1 mmol/L   Chloride 111 101 - 111 mmol/L   CO2 19 (L) 22 - 32 mmol/L   Glucose, Bld 97 65 - 99 mg/dL   BUN 19 6 - 20 mg/dL   Creatinine, Ser 1.66 (H) 0.44 - 1.00 mg/dL   Calcium 9.0 8.9 - 10.3 mg/dL   GFR calc non Af Amer 36 (L) >60 mL/min   GFR calc Af Amer 41 (L) >60 mL/min    Comment: (NOTE) The eGFR has been calculated using the CKD EPI equation. This calculation has not been validated in all clinical situations. eGFR's persistently <60 mL/min signify possible Chronic Kidney Disease.    Anion gap 9 5 - 15    No results found.  Review of Systems  Constitutional: Negative for fever and chills.  Eyes: Negative for blurred vision and double vision.  Respiratory: Negative for cough and shortness of breath.   Cardiovascular: Negative for chest pain.  Gastrointestinal: Positive for heartburn and abdominal pain. Negative for nausea and vomiting.  Genitourinary: Negative for dysuria, urgency and frequency.  Skin: Negative for rash.  Neurological: Negative for dizziness and headaches.   Blood pressure 102/64, pulse 88, temperature 98.3 F (36.8 C), temperature source Oral, resp. rate 20, height 5' 8"  (1.727 m), weight 79.924 kg (176 lb 3.2 oz), SpO2 98 %. Physical Exam  Constitutional: She is oriented to person, place, and time. She appears  well-developed and well-nourished. No distress.  Eyes: Conjunctivae are normal. Pupils are equal, round, and reactive to light.  Neck: Normal range of motion. Neck supple.  Cardiovascular: Normal rate and regular rhythm.   Respiratory: Effort normal and breath sounds normal. No respiratory distress.  GI: Soft. She exhibits distension and mass. There is tenderness. There is no rebound and no guarding.  Musculoskeletal: Normal range of motion.  Neurological: She is alert and oriented to person, place, and time.  Skin: Skin is warm and dry.    Assessment/Plan: CT scan from Jan  reviewed.  Pt with multiple, large masses within her abdomen and significant metastatic tumor replacement in her liver.  I do not see any way that a surgery could be attempted.  The tumors are too large to be resected safely, and I fear she does not have the reserve to tolerate this.  Unfortunately, there does not seem to be any role for surgical management at this time.  I discussed this with my senior partner as well, and he agrees that debulking her tumor would most likely shorten her lifespan due to complications associated with surgery itself.    Raylen Tangonan C. 7/94/4461, 90:12 AM

## 2015-04-12 NOTE — Telephone Encounter (Signed)
Oral Chemotherapy Follow Up  Patient was scheduled for [facility] follow up today. She is currently admitted.

## 2015-04-12 NOTE — Clinical Social Work Placement (Signed)
   CLINICAL SOCIAL WORK PLACEMENT  NOTE  Date:  04/12/2015  Patient Details  Name: Monica [name] MRN: [id number] Date of Birth: 04-14-1965  Clinical Social Work is seeking post-discharge placement for this patient at the [place] level of care (*CSW will initial, date and re-position this form in  chart as items are completed):  Yes   Patient/family provided with [place] Work Department's list of facilities offering this level of care within the geographic area requested by the patient (or if unable, by the patient's family).  Yes   Patient/family informed of their freedom to choose among providers that offer the needed level of care, that participate in Medicare, Medicaid or managed care program needed by the patient, have an available bed and are willing to accept the patient.  Yes   Patient/family informed of [place]'s ownership interest in [facility] and [facility], as well as of the fact that they are under no obligation to receive care at these facilities.  PASRR submitted to EDS on 04/11/15     PASRR number received on 04/11/15     Existing PASRR number confirmed on       FL2 transmitted to all facilities in geographic area requested by pt/family on 04/11/15     FL2 transmitted to all facilities within larger geographic area on       Patient informed that his/her managed care company has contracts with or will negotiate with certain facilities, including the following:        Yes   Patient/family informed of bed offers received.  Patient chooses bed at [facility]     Physician recommends and patient chooses bed at      Patient to be transferred to [facility] on  .  Patient to be transferred to facility by       Patient family notified on   of transfer.  Name of family member notified:        PHYSICIAN Please sign FL2     Additional Comment:     _______________________________________________ Ladell Pier, LCSW 04/12/2015, 4:31 PM

## 2015-04-13 ENCOUNTER — Telehealth: Payer: Self-pay

## 2015-04-13 ENCOUNTER — Telehealth: Payer: Self-pay | Admitting: Oncology

## 2015-04-13 ENCOUNTER — Telehealth: Payer: Self-pay | Admitting: *Deleted

## 2015-04-13 ENCOUNTER — Other Ambulatory Visit: Payer: Self-pay | Admitting: *Deleted

## 2015-04-13 ENCOUNTER — Encounter: Payer: Self-pay | Admitting: *Deleted

## 2015-04-13 DIAGNOSIS — D649 Anemia, unspecified: Secondary | ICD-10-CM

## 2015-04-13 MED ORDER — POLYETHYLENE GLYCOL 3350 17 G PO PACK: 17.0000 g | PACK | Freq: Every day | ORAL | Status: AC | PRN

## 2015-04-13 MED ORDER — FLEET ENEMA 7-19 GM/118ML RE ENEM
1.0000 | ENEMA | Freq: Once | RECTAL | Status: AC
  Administered 2015-04-13: 1 via RECTAL
  Filled 2015-04-13: qty 1

## 2015-04-13 MED ORDER — SENNOSIDES-DOCUSATE SODIUM 8.6-50 MG PO TABS: 2.0000 | ORAL_TABLET | Freq: Two times a day (BID) | ORAL | Status: AC

## 2015-04-13 MED ORDER — MORPHINE SULFATE ER 30 MG PO TBCR: 30.0000 mg | EXTENDED_RELEASE_TABLET | Freq: Two times a day (BID) | ORAL | Status: DC

## 2015-04-13 MED ORDER — SENNOSIDES-DOCUSATE SODIUM 8.6-50 MG PO TABS
2.0000 | ORAL_TABLET | Freq: Two times a day (BID) | ORAL | Status: DC
  Administered 2015-04-13: 2 via ORAL
  Filled 2015-04-13: qty 2

## 2015-04-13 MED ORDER — POLYETHYLENE GLYCOL 3350 17 G PO PACK
17.0000 g | PACK | Freq: Every day | ORAL | Status: DC
  Filled 2015-04-13: qty 1

## 2015-04-13 NOTE — Telephone Encounter (Signed)
Butch Penny from in- patient [place] calling to request that Dr. Benay Spice come up and speak to patients family today.  Lorriane Shire, RN aware.

## 2015-04-13 NOTE — Progress Notes (Signed)
Patient d/c to SNF [facility] via nonemergency ambulance. Patient is stable, pain is controlled. Had a good BM this shift.

## 2015-04-13 NOTE — Progress Notes (Signed)
[facility] -  Social Work Note  Met with patient at bedside to confirm interest in hospice following her at [facility]. She is agreeable and aware Dr. Benay Spice will be managing her hospice care. Spoke with Tammy with [facility] to confirm this plan. Hospice eligibility has been confirmed. Admission visit will be scheduled when order for hospice is received from [facility].   Thank you.  Erling Conte, [place]

## 2015-04-13 NOTE — Progress Notes (Signed)
Pt for discharge to [facility].  CSW facilitated pt discharge needs including contacting facility, faxing pt discharge information via epic hub, discussing with pt at bedside and pt family arrived from out of town, providing RN phone number to call report, and arranging ambulance transport via [facility] for pt to [facility].  Pt tearful as oncologist had just met with pt and pt family. Pt family in town for weekend to provide support.  No further social work needs identified at this time.  CSW signing off.   Alison Murray, MSW, [place] Work [phone]

## 2015-04-13 NOTE — Progress Notes (Signed)
Nursing Note: Received report that pt has refused IVFs.Pt has NSL in place and is capped off.wbb

## 2015-04-13 NOTE — Discharge Summary (Signed)
Physician Discharge Summary  Patient ID: Monica [name]  MRN: [id number] DOB/AGE: 02-19-1965 50 y.o.  Admit date: 04/09/2015 Discharge date: 04/13/2015    Discharge Diagnoses:  1. Gastrointestinal stromal tumor of the stomach-metastatic 2. 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 Anemia secondary to chronic disease, renal insufficiency, and probable GI bleeding 4. Renal insufficiency  Discharged Condition: Poor  Discharge Labs: None  Significant Diagnostic Studies: None  Consults: Dr. Leighton Ruff, surgery  Procedures: Red blood cell transfusion  Disposition: Discharge to skilled nursing facility     Medication List    STOP taking these medications        ferrous sulfate 325 (65 FE) MG tablet  Commonly known as:  FERROUSUL     glatiramer 20 MG/ML Sosy injection  Commonly known as:  COPAXONE     magic mouthwash Soln     nilotinib 200 MG capsule  Commonly known as:  TASIGNA     OVER THE COUNTER MEDICATION     potassium chloride SA 20 MEQ tablet  Commonly known as:  K-DUR,KLOR-CON      TAKE these medications        megestrol 40 MG/ML suspension  Commonly known as:  MEGACE  Take 5 mLs (200 mg total) by mouth 2 (two) times daily.     morphine 15 MG tablet  Commonly known as:  MSIR  Take 1-2 tablets (15-30 mg total) by mouth every 4 (four) hours as needed for severe pain.     morphine 30 MG 12 hr tablet  Commonly known as:  MS CONTIN  Take 1 tablet (30 mg total) by mouth every 12 (twelve) hours.     polyethylene glycol packet  Commonly known as:  MIRALAX / GLYCOLAX  Take 17 g by mouth daily as needed for mild constipation.     prochlorperazine 10 MG tablet  Commonly known as:  COMPAZINE  Take 1 tablet (10 mg total) by mouth every 6 (six) hours as needed for nausea or vomiting.     senna-docusate 8.6-50 MG tablet  Commonly known as:  Senokot-S  Take 2 tablets by mouth 2 (two) times daily.            Follow-up Information    Follow up with  [facility] .   Specialty:  [place] information:   [address] [place] [phone]      Follow up with Betsy Coder, MD.   Specialty:  Oncology   Why:  office will call with appointment time   Contact information:   [address] [place] [address] [phone]       Hospital Course: Monica [name] is a 50 year old woman with metastatic gastrointestinal stromal tumor. She has been treated with multiple systemic therapies without clinical improvement. She is symptomatic with pain secondary to abdominal carcinomatosis.   She presented to the office 04/09/2015 for routine follow-up. She was found to have severe symptomatic anemia. Performance status noted to be very poor. She was admitted for transfusion support.  She received 2 units of packed red blood cells. Follow-up CBC showed improvement in the hemoglobin.  A chest x-ray was obtained due to complaints of dyspnea and an abnormal lung exam. There were no significant findings on the chest x-ray.  For pain she received IV and oral morphine. She was started on MS Contin.  She requested consultation with surgery to see if she was a candidate for debulking. She was seen  by Dr. Leighton Ruff on [id number]. Dr. Marcello Moores felt the tumors were too large to be resected safely and also felt Monica [name] would not be able to tolerate the procedure.  Dr. Benay Spice recommended hospice care. Monica [name] is in agreement. CPR and ACLS issues were discussed. She would like to remain on [facility] [place] for now.  Monica [name] lives alone and has no family in the area. Arrangements were made for discharge to a nursing facility. She will be followed by hospice and palliative care of [facility] at the nursing facility.  We will arrange for a follow-up visit at the [facility] in the next one to 2 weeks.    Signed: Ned Card 04/13/2015, 9:15 AM   Monica [name] appears  stable for discharge to the skilled nursing facility.

## 2015-04-13 NOTE — Clinical Social Work Placement (Signed)
   CLINICAL SOCIAL WORK PLACEMENT  NOTE  Date:  04/13/2015  Patient Details  Name: Nataliah Feria MRN: [id number] Date of Birth: 1965/08/15  Clinical Social Work is seeking post-discharge placement for this patient at the [place] level of care (*CSW will initial, date and re-position this form in  chart as items are completed):  Yes   Patient/family provided with [place] Work Department's list of facilities offering this level of care within the geographic area requested by the patient (or if unable, by the patient's family).  Yes   Patient/family informed of their freedom to choose among providers that offer the needed level of care, that participate in Medicare, Medicaid or managed care program needed by the patient, have an available bed and are willing to accept the patient.  Yes   Patient/family informed of [place]'s ownership interest in [facility] and [facility], as well as of the fact that they are under no obligation to receive care at these facilities.  PASRR submitted to EDS on 04/11/15     PASRR number received on 04/11/15     Existing PASRR number confirmed on       FL2 transmitted to all facilities in geographic area requested by pt/family on 04/11/15     FL2 transmitted to all facilities within larger geographic area on       Patient informed that his/her managed care company has contracts with or will negotiate with certain facilities, including the following:        Yes   Patient/family informed of bed offers received.  Patient chooses bed at [facility]     Physician recommends and patient chooses bed at      Patient to be transferred to [facility] on 04/13/15.  Patient to be transferred to facility by ambulance [facility])     Patient family notified on 04/13/15 of transfer.  Name of family member notified:  pt notified at bedside     PHYSICIAN Please sign FL2      Additional Comment:    _______________________________________________ Ladell Pier, LCSW 04/13/2015, 1:31 PM

## 2015-04-13 NOTE — Telephone Encounter (Signed)
TC from patient requesting call back regarding her 'tumor'. She states she is expecting a call back from Dr. Benay Spice.  She is currently in patient @ [facility]

## 2015-04-13 NOTE — Progress Notes (Signed)
Received message from triage regarding pt wanting to leave [facility] "now."  Dr. Benay Spice notified.  Call placed back to pt.  Pt states that she does not like it at [facility] and wants to go home "now".  Pt instructed that Dr. Benay Spice does not want pt living alone d/t safety concerns and that she could be moved to [facility] if she desires.  Pt states that she does not want to go anywhere but "home." Pt lives alone and has no family close by.  Pt once again told that Dr. Benay Spice recommends that she not be alone.  Pt stated that she could take care of herself and that she was going to call a family member who is here visiting for the weekend to come and pick her up against recommendation of Dr. Benay Spice.  Will notify Dr. Benay Spice of above information.

## 2015-04-13 NOTE — Progress Notes (Signed)
Chaplain met with pt and family prior to discharge for support around advance directives and consent to release updates to family members.    Pt completed [place].  Chaplain notarized.  Pt with original and copies.  Copy placed in chart.    Pt gave verbal consent to share information with family present.  Wished to include family member's names in chart for nursing to be able to provide updates.    Jori Moll Wingate - Brother  [phone]  [phone]   Diaz  [phone]   Debbie Wingate - Sister  [phone]  [phone]   Andrews - Brother  [phone]

## 2015-04-13 NOTE — Progress Notes (Signed)
This nurse called at the [facility], spoke to [facility], advised her that patient's family is requesting to speak with Dr. Learta Codding. Zigmund Daniel will relay message to [facility] nurse, Lorriane Shire. Will wait for updates.

## 2015-04-13 NOTE — Progress Notes (Signed)
Spoke w/ nurse from [facility] and gave report for patient admission.

## 2015-04-13 NOTE — Progress Notes (Signed)
Pt had good results from fleet enema. Has had BM x2. No complaints of pain.

## 2015-04-13 NOTE — Telephone Encounter (Signed)
Patient called from [facility] and wanted Dr. Benay Spice to know that she "wants out"- patient states that she doesn't like it there and wants out "now"!  Writer spoke with Lorriane Shire, RN Patient just called again-she wanted me to give Lorriane Shire, RN a message to pass on to Dr. Benay Spice.  She is leaving the SNF on Monday by cab.  Lorriane Shire, RN aware of this also.

## 2015-04-13 NOTE — Telephone Encounter (Signed)
Spoke with patient to confirm appt 4/7 date/time per 3/31 pof

## 2015-04-13 NOTE — Telephone Encounter (Signed)
Call placed to pt in room 1324.  She has out of town family members that are here that would like to speak with Dr. Benay Spice regarding pt.'s condition.  Dr. Benay Spice notified and will visit pt this afternoon.  Call placed to 3W to notify them of MD visit.

## 2015-04-16 ENCOUNTER — Non-Acute Institutional Stay (SKILLED_NURSING_FACILITY): Payer: Medicaid Other | Admitting: Adult Health

## 2015-04-16 ENCOUNTER — Encounter: Payer: Self-pay | Admitting: Adult Health

## 2015-04-16 ENCOUNTER — Telehealth: Payer: Self-pay | Admitting: *Deleted

## 2015-04-16 DIAGNOSIS — G35 Multiple sclerosis: Secondary | ICD-10-CM

## 2015-04-16 DIAGNOSIS — D62 Acute posthemorrhagic anemia: Secondary | ICD-10-CM | POA: Diagnosis not present

## 2015-04-16 DIAGNOSIS — E43 Unspecified severe protein-calorie malnutrition: Secondary | ICD-10-CM | POA: Diagnosis not present

## 2015-04-16 DIAGNOSIS — C49A2 Gastrointestinal stromal tumor of stomach: Secondary | ICD-10-CM

## 2015-04-16 MED ORDER — MORPHINE SULFATE 15 MG PO TABS: 15.0000 mg | ORAL_TABLET | ORAL | Status: AC | PRN

## 2015-04-16 MED ORDER — MORPHINE SULFATE ER 30 MG PO TBCR: 30.0000 mg | EXTENDED_RELEASE_TABLET | Freq: Two times a day (BID) | ORAL | Status: AC

## 2015-04-16 NOTE — Telephone Encounter (Signed)
"  What time is my appointment on Friday?"  Provided next F/U appointment is 04-20-2015 at 2:15 pm with no lab draw before this visit.with A.P.P.  No further questions.

## 2015-04-16 NOTE — Progress Notes (Signed)
Patient ID: Monica [name], female   DOB: Nov 23, 1965, 50 y.o.   MRN: [id number]   Facility:  [facility]       No Known Allergies  Chief Complaint  Patient presents with  . Discharge Note     HPI:  She has decided to go home today. She says that she no longer wants to be at this facility. She tells me that she is able to dress herself; bathe herself; and manage the rest of her adl issues. She will need her prescriptions to be written. She will need home health for nursing.   Past Medical History  Diagnosis Date  . Hypertension   . Hyperlipidemia   . MS (multiple sclerosis) ([place])   . Low back pain   . Dyslipidemia   . Vitamin D deficiency   . Cancer ([place])   . 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 Esophagogastroduodenoscopy N/A 09/04/2012    Procedure: ESOPHAGOGASTRODUODENOSCOPY (EGD);  Surgeon: Jerene Bears, MD;  Location: [facility];  Service: Gastroenterology;  Laterality: N/A;  . Laparotomy N/A 09/07/2012    Procedure: EXPLORATORY LAPAROTOMY;  Surgeon: Leighton Ruff, MD;  Location: [facility];  Service: General;  Laterality: N/A;  . Partial gastrectomy N/A 09/07/2012    Procedure: PARTIAL GASTRECTOMY/RESECTION OF ABDOMINAL MASS;  Surgeon: Leighton Ruff, MD;  Location: [facility];  Service: General;  Laterality: N/A;    Family History  Problem Relation Age of Onset  . Stroke Mother   . Stroke Father   . Schizophrenia Brother   . Hypertension Brother   . Hypertension Brother     Social History   Social History  . Marital Status: Single    Spouse Name: N/A  . Number of Children: 1  . Years of Education: hs   Occupational History  . Not on file.   Social History Main Topics  . Smoking status: Former Smoker    Quit date: 01/09/2002  . Smokeless tobacco: Never Used  . Alcohol Use: No  . Drug Use: No  . Sexual Activity: Not Currently   Other Topics Concern  . Not on file   Social History  Narrative   Patient is single and lives with son.   Patient son has educational needs.    Patient does not work.    Patient has a high school education    Patient quit smoking in 2005.   Patient also quit drinking.             VITAL SIGNS BP 110/68 mmHg  Pulse 72  Temp(Src) 97.6 F (36.4 C) (Oral)  Resp 20  Ht 5\' 8"  (1.727 m)  Wt 172 lb (78.019 kg)  BMI 26.16 kg/m2  SpO2 98%  Patient's Medications  New Prescriptions   No medications on file  Previous Medications   MEGESTROL (MEGACE) 40 MG/ML SUSPENSION    Take 5 mLs (200 mg total) by mouth 2 (two) times daily.   MORPHINE (MS CONTIN) 30 MG 12 HR TABLET    Take 1 tablet (30 mg total) by mouth every 12 (twelve) hours.   MORPHINE (MSIR) 15 MG TABLET    Take 1-2 tablets (15-30 mg total) by mouth every 4 (four) hours as needed for severe pain.   POLYETHYLENE GLYCOL (MIRALAX / GLYCOLAX) PACKET    Take 17 g by mouth daily as needed for mild constipation.   PROCHLORPERAZINE (COMPAZINE) 10 MG TABLET    Take 1 tablet (10 mg  total) by mouth every 6 (six) hours as needed for nausea or vomiting.   SENNA-DOCUSATE (SENOKOT-S) 8.6-50 MG TABLET    Take 2 tablets by mouth 2 (two) times daily.  Modified Medications   No medications on file  Discontinued Medications   No medications on file     SIGNIFICANT DIAGNOSTIC EXAMS  04-09-15: chest x-ray: Low lung volumes with bibasilar atelectasis    LABS REVIEWED:   04-09-15; wbc 15.3 hgb 6.8; hct 22.1; mcv 84.2; plt 405; glucose 95; bun 13.4; creat 1.8; k+ 4.5; na++138; liver normal albumin 2.8 04-10-15: wbc 15.0; hgb 7.9; hct 24.3 ;mcv 84.7; plt 377    Review of Systems  Constitutional: Negative for malaise/fatigue.  Respiratory: Negative for cough and shortness of breath.   Cardiovascular: Negative for chest pain, palpitations and leg swelling.  Gastrointestinal: Negative for heartburn, abdominal pain and constipation.  Musculoskeletal: Negative for myalgias, back pain and joint pain.    Skin: Negative.   Neurological: Negative for dizziness.  Psychiatric/Behavioral: The patient is not nervous/anxious.     Physical Exam  Constitutional: She is oriented to person, place, and time. No distress.  Frail   Eyes: Conjunctivae are normal.  Neck: Neck supple. No JVD present. No thyromegaly present.  Cardiovascular: Normal rate, regular rhythm and intact distal pulses.   Respiratory: Effort normal and breath sounds normal. No respiratory distress. She has no wheezes.  GI: Soft. Bowel sounds are normal. She exhibits distension. There is no tenderness.  Musculoskeletal: She exhibits no edema.  Able to move all extremities   Lymphadenopathy:    She has no cervical adenopathy.  Neurological: She is alert and oriented to person, place, and time.  Skin: Skin is warm and dry. She is not diaphoretic.  Psychiatric: She has a normal mood and affect.       ASSESSMENT/ PLAN:  Will discharge her to home per her request; will need home health for rn to evaluate and treat as indicated for medication management. Her prescriptions have been written for a 30 day supply of her medications with #30 ms contin 30 mg tabs; and #30 msir 15 mg tabs. She will continue to follow up medically with oncology.    Time spent with patient  40  minutes >50% time spent counseling; reviewing medical record; tests; labs; and developing future plan of care   Ok Edwards NP [facility]  Contact [phone] Monday through Friday 8am- 5pm  After hours call [phone]

## 2015-04-20 ENCOUNTER — Ambulatory Visit: Payer: Medicaid Other | Admitting: Nurse Practitioner

## 2015-04-20 ENCOUNTER — Telehealth: Payer: Self-pay | Admitting: *Deleted

## 2015-04-20 MED ORDER — LORAZEPAM 0.5 MG PO TABS: 0.5000 mg | ORAL_TABLET | Freq: Three times a day (TID) | ORAL | Status: AC

## 2015-04-20 NOTE — Telephone Encounter (Signed)
Message from [place], [facility] RN requesting order for Lorazepam to help with pt's feeling of shortness of breath. Per Patty, SpO2 has been within normal limits and pt "does not qualify for Oxygen."  Pt also has severely dry mouth making it difficult to speak. Asking if something can be called in for this as well.  Per Patty, pt did not know today was Friday, so she will not make it to office visit appt.  Reviewed with Dr. Benay Spice: Order received for Ativan 0.5 mg Q 6 hours PRN. Per Dr. Benay Spice: Pt's MSIR should help this as well. Orders called to pharmacy, delivery requested. Patty made aware.

## 2015-04-25 ENCOUNTER — Encounter: Payer: Self-pay | Admitting: *Deleted

## 2015-04-25 NOTE — Progress Notes (Signed)
Call received from [facility] stating that pt is requesting DNR status and transfer to [facility].  Will inform Dr. Benay Spice.

## 2015-04-26 ENCOUNTER — Encounter: Payer: Self-pay | Admitting: *Deleted

## 2015-04-26 ENCOUNTER — Other Ambulatory Visit: Payer: Self-pay | Admitting: *Deleted

## 2015-04-26 ENCOUNTER — Telehealth: Payer: Self-pay | Admitting: Oncology

## 2015-04-26 NOTE — Telephone Encounter (Signed)
CX pt appts-pt deceased

## 2015-04-26 NOTE — Progress Notes (Signed)
Call received from [facility] to notify Dr. Benay Spice that pt has expired.  Will inform Dr. Benay Spice of pt.'s passing.

## 2015-04-26 NOTE — Progress Notes (Signed)
Call received from Patty at [facility] to inform Dr. Benay Spice that pt has expired.  Will inform Dr. Benay Spice.

## 2015-04-30 ENCOUNTER — Other Ambulatory Visit: Payer: Medicaid Other

## 2015-04-30 ENCOUNTER — Ambulatory Visit: Payer: Medicaid Other | Admitting: Oncology

## 2015-05-03 ENCOUNTER — Telehealth: Payer: Self-pay | Admitting: Oncology

## 2015-05-03 NOTE — Telephone Encounter (Signed)
Contacted [employer] regarding the death certificate ready for pick up and faxed per their request

## 2015-05-04 ENCOUNTER — Telehealth: Payer: Self-pay | Admitting: Oncology

## 2015-05-04 NOTE — Telephone Encounter (Signed)
Death certificate received in HIM. Patient status changed to deceased. °

## 2015-05-14 DEATH — deceased

## 2015-12-26 IMAGING — MG MM SCREEN MAMMOGRAM BILATERAL
7 series · 7 of 7 positions shown · non-contrast
Comparison: Previous exam(s).

ADDENDUM:
patient returns today after technical callback after original
screening mammogram 06/19/2014. There is no evidence for malignancy
in either breast.
CLINICAL DATA: Screening.

[R CC]
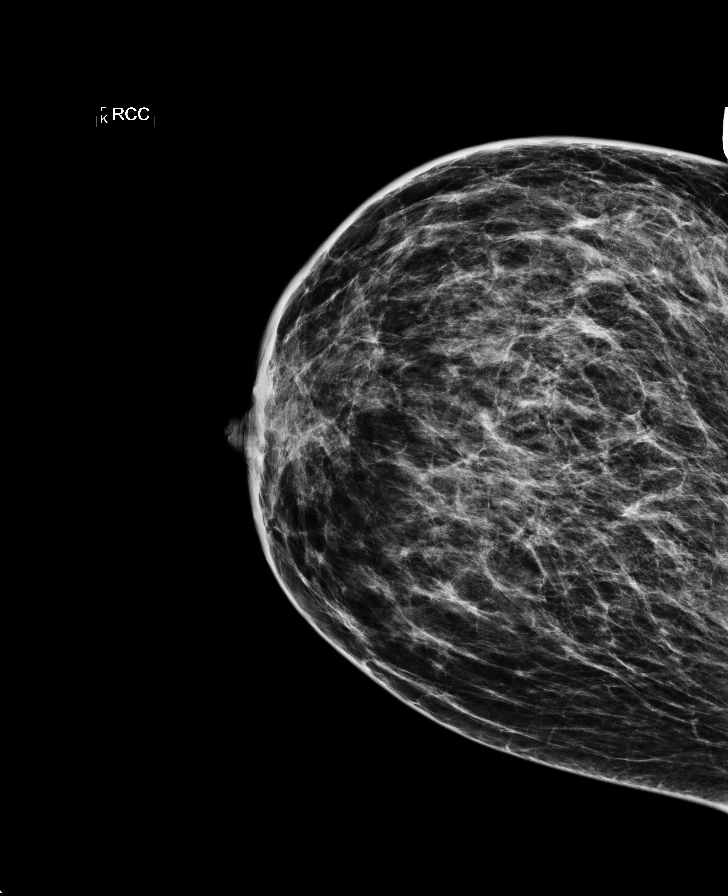

[L CC]
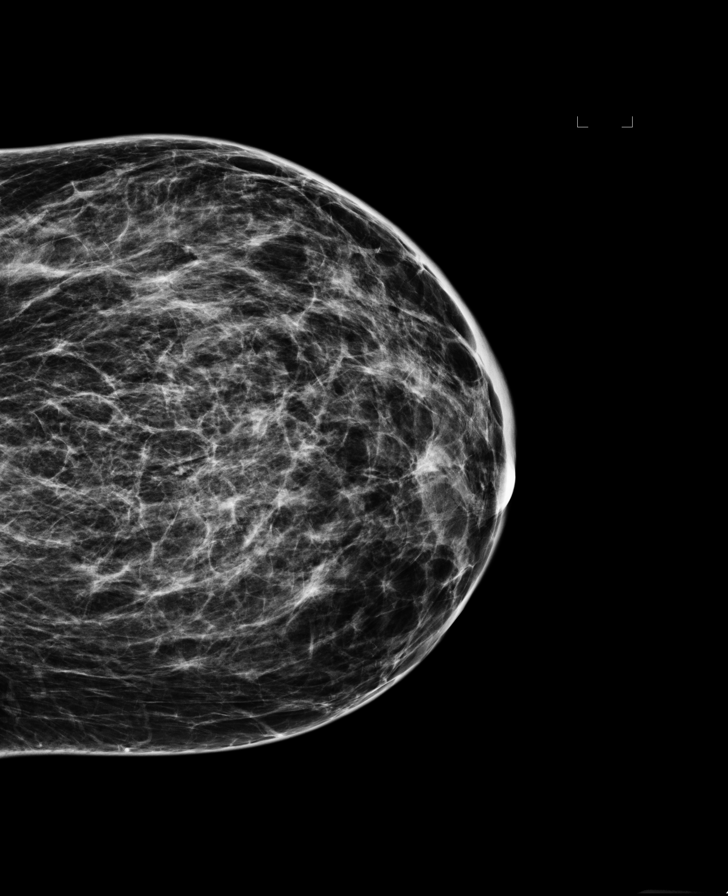

[L MLO (1 of 2)]
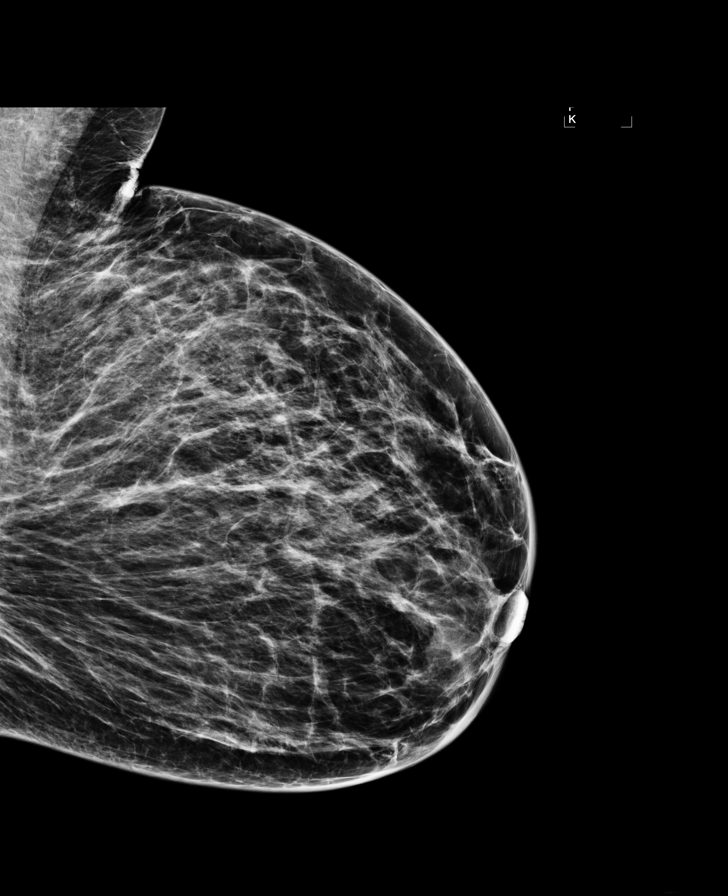

[R MLO (1 of 2)]
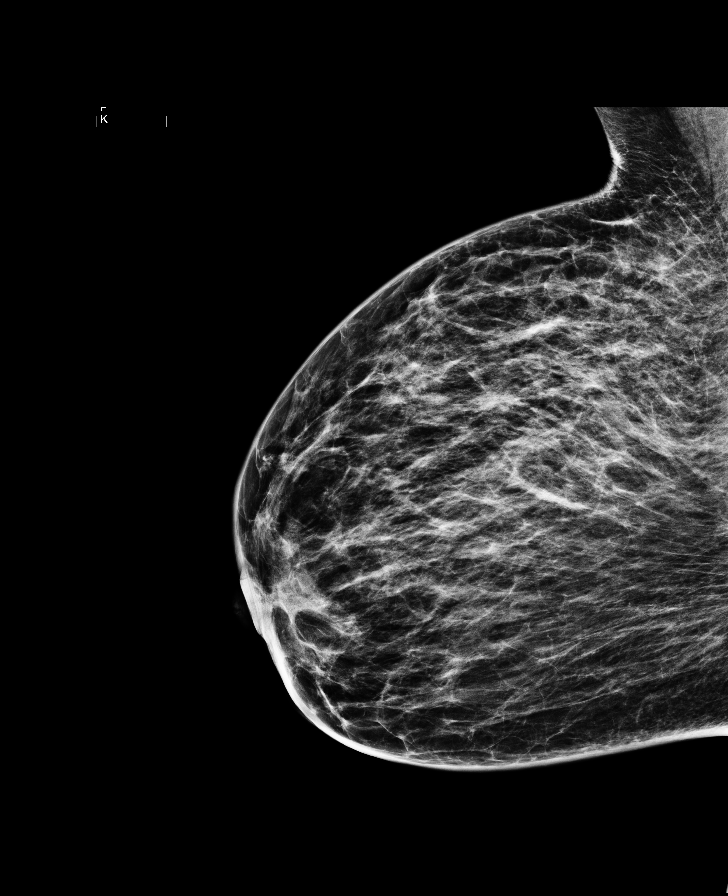

[R XCCL]
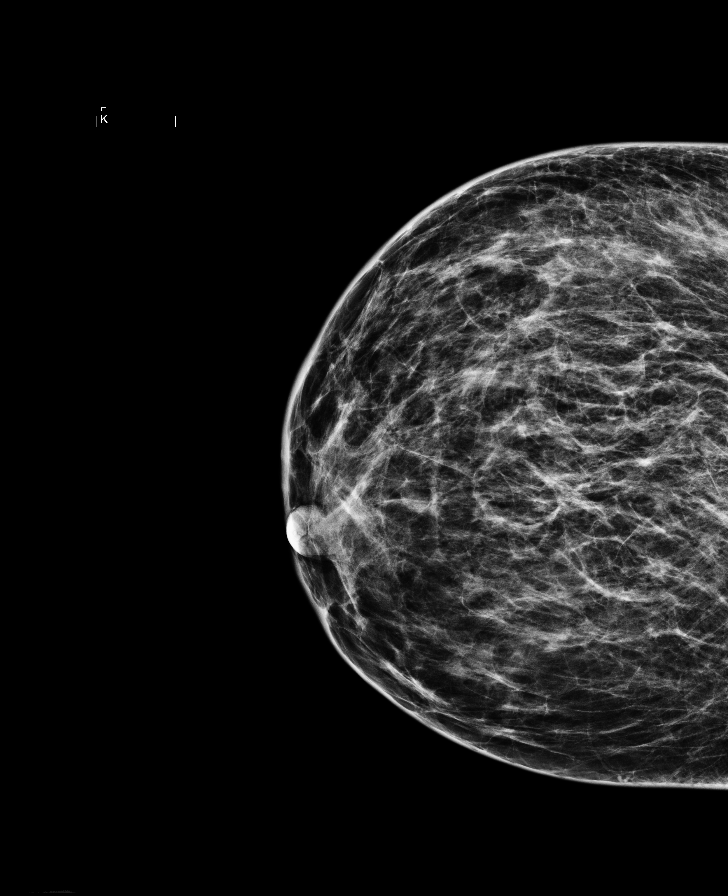

[R MLO (2 of 2)]
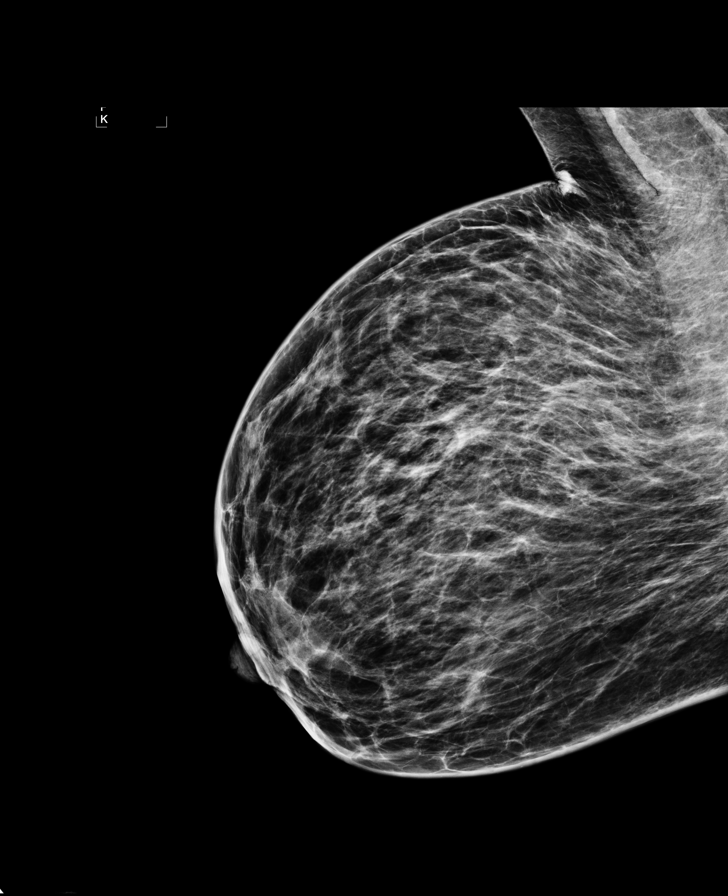

[L MLO (2 of 2)]
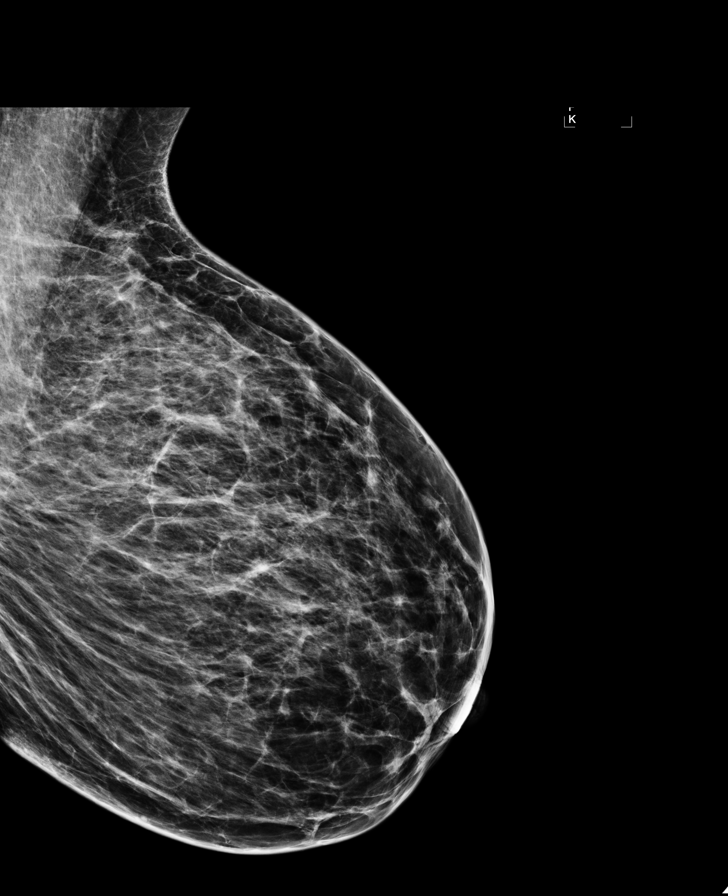

[7 of 7 positions shown; findings below may reference images not displayed]

IMPRESSION: No mammographic evidence for malignancy in either breast.

BI-RADS 1: Negative.

Recommendation: Screening mammogram in one year.(Code:E2-4-0IJ)
ACR Breast Density Category c: The breast tissue is heterogeneously
dense, which may
FINDINGS: Because of technical reasons, a portion of the exam needs to be
repeated or performed. We have attempted to contact the patient to
return for these views, but at this time the patient has not
returned to complete the exam.
IMPRESSION: Completion of the screening exam is recommended.

RECOMMENDATION:
Additional mammographic views.

BI-RADS CATEGORY  0: Incomplete. Need additional imaging Send
evaluation and/or prior mammograms for comparison.
